# Patient Record
Sex: Female | Born: 1950 | Race: White | Hispanic: No | Smoking: Never smoker
Health system: Southern US, Community
[De-identification: ages and names within clinical notes are randomized; demographics above are authoritative.]

## PROBLEM LIST (undated history)

## (undated) DIAGNOSIS — E785 Hyperlipidemia, unspecified: Secondary | ICD-10-CM

## (undated) DIAGNOSIS — Z9221 Personal history of antineoplastic chemotherapy: Secondary | ICD-10-CM

## (undated) DIAGNOSIS — R112 Nausea with vomiting, unspecified: Secondary | ICD-10-CM

## (undated) DIAGNOSIS — M6530 Trigger finger, unspecified finger: Secondary | ICD-10-CM

## (undated) DIAGNOSIS — R519 Headache, unspecified: Secondary | ICD-10-CM

## (undated) DIAGNOSIS — R7303 Prediabetes: Secondary | ICD-10-CM

## (undated) DIAGNOSIS — C50919 Malignant neoplasm of unspecified site of unspecified female breast: Secondary | ICD-10-CM

## (undated) DIAGNOSIS — R42 Dizziness and giddiness: Secondary | ICD-10-CM

## (undated) DIAGNOSIS — T8859XA Other complications of anesthesia, initial encounter: Secondary | ICD-10-CM

## (undated) DIAGNOSIS — T4145XA Adverse effect of unspecified anesthetic, initial encounter: Secondary | ICD-10-CM

## (undated) DIAGNOSIS — C50412 Malignant neoplasm of upper-outer quadrant of left female breast: Secondary | ICD-10-CM

## (undated) DIAGNOSIS — Z923 Personal history of irradiation: Secondary | ICD-10-CM

## (undated) DIAGNOSIS — I10 Essential (primary) hypertension: Secondary | ICD-10-CM

## (undated) HISTORY — DX: Trigger finger, unspecified finger: M65.30

## (undated) HISTORY — PX: DILATION AND CURETTAGE OF UTERUS: SHX78

## (undated) HISTORY — PX: THYROIDECTOMY, PARTIAL: SHX18

## (undated) HISTORY — PX: OTHER SURGICAL HISTORY: SHX169

## (undated) HISTORY — PX: TRIGGER FINGER RELEASE: SHX641

## (undated) HISTORY — PX: OOPHORECTOMY: SHX86

## (undated) HISTORY — PX: BREAST CYST ASPIRATION: SHX578

## (undated) HISTORY — PX: CHOLECYSTECTOMY: SHX55

---
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
## 1985-11-23 DIAGNOSIS — R112 Nausea with vomiting, unspecified: Secondary | ICD-10-CM

## 1985-11-23 DIAGNOSIS — Z9889 Other specified postprocedural states: Secondary | ICD-10-CM

## 1985-11-23 HISTORY — DX: Other specified postprocedural states: R11.2

## 1985-11-23 HISTORY — DX: Other specified postprocedural states: Z98.890

## 2002-11-23 HISTORY — PX: ABDOMINAL HYSTERECTOMY: SHX81

## 2005-04-14 ENCOUNTER — Ambulatory Visit: Payer: Self-pay | Admitting: Gastroenterology

## 2005-09-22 ENCOUNTER — Ambulatory Visit: Payer: Self-pay | Admitting: Unknown Physician Specialty

## 2006-10-01 ENCOUNTER — Ambulatory Visit: Payer: Self-pay | Admitting: Unknown Physician Specialty

## 2007-09-14 ENCOUNTER — Ambulatory Visit: Payer: Self-pay | Admitting: Unknown Physician Specialty

## 2007-10-13 ENCOUNTER — Ambulatory Visit: Payer: Self-pay | Admitting: Unknown Physician Specialty

## 2008-10-16 ENCOUNTER — Ambulatory Visit: Payer: Self-pay | Admitting: Unknown Physician Specialty

## 2009-10-24 ENCOUNTER — Ambulatory Visit: Payer: Self-pay | Admitting: Unknown Physician Specialty

## 2010-08-07 ENCOUNTER — Ambulatory Visit: Payer: Self-pay | Admitting: Gastroenterology

## 2010-08-08 LAB — PATHOLOGY REPORT

## 2010-11-06 ENCOUNTER — Ambulatory Visit: Payer: Self-pay | Admitting: Unknown Physician Specialty

## 2011-11-23 ENCOUNTER — Ambulatory Visit: Payer: Self-pay | Admitting: Unknown Physician Specialty

## 2013-07-12 ENCOUNTER — Ambulatory Visit: Payer: Self-pay | Admitting: Dermatology

## 2013-08-15 DIAGNOSIS — Z860100 Personal history of colon polyps, unspecified: Secondary | ICD-10-CM | POA: Insufficient documentation

## 2014-10-01 DIAGNOSIS — E785 Hyperlipidemia, unspecified: Secondary | ICD-10-CM | POA: Insufficient documentation

## 2014-10-01 DIAGNOSIS — E1169 Type 2 diabetes mellitus with other specified complication: Secondary | ICD-10-CM | POA: Insufficient documentation

## 2014-10-01 DIAGNOSIS — N182 Chronic kidney disease, stage 2 (mild): Secondary | ICD-10-CM | POA: Insufficient documentation

## 2014-10-01 DIAGNOSIS — I152 Hypertension secondary to endocrine disorders: Secondary | ICD-10-CM | POA: Insufficient documentation

## 2014-10-01 DIAGNOSIS — E1122 Type 2 diabetes mellitus with diabetic chronic kidney disease: Secondary | ICD-10-CM | POA: Insufficient documentation

## 2014-10-01 DIAGNOSIS — E1159 Type 2 diabetes mellitus with other circulatory complications: Secondary | ICD-10-CM | POA: Insufficient documentation

## 2015-04-08 DIAGNOSIS — Z79899 Other long term (current) drug therapy: Secondary | ICD-10-CM | POA: Insufficient documentation

## 2015-10-11 DIAGNOSIS — M170 Bilateral primary osteoarthritis of knee: Secondary | ICD-10-CM | POA: Insufficient documentation

## 2015-10-11 HISTORY — DX: Morbid (severe) obesity due to excess calories: E66.01

## 2016-12-22 ENCOUNTER — Other Ambulatory Visit: Payer: Self-pay | Admitting: Obstetrics and Gynecology

## 2016-12-22 DIAGNOSIS — Z1231 Encounter for screening mammogram for malignant neoplasm of breast: Secondary | ICD-10-CM

## 2016-12-31 ENCOUNTER — Encounter: Payer: Self-pay | Admitting: Radiology

## 2016-12-31 ENCOUNTER — Ambulatory Visit
Admission: RE | Admit: 2016-12-31 | Discharge: 2016-12-31 | Disposition: A | Discharge status: Home / Self Care | Payer: Medicare Other | Source: Ambulatory Visit | Attending: Obstetrics and Gynecology | Admitting: Obstetrics and Gynecology

## 2016-12-31 DIAGNOSIS — Z1231 Encounter for screening mammogram for malignant neoplasm of breast: Secondary | ICD-10-CM | POA: Diagnosis not present

## 2017-01-06 ENCOUNTER — Inpatient Hospital Stay
Admission: RE | Admit: 2017-01-06 | Discharge: 2017-01-06 | Disposition: A | Discharge status: Home / Self Care | Payer: Self-pay | Source: Ambulatory Visit | Attending: *Deleted | Admitting: *Deleted

## 2017-01-06 ENCOUNTER — Other Ambulatory Visit: Payer: Self-pay | Admitting: *Deleted

## 2017-01-06 DIAGNOSIS — Z9289 Personal history of other medical treatment: Secondary | ICD-10-CM

## 2017-04-08 DIAGNOSIS — N2889 Other specified disorders of kidney and ureter: Secondary | ICD-10-CM | POA: Insufficient documentation

## 2017-04-08 DIAGNOSIS — N182 Chronic kidney disease, stage 2 (mild): Secondary | ICD-10-CM

## 2017-04-08 HISTORY — DX: Chronic kidney disease, stage 2 (mild): N18.2

## 2017-07-31 ENCOUNTER — Emergency Department: Payer: Medicare Other

## 2017-07-31 ENCOUNTER — Emergency Department
Admission: EM | Admit: 2017-07-31 | Discharge: 2017-07-31 | Disposition: A | Discharge status: Home / Self Care | Payer: Medicare Other | Attending: Emergency Medicine | Admitting: Emergency Medicine

## 2017-07-31 DIAGNOSIS — Y92015 Private garage of single-family (private) house as the place of occurrence of the external cause: Secondary | ICD-10-CM | POA: Diagnosis not present

## 2017-07-31 DIAGNOSIS — S066X0A Traumatic subarachnoid hemorrhage without loss of consciousness, initial encounter: Secondary | ICD-10-CM | POA: Insufficient documentation

## 2017-07-31 DIAGNOSIS — Z7982 Long term (current) use of aspirin: Secondary | ICD-10-CM | POA: Diagnosis not present

## 2017-07-31 DIAGNOSIS — Z79899 Other long term (current) drug therapy: Secondary | ICD-10-CM | POA: Diagnosis not present

## 2017-07-31 DIAGNOSIS — W010XXA Fall on same level from slipping, tripping and stumbling without subsequent striking against object, initial encounter: Secondary | ICD-10-CM | POA: Insufficient documentation

## 2017-07-31 DIAGNOSIS — Y9389 Activity, other specified: Secondary | ICD-10-CM | POA: Diagnosis not present

## 2017-07-31 DIAGNOSIS — I10 Essential (primary) hypertension: Secondary | ICD-10-CM | POA: Diagnosis not present

## 2017-07-31 DIAGNOSIS — S0990XA Unspecified injury of head, initial encounter: Secondary | ICD-10-CM | POA: Diagnosis present

## 2017-07-31 DIAGNOSIS — Y999 Unspecified external cause status: Secondary | ICD-10-CM | POA: Insufficient documentation

## 2017-07-31 DIAGNOSIS — W19XXXA Unspecified fall, initial encounter: Secondary | ICD-10-CM

## 2017-07-31 HISTORY — DX: Essential (primary) hypertension: I10

## 2017-07-31 HISTORY — DX: Hyperlipidemia, unspecified: E78.5

## 2017-07-31 LAB — PROTIME-INR
INR: 0.96
PROTHROMBIN TIME: 12.7 s (ref 11.4–15.2)

## 2017-07-31 LAB — CBC
HCT: 36.1 % (ref 35.0–47.0)
Hemoglobin: 13.1 g/dL (ref 12.0–16.0)
MCH: 30.6 pg (ref 26.0–34.0)
MCHC: 36.2 g/dL — ABNORMAL HIGH (ref 32.0–36.0)
MCV: 84.5 fL (ref 80.0–100.0)
Platelets: 308 10*3/uL (ref 150–440)
RBC: 4.28 MIL/uL (ref 3.80–5.20)
RDW: 13.4 % (ref 11.5–14.5)
WBC: 12.2 10*3/uL — AB (ref 3.6–11.0)

## 2017-07-31 LAB — BASIC METABOLIC PANEL
ANION GAP: 9 (ref 5–15)
BUN: 17 mg/dL (ref 6–20)
CO2: 26 mmol/L (ref 22–32)
Calcium: 9.7 mg/dL (ref 8.9–10.3)
Chloride: 101 mmol/L (ref 101–111)
Creatinine, Ser: 0.96 mg/dL (ref 0.44–1.00)
GFR calc Af Amer: >60 mL/min (ref >60)
GFR, EST NON AFRICAN AMERICAN: 59 mL/min — AB (ref >60)
Glucose, Bld: 138 mg/dL — ABNORMAL HIGH (ref 65–99)
POTASSIUM: 3.7 mmol/L (ref 3.5–5.1)
SODIUM: 136 mmol/L (ref 135–145)

## 2017-07-31 LAB — APTT: APTT: 31 s (ref 24–36)

## 2017-07-31 NOTE — ED Notes (Signed)
Transfer paperwork reviewed and is in order. EMS on scene.

## 2017-07-31 NOTE — ED Notes (Signed)
MD at bedside. 

## 2017-07-31 NOTE — ED Triage Notes (Signed)
Patient had mechanical fall today, hit posterior head. Patient has hematoma and abrasion to posterior head. Pt denies LOC, N/V, dizziness.

## 2017-07-31 NOTE — ED Notes (Signed)
Called [facility] Charge RN Hayes, informed her that patient leaving ED now and on the way to [facility] by [facility]

## 2017-07-31 NOTE — ED Provider Notes (Addendum)
[facility] Emergency Department Provider Note  ____________________________________________  Time seen: Approximately 8:14 PM  I have reviewed the triage vital signs and the nursing notes.   HISTORY  Chief Complaint Fall (hematoma posterior head)   HPI Amanda Wade is a 66 y.o. female with a history of hypertension and hyperlipidemia who presents for evaluation of head trauma. Patient was wearing her crocs getting into the garage from her car, the floor is wet because the straining she slipped and fell backwards hitting her head on the ground. She denies LOC. She does report a brief episode of vertigo right after her fall. She is on an aspirin but no other blood thinners. She is complaining of moderate pain located in the back of her head that has been constant and nonradiating. She denies neck pain, back pain, chest pain, shortness of breath, extremity pain, abdominal pain.  Past Medical History:  Diagnosis Date  . Hyperlipidemia   . Hypertension     There are no active problems to display for this patient.   Past Surgical History:  Procedure Laterality Date  . ABDOMINAL HYSTERECTOMY  2004   complete  . BREAST CYST ASPIRATION Bilateral    neg  . CHOLECYSTECTOMY    . OOPHORECTOMY      Prior to Admission medications   Medication Sig Start Date End Date Taking? Authorizing Provider  Atorvastatin Calcium (LIPITOR PO) Take by mouth.   Yes [provider]  LISINOPRIL PO Take by mouth.   Yes [provider]    Allergies Sulfa antibiotics  Family History  Problem Relation Age of Onset  . Breast cancer Cousin        mat cousin    Social History Social History  Substance Use Topics  . Smoking status: Never Smoker  . Smokeless tobacco: Never Used  . Alcohol use No    Review of Systems Constitutional: Negative for fever. Eyes: Negative for visual changes. ENT: Negative for facial injury or neck  injury Cardiovascular: Negative for chest injury. Respiratory: Negative for shortness of breath. Negative for chest wall injury. Gastrointestinal: Negative for abdominal pain or injury. Genitourinary: Negative for dysuria. Musculoskeletal: Negative for back injury, negative for arm or leg pain. Skin: Negative for laceration/abrasions. Neurological: + head injury.   ____________________________________________   PHYSICAL EXAM:  VITAL SIGNS: ED Triage Vitals  Enc Vitals Group     BP 07/31/17 1927 (!) 152/62     Pulse Rate 07/31/17 1927 83     Resp 07/31/17 1927 17     Temp 07/31/17 1927 99.5 F (37.5 C)     Temp Source 07/31/17 1927 Oral     SpO2 07/31/17 1927 97 %     Weight 07/31/17 1927 179 lb (81.2 kg)     Height 07/31/17 1927 5' 1"  (1.549 m)     Head Circumference --      Peak Flow --      Pain Score 07/31/17 1926 2     Pain Loc --      Pain Edu? --      Excl. in [place]? --    Constitutional: Alert and oriented. No acute distress. Does not appear intoxicated. HEENT Head: Normocephalic and Left parietal hematoma Face: No facial bony tenderness. Stable midface Ears: No hemotympanum bilaterally. No Battle sign Eyes: No eye injury. PERRL. No raccoon eyes Nose: Nontender. No epistaxis. No rhinorrhea Mouth/Throat: Mucous membranes are moist. No oropharyngeal blood. No dental injury. Airway patent without stridor. Normal voice. Neck:  no C-collar in place. No midline c-spine tenderness.  Cardiovascular: Normal rate, regular rhythm. Normal and symmetric distal pulses are present in all extremities. Pulmonary/Chest: Chest wall is stable and nontender to palpation/compression. Normal respiratory effort. Breath sounds are normal. No crepitus.  Abdominal: Soft, nontender, non distended. Musculoskeletal: Nontender with normal full range of motion in all extremities. No deformities. No thoracic or lumbar midline spinal tenderness. Pelvis is stable. Skin: Skin is warm, dry and intact.  No abrasions or contutions. Psychiatric: Speech and behavior are appropriate. Neurological: Normal speech and language. Moves all extremities to command. No gross focal neurologic deficits are appreciated.  Glascow Coma Score: 4 - Opens eyes on own 6 - Follows simple motor commands 5 - Alert and oriented GCS: 15   ____________________________________________   LABS (all labs ordered are listed, but only abnormal results are displayed)  Labs Reviewed  CBC  BASIC METABOLIC PANEL  PROTIME-INR  APTT   ____________________________________________  EKG  ED ECG REPORT I, Rudene Re, the attending physician, personally viewed and interpreted this ECG.  Normal sinus rhythm, rate of 76, normal intervals, left axis deviation, no ST elevations or depressions.  ____________________________________________  RADIOLOGY  CT head and cspine:  1. Moderate left posterior parietal scalp hematoma. No underlying skull fracture, however there is a small amount of subarachnoid hemorrhage within the left parietal lobe near the vertex. 2. Mild atrophy 3. Straightening of the cervical spine with degenerative changes. No acute fracture ____________________________________________   PROCEDURES  Procedure(s) performed: None Procedures Critical Care performed: yes  CRITICAL CARE Performed by: Rudene Re  ?  Total critical care time: 40 min  Critical care time was exclusive of separately billable procedures and treating other patients.  Critical care was necessary to treat or prevent imminent or life-threatening deterioration.  Critical care was time spent personally by me on the following activities: development of treatment plan with patient and/or surrogate as well as nursing, discussions with consultants, evaluation of patient's response to treatment, examination of patient, obtaining history from patient or surrogate, ordering and performing treatments and interventions,  ordering and review of laboratory studies, ordering and review of radiographic studies, pulse oximetry and re-evaluation of patient's condition.  ____________________________________________   INITIAL IMPRESSION / ASSESSMENT AND PLAN / ED COURSE   66 y.o. female with a history of hypertension and hyperlipidemia who presents for evaluation of head trauma after slipping on wet floor and hitting her head in the back. GCS 15, large parietal hematoma with overlying abrasion but no laceration. No midline cspine ttp. CT head concerning for small amount of SAH. Will call [facility] for transfer.    _________________________ 8:32 PM on 07/31/2017 -----------------------------------------  Patient accepted at [facility] by Dr. Marcos Eke, neurosurgeon on call. Patient remains with a GCS of 15, blood pressure of 123/70 not requiring any IV medication at this time. Head of the bed elevated. Patient will be monitored carefully until and EMS is here for transportation.  Pertinent labs & imaging results that were available during my care of the patient were reviewed by me and considered in my medical decision making (see chart for details).    ____________________________________________   FINAL CLINICAL IMPRESSION(S) / ED DIAGNOSES  Final diagnoses:  Fall, initial encounter  Subarachnoid hemorrhage following injury, no loss of consciousness, initial encounter ([place])      NEW MEDICATIONS STARTED DURING THIS VISIT:  New Prescriptions   No medications on file     Note:  This document was prepared using Dragon voice recognition software and  may include unintentional dictation errors.    Rudene Re, MD 07/31/17 2033    Rudene Re, MD 07/31/17 [phone]

## 2017-11-09 DIAGNOSIS — M19071 Primary osteoarthritis, right ankle and foot: Secondary | ICD-10-CM | POA: Insufficient documentation

## 2018-01-05 ENCOUNTER — Other Ambulatory Visit: Payer: Self-pay | Admitting: Obstetrics & Gynecology

## 2018-01-05 DIAGNOSIS — Z1239 Encounter for other screening for malignant neoplasm of breast: Secondary | ICD-10-CM

## 2018-01-10 ENCOUNTER — Ambulatory Visit
Admission: RE | Admit: 2018-01-10 | Discharge: 2018-01-10 | Disposition: A | Discharge status: Home / Self Care | Payer: Medicare Other | Source: Ambulatory Visit | Attending: Obstetrics & Gynecology | Admitting: Obstetrics & Gynecology

## 2018-01-10 DIAGNOSIS — Z1239 Encounter for other screening for malignant neoplasm of breast: Secondary | ICD-10-CM

## 2018-01-10 DIAGNOSIS — Z1231 Encounter for screening mammogram for malignant neoplasm of breast: Secondary | ICD-10-CM | POA: Insufficient documentation

## 2018-08-01 ENCOUNTER — Encounter: Payer: Self-pay | Admitting: *Deleted

## 2018-08-02 ENCOUNTER — Ambulatory Visit: Payer: Medicare Other | Admitting: Anesthesiology

## 2018-08-02 ENCOUNTER — Encounter
Admission: RE | Disposition: A | Discharge status: Home / Self Care | Payer: Self-pay | Source: Ambulatory Visit | Attending: Gastroenterology

## 2018-08-02 ENCOUNTER — Encounter: Payer: Self-pay | Admitting: Anesthesiology

## 2018-08-02 ENCOUNTER — Ambulatory Visit
Admission: RE | Admit: 2018-08-02 | Discharge: 2018-08-02 | Disposition: A | Discharge status: Home / Self Care | Payer: Medicare Other | Source: Ambulatory Visit | Attending: Gastroenterology | Admitting: Gastroenterology

## 2018-08-02 DIAGNOSIS — Z882 Allergy status to sulfonamides status: Secondary | ICD-10-CM | POA: Insufficient documentation

## 2018-08-02 DIAGNOSIS — Z86010 Personal history of colonic polyps: Secondary | ICD-10-CM | POA: Insufficient documentation

## 2018-08-02 DIAGNOSIS — I10 Essential (primary) hypertension: Secondary | ICD-10-CM | POA: Insufficient documentation

## 2018-08-02 DIAGNOSIS — Z800 Family history of malignant neoplasm of digestive organs: Secondary | ICD-10-CM | POA: Diagnosis not present

## 2018-08-02 DIAGNOSIS — Z79899 Other long term (current) drug therapy: Secondary | ICD-10-CM | POA: Diagnosis not present

## 2018-08-02 DIAGNOSIS — E785 Hyperlipidemia, unspecified: Secondary | ICD-10-CM | POA: Diagnosis not present

## 2018-08-02 DIAGNOSIS — K5730 Diverticulosis of large intestine without perforation or abscess without bleeding: Secondary | ICD-10-CM | POA: Insufficient documentation

## 2018-08-02 DIAGNOSIS — D123 Benign neoplasm of transverse colon: Secondary | ICD-10-CM | POA: Diagnosis not present

## 2018-08-02 DIAGNOSIS — Z7982 Long term (current) use of aspirin: Secondary | ICD-10-CM | POA: Insufficient documentation

## 2018-08-02 HISTORY — PX: COLONOSCOPY WITH PROPOFOL: SHX5780

## 2018-08-02 SURGERY — COLONOSCOPY WITH PROPOFOL
Anesthesia: General

## 2018-08-02 MED ORDER — SODIUM CHLORIDE 0.9 % IV SOLN
INTRAVENOUS | Status: DC
  Administered 2018-08-02: 1000 mL via INTRAVENOUS

## 2018-08-02 MED ORDER — LIDOCAINE HCL (CARDIAC) PF 100 MG/5ML IV SOSY
PREFILLED_SYRINGE | INTRAVENOUS | Status: DC | PRN
  Administered 2018-08-02: 100 mg via INTRAVENOUS

## 2018-08-02 MED ORDER — PROPOFOL 500 MG/50ML IV EMUL
INTRAVENOUS | Status: DC | PRN
  Administered 2018-08-02: 150 ug/kg/min via INTRAVENOUS

## 2018-08-02 MED ORDER — LIDOCAINE HCL (PF) 1 % IJ SOLN
2.0000 mL | Freq: Once | INTRAMUSCULAR | Status: AC
  Administered 2018-08-02: 0.3 mL via INTRADERMAL

## 2018-08-02 MED ORDER — LIDOCAINE HCL (PF) 2 % IJ SOLN
INTRAMUSCULAR | Status: AC
  Filled 2018-08-02: qty 10

## 2018-08-02 MED ORDER — LIDOCAINE HCL (PF) 1 % IJ SOLN
INTRAMUSCULAR | Status: AC
  Administered 2018-08-02: 0.3 mL via INTRADERMAL
  Filled 2018-08-02: qty 2

## 2018-08-02 MED ORDER — PROPOFOL 10 MG/ML IV BOLUS
INTRAVENOUS | Status: DC | PRN
  Administered 2018-08-02: 90 mg via INTRAVENOUS

## 2018-08-02 MED ORDER — PROPOFOL 500 MG/50ML IV EMUL
INTRAVENOUS | Status: AC
  Filled 2018-08-02: qty 50

## 2018-08-02 NOTE — Anesthesia Post-op Follow-up Note (Signed)
Anesthesia QCDR form completed.        

## 2018-08-02 NOTE — Op Note (Signed)
[facility] Patient Name: Amanda Wade Procedure Date: 08/02/2018 1:22 PM MRN: [id number] Account #: [id number] Date of Birth: [birth year] Admit Type: Outpatient Age: 67 Room: [facility] ENDO ROOM 3 Gender: Female Note Status: Finalized Procedure:            Colonoscopy Indications:          Personal history of colonic polyps Providers:            [name], MD Referring MD:         Christena Flake. Raechel Ache, MD (Referring MD) Medicines:            Monitored Anesthesia Care Complications:        No immediate complications. Procedure:            Pre-Anesthesia Assessment:                       - ASA Grade Assessment: III - A patient with severe                        systemic disease.                       After obtaining informed consent, the colonoscope was                        passed under direct vision. Throughout the procedure,                        the patient's blood pressure, pulse, and oxygen                        saturations were monitored continuously. The                        Colonoscope was introduced through the anus and                        advanced to the the cecum, identified by appendiceal                        orifice and ileocecal valve. The colonoscopy was                        performed without difficulty. The patient tolerated the                        procedure well. The quality of the bowel preparation                        was fair. Findings:      Multiple medium-mouthed diverticula were found in the sigmoid colon,       descending colon, transverse colon, distal transverse colon and       ascending colon.      A 3 mm polyp was found in the transverse colon. The polyp was sessile.       The polyp was removed with a cold biopsy forceps. Resection and       retrieval were complete.      Two sessile polyps were found in the hepatic flexure. The polyps were 3       mm in size. These polyps  were removed with a cold  biopsy forceps.       Resection and retrieval were complete.      The retroflexed view of the distal rectum and anal verge was normal and       showed no anal or rectal abnormalities.      The digital rectal exam was normal. Impression:           - Preparation of the colon was fair.                       - Diverticulosis in the sigmoid colon, in the                        descending colon, in the transverse colon, in the                        distal transverse colon and in the ascending colon.                       - One 3 mm polyp in the transverse colon, removed with                        a cold biopsy forceps. Resected and retrieved.                       - Two 3 mm polyps at the hepatic flexure, removed with                        a cold biopsy forceps. Resected and retrieved.                       - The distal rectum and anal verge are normal on                        retroflexion view. Recommendation:       - Discharge patient to home.                       - Telephone GI clinic for pathology results in 1 week. Procedure Code(s):    --- Professional ---                       [phone], Colonoscopy, flexible; with biopsy, single or                        multiple Diagnosis Code(s):    --- Professional ---                       D12.3, Benign neoplasm of transverse colon (hepatic                        flexure or splenic flexure)                       Z86.010, Personal history of colonic polyps                       K57.30, Diverticulosis of large intestine without  perforation or abscess without bleeding CPT copyright 2017 American Medical Association. All rights reserved. The codes documented in this report are preliminary and upon coder review may  be revised to meet current compliance requirements. [name], MD 08/02/2018 1:55:15 PM This report has been signed electronically. Number of Addenda: 0 Note Initiated On: 08/02/2018 1:22 PM Scope  Withdrawal Time: 0 hours 12 minutes 20 seconds  Total Procedure Duration: 0 hours 23 minutes 45 seconds       [facility]

## 2018-08-02 NOTE — Transfer of Care (Signed)
Immediate Anesthesia Transfer of Care Note  Patient: Amanda Wade  Procedure(s) Performed: COLONOSCOPY WITH PROPOFOL (N/A )  Patient Location: Endoscopy Unit  Anesthesia Type:General  Level of Consciousness: awake and alert   Airway & Oxygen Therapy: Patient Spontanous Breathing and Patient connected to nasal cannula oxygen  Post-op Assessment: Report given to RN and Post -op Vital signs reviewed and stable  Post vital signs: Reviewed and stable  Last Vitals:  Vitals Value Taken Time  BP 91/43 08/02/2018  1:57 PM  Temp 35.9 C 08/02/2018  1:55 PM  Pulse 73 08/02/2018  1:57 PM  Resp 14 08/02/2018  1:57 PM  SpO2 98 % 08/02/2018  1:57 PM  Vitals shown include unvalidated device data.  Last Pain:  Vitals:   08/02/18 1355  TempSrc: Tympanic  PainSc: 0-No pain         Complications: No apparent anesthesia complications

## 2018-08-02 NOTE — H&P (Signed)
Outpatient short stay form Pre-procedure 08/02/2018 1:05 PM [name] MD  Primary Physician: Amanda Churn,  MD  Reason for visit: Colonoscopy  History of present illness: Patient is a 67 year old female presenting today for colonoscopy in regards her personal history of adenomatous colon polyps.  She also has a history of colon cancer in her father.  His last colonoscopy was about 5 years ago.  Takes a 81 mg aspirin that was held she takes no other aspirin products or blood thinning agent.    Current Facility-Administered Medications:  .  0.9 %  sodium chloride infusion, , Intravenous, Continuous, [name], MD, Last Rate: 20 mL/hr at 08/02/18 1247, 1,000 mL at 08/02/18 1247  Medications Prior to Admission  Medication Sig Dispense Refill Last Dose  . aspirin EC 81 MG tablet Take 81 mg by mouth daily.   07/30/2018  . CALCIUM CARBONATE-VITAMIN D PO Take 1,500 mg by mouth daily.     Marland Kitchen lisinopril-hydrochlorothiazide (PRINZIDE,ZESTORETIC) 20-12.5 MG tablet Take 1 tablet by mouth daily.   08/02/2018 at 0500  . Atorvastatin Calcium (LIPITOR PO) Take by mouth.   08/02/2018 at 0500     Allergies  Allergen Reactions  . Sulfa Antibiotics Rash     Past Medical History:  Diagnosis Date  . Hyperlipidemia   . Hypertension     Review of systems:      Physical Exam    Heart and lungs: Regular rate and rhythm without rub or gallop, lungs are bilaterally clear.    HEENT: Normocephalic atraumatic eyes are anicteric    Other:    Pertinant exam for procedure: Soft nontender nondistended bowel sounds positive normoactive.    Planned proceedures: Colonoscopy and indicated procedures. I have discussed the risks benefits and complications of procedures to include not limited to bleeding, infection, perforation and the risk of sedation and the patient wishes to proceed.    [name], MD Gastroenterology 08/02/2018  1:05 PM

## 2018-08-02 NOTE — Anesthesia Preprocedure Evaluation (Signed)
Anesthesia Evaluation  Patient identified by MRN, date of birth, ID band Patient awake    Reviewed: Allergy & Precautions, H&P , NPO status , Patient's Chart, lab work & pertinent test results, reviewed documented beta blocker date and time   History of Anesthesia Complications Negative for: history of anesthetic complications  Airway Mallampati: IV  TM Distance: >3 FB Neck ROM: full    Dental  (+) Caps, Dental Advidsory Given, Teeth Intact   Pulmonary neg pulmonary ROS,           Cardiovascular Exercise Tolerance: Good hypertension, (-) angina(-) CAD, (-) Past MI, (-) Cardiac Stents and (-) CABG (-) dysrhythmias (-) Valvular Problems/Murmurs     Neuro/Psych negative neurological ROS  negative psych ROS   GI/Hepatic negative GI ROS, Neg liver ROS,   Endo/Other  negative endocrine ROS  Renal/GU negative Renal ROS  negative genitourinary   Musculoskeletal   Abdominal   Peds  Hematology negative hematology ROS (+)   Anesthesia Other Findings Past Medical History: No date: Hyperlipidemia No date: Hypertension   Reproductive/Obstetrics negative OB ROS                             Anesthesia Physical Anesthesia Plan  ASA: III  Anesthesia Plan: General   Post-op Pain Management:    Induction: Intravenous  PONV Risk Score and Plan: 3 and Propofol infusion and TIVA  Airway Management Planned: Natural Airway and Nasal Cannula  Additional Equipment:   Intra-op Plan:   Post-operative Plan:   Informed Consent: I have reviewed the patients History and Physical, chart, labs and discussed the procedure including the risks, benefits and alternatives for the proposed anesthesia with the patient or authorized representative who has indicated his/her understanding and acceptance.   Dental Advisory Given  Plan Discussed with: Anesthesiologist, CRNA and Surgeon  Anesthesia Plan Comments:          Anesthesia Quick Evaluation

## 2018-08-03 ENCOUNTER — Encounter: Payer: Self-pay | Admitting: Gastroenterology

## 2018-08-03 NOTE — Anesthesia Postprocedure Evaluation (Signed)
Anesthesia Post Note  Patient: Amanda Wade  Procedure(s) Performed: COLONOSCOPY WITH PROPOFOL (N/A )  Patient location during evaluation: Endoscopy Anesthesia Type: General Level of consciousness: awake and alert Pain management: pain level controlled Vital Signs Assessment: post-procedure vital signs reviewed and stable Respiratory status: spontaneous breathing, nonlabored ventilation, respiratory function stable and patient connected to nasal cannula oxygen Cardiovascular status: blood pressure returned to baseline and stable Postop Assessment: no apparent nausea or vomiting Anesthetic complications: no     Last Vitals:  Vitals:   08/02/18 1415 08/02/18 1425  BP: (!) 122/49 (!) 125/53  Pulse:    Resp:    Temp:    SpO2:      Last Pain:  Vitals:   08/03/18 0745  TempSrc:   PainSc: 0-No pain                 Martha Clan

## 2018-08-04 LAB — SURGICAL PATHOLOGY

## 2019-04-11 ENCOUNTER — Other Ambulatory Visit: Payer: Self-pay | Admitting: Obstetrics & Gynecology

## 2019-04-11 DIAGNOSIS — Z1231 Encounter for screening mammogram for malignant neoplasm of breast: Secondary | ICD-10-CM

## 2019-04-28 ENCOUNTER — Ambulatory Visit
Admission: RE | Admit: 2019-04-28 | Discharge: 2019-04-28 | Disposition: A | Discharge status: Home / Self Care | Payer: Medicare Other | Source: Ambulatory Visit | Attending: Obstetrics & Gynecology | Admitting: Obstetrics & Gynecology

## 2019-04-28 ENCOUNTER — Other Ambulatory Visit: Payer: Self-pay

## 2019-04-28 DIAGNOSIS — Z1231 Encounter for screening mammogram for malignant neoplasm of breast: Secondary | ICD-10-CM | POA: Insufficient documentation

## 2019-05-02 ENCOUNTER — Other Ambulatory Visit: Payer: Self-pay | Admitting: Obstetrics & Gynecology

## 2019-05-02 ENCOUNTER — Ambulatory Visit
Admission: RE | Admit: 2019-05-02 | Discharge: 2019-05-02 | Disposition: A | Discharge status: Home / Self Care | Payer: Medicare Other | Source: Ambulatory Visit | Attending: Obstetrics & Gynecology | Admitting: Obstetrics & Gynecology

## 2019-05-02 ENCOUNTER — Other Ambulatory Visit: Payer: Self-pay

## 2019-05-02 DIAGNOSIS — N6320 Unspecified lump in the left breast, unspecified quadrant: Secondary | ICD-10-CM | POA: Insufficient documentation

## 2019-05-02 DIAGNOSIS — R928 Other abnormal and inconclusive findings on diagnostic imaging of breast: Secondary | ICD-10-CM | POA: Diagnosis not present

## 2019-05-04 ENCOUNTER — Other Ambulatory Visit: Payer: Self-pay | Admitting: Obstetrics & Gynecology

## 2019-05-04 DIAGNOSIS — N6320 Unspecified lump in the left breast, unspecified quadrant: Secondary | ICD-10-CM

## 2019-05-04 DIAGNOSIS — R928 Other abnormal and inconclusive findings on diagnostic imaging of breast: Secondary | ICD-10-CM

## 2019-05-05 ENCOUNTER — Ambulatory Visit
Admission: RE | Admit: 2019-05-05 | Discharge: 2019-05-05 | Disposition: A | Discharge status: Home / Self Care | Payer: Medicare Other | Source: Ambulatory Visit | Attending: Obstetrics & Gynecology | Admitting: Obstetrics & Gynecology

## 2019-05-05 ENCOUNTER — Other Ambulatory Visit: Payer: Self-pay

## 2019-05-05 DIAGNOSIS — N6320 Unspecified lump in the left breast, unspecified quadrant: Secondary | ICD-10-CM

## 2019-05-05 DIAGNOSIS — R928 Other abnormal and inconclusive findings on diagnostic imaging of breast: Secondary | ICD-10-CM | POA: Diagnosis not present

## 2019-05-05 HISTORY — PX: BREAST BIOPSY: SHX20

## 2019-05-08 ENCOUNTER — Ambulatory Visit: Payer: Medicare Other

## 2019-05-08 ENCOUNTER — Other Ambulatory Visit: Payer: Self-pay | Admitting: Anatomic Pathology & Clinical Pathology

## 2019-05-09 ENCOUNTER — Encounter: Payer: Self-pay | Admitting: *Deleted

## 2019-05-09 ENCOUNTER — Other Ambulatory Visit: Payer: Self-pay | Admitting: Obstetrics & Gynecology

## 2019-05-09 DIAGNOSIS — N6320 Unspecified lump in the left breast, unspecified quadrant: Secondary | ICD-10-CM

## 2019-05-09 DIAGNOSIS — R928 Other abnormal and inconclusive findings on diagnostic imaging of breast: Secondary | ICD-10-CM

## 2019-05-09 DIAGNOSIS — C50912 Malignant neoplasm of unspecified site of left female breast: Secondary | ICD-10-CM

## 2019-05-09 NOTE — Progress Notes (Signed)
Called patient to establish navigation services.  She is newly diagnosed with invasive left breast cancer.  Patient had been informed of her diagnosis by the radiology and informed one of the navigators would call to coordinate her care.  Patient is waiting to get scheduled for a second breast biopsy, and on a phone call from Dr. Leonides Schanz.  I have scheduled her see Dr. Bary Castilla per her request, next week on 05/18/19 @ 10:00 and Dr. Janese Banks at 1:30.  Hopefully her second biopsy will be completed by that time.  Will [employer]  patient breast cancer educational literature, "My Breast Cancer Treatment Handbook" by Josephine Igo, RN.  Patient is to call with any questions or needs.

## 2019-05-11 LAB — SURGICAL PATHOLOGY

## 2019-05-16 ENCOUNTER — Other Ambulatory Visit: Payer: Self-pay | Admitting: Obstetrics & Gynecology

## 2019-05-16 ENCOUNTER — Ambulatory Visit
Admission: RE | Admit: 2019-05-16 | Discharge: 2019-05-16 | Disposition: A | Discharge status: Home / Self Care | Payer: Medicare Other | Source: Ambulatory Visit | Attending: Obstetrics & Gynecology | Admitting: Obstetrics & Gynecology

## 2019-05-16 ENCOUNTER — Other Ambulatory Visit: Payer: Self-pay

## 2019-05-16 DIAGNOSIS — N6320 Unspecified lump in the left breast, unspecified quadrant: Secondary | ICD-10-CM | POA: Diagnosis present

## 2019-05-16 DIAGNOSIS — R928 Other abnormal and inconclusive findings on diagnostic imaging of breast: Secondary | ICD-10-CM

## 2019-05-16 HISTORY — PX: BREAST BIOPSY: SHX20

## 2019-05-17 ENCOUNTER — Other Ambulatory Visit: Payer: Self-pay

## 2019-05-17 ENCOUNTER — Telehealth: Payer: Self-pay | Admitting: Oncology

## 2019-05-17 NOTE — Telephone Encounter (Signed)
Spoke with pt to confirm appt date/time, do pre-appt screen which was completed, and adv of Covid-19 guidelines for appt regarding screening questions, temperature check, face mask required, and no visitors allowed

## 2019-05-18 ENCOUNTER — Encounter: Payer: Self-pay | Admitting: *Deleted

## 2019-05-18 ENCOUNTER — Encounter: Payer: Self-pay | Admitting: General Surgery

## 2019-05-18 ENCOUNTER — Ambulatory Visit (INDEPENDENT_AMBULATORY_CARE_PROVIDER_SITE_OTHER): Payer: Medicare Other | Admitting: General Surgery

## 2019-05-18 ENCOUNTER — Encounter: Payer: Self-pay | Admitting: Oncology

## 2019-05-18 ENCOUNTER — Other Ambulatory Visit: Payer: Self-pay

## 2019-05-18 ENCOUNTER — Inpatient Hospital Stay: Payer: Medicare Other | Attending: Oncology | Admitting: Oncology

## 2019-05-18 VITALS — BP 123/88 | HR 87 | Temp 98.8°F | Resp 20 | Ht 62.0 in | Wt 209.0 lb

## 2019-05-18 VITALS — BP 157/81 | HR 83 | Temp 98.1°F | Resp 16 | Ht 62.0 in | Wt 209.6 lb

## 2019-05-18 DIAGNOSIS — Z7189 Other specified counseling: Secondary | ICD-10-CM

## 2019-05-18 DIAGNOSIS — E785 Hyperlipidemia, unspecified: Secondary | ICD-10-CM | POA: Insufficient documentation

## 2019-05-18 DIAGNOSIS — C50412 Malignant neoplasm of upper-outer quadrant of left female breast: Secondary | ICD-10-CM

## 2019-05-18 DIAGNOSIS — Z79899 Other long term (current) drug therapy: Secondary | ICD-10-CM | POA: Diagnosis not present

## 2019-05-18 DIAGNOSIS — Z170 Estrogen receptor positive status [ER+]: Secondary | ICD-10-CM | POA: Diagnosis not present

## 2019-05-18 DIAGNOSIS — Z803 Family history of malignant neoplasm of breast: Secondary | ICD-10-CM | POA: Diagnosis not present

## 2019-05-18 DIAGNOSIS — I10 Essential (primary) hypertension: Secondary | ICD-10-CM | POA: Diagnosis not present

## 2019-05-18 DIAGNOSIS — Z7982 Long term (current) use of aspirin: Secondary | ICD-10-CM | POA: Diagnosis not present

## 2019-05-18 LAB — SURGICAL PATHOLOGY

## 2019-05-18 NOTE — Progress Notes (Signed)
Met patient today during her medical oncology consult with Dr. Janese Banks.  She saw Dr. Bary Castilla earlier today and is deciding to go with a lumpectomy as a surgical option.  Patient is going out of town to [place] and will be back in a couple of weeks.  Dr. Janese Banks will follow up with her post surgery.  She is to call with any questions or needs.

## 2019-05-18 NOTE — Patient Instructions (Signed)
Breast Cancer, Female  Breast cancer is a malignant growth of tissue (tumor) in the breast. Unlike noncancerous (benign) tumors, malignant tumors are cancerous and can spread to other parts of the body. The two most common types of breast cancer start in the milk ducts (ductal carcinoma) or in the lobules where milk is made in the breast (lobular carcinoma). Breast cancer is one of the most common types of cancer in women. What are the causes? The exact cause of female breast cancer is unknown. What increases the risk? The following factors may make you more likely to develop this condition:  Being older than 70 years of age.  Race and ethnicity. Caucasian women generally have an increased risk, but African-American women are more likely to develop the disease before age 40.  Having a family history of breast cancer.  Having had breast cancer in the past.  Having certain noncancerous conditions of the breast, such as dense breast tissue.  Having the BRCA1 and BRCA2 genes.  Having a history of radiation exposure.  Obesity.  Starting menopause after age 65.  Starting your menstrual periods before age 37.  Having never been pregnant or having your first child after age 22.  Having never breastfed.  Using hormone therapy after menopause.  Using birth control pills.  Drinking more than one alcoholic drink a day.  Exposure to the drug DES, which was given to pregnant women from the 1940s to the 1970s. What are the signs or symptoms? Symptoms of this condition include:  A painless lump or thickening in your breast.  Changes in the size or shape of your breast.  Breast skin changes, such as puckering or dimpling.  Nipple abnormalities, such as scaling, crustiness, redness, or pulling in (retraction).  Nipple discharge that is bloody or clear. How is this diagnosed? This condition may be diagnosed by:  Taking your medical history and doing a physical exam. During the  exam, your health care provider will feel the tissue around your breast and under your arms.  Taking a sample of nipple discharge. The sample will be examined under a microscope.  Performing imaging tests, such as breast X-rays (mammogram), breast ultrasound exams, or an MRI.  Taking a tissue sample (biopsy) from the breast. The sample will be examined under a microscope to look for cancer cells.  Taking a sample from the lymph nodes near the affected breast (sentinel node biopsy). Your cancer will be staged to determine its severity and extent. Staging is a careful attempt to find out the size of the tumor, whether the cancer has spread, and if so, to what parts of the body. Staging also includes testing your tumor for certain receptors, such as estrogen, progesterone, and human epidermal growth factor receptor 2 (HER2). This will help your cancer care team decide on a treatment that will work best for you. You may need to have more tests to determine the stage of your cancer. Stages include the following:  Stage 0-The tumor has not spread to other breast tissue.  Stage I-The cancer is only found in the breast or may be in the lymph nodes. The tumor may be up to  in (2 cm) wide.  Stage II-The cancer has spread to nearby lymph nodes. The tumor may be up to 2 in (5 cm) wide.  Stage III-The cancer has spread to more distant lymph nodes. The tumor may be larger than 2 in (5 cm) wide.  Stage IV-The cancer has spread to other parts of  the body, such as the bones, brain, liver, or lungs. How is this treated? Treatment for this condition depends on the type and stage of the breast cancer. It may be treated with:  Surgery. This may involve breast-conserving surgery (lumpectomy or partial mastectomy) in which only the part of the breast containing the cancer is removed. Some normal tissue surrounding this area may also be removed. In some cases, surgery may be done to remove the entire breast  (mastectomy) and nipple. Lymph nodes may also be removed.  Radiation therapy, which uses high-energy rays to kill cancer cells.  Chemotherapy, which is the use of drugs to kill cancer cells.  Hormone therapy, which involves taking medicine to adjust the hormone levels in your body. You may take medicine to decrease your estrogen levels. This can help stop cancer cells from growing.  Targeted therapy, in which drugs are used to block the growth and spread of cancer cells. These drugs target a specific part of the cancer cell and usually cause fewer side effects than chemotherapy. Targeted therapy may be used alone or in combination with chemotherapy.  A combination of surgery, radiation, chemotherapy, or hormone therapy may be needed to treat breast cancer. Follow these instructions at home:  Take over-the-counter and prescription medicines only as told by your health care provider.  Eat a healthy diet. A healthy diet includes lots of fruits and vegetables, low-fat dairy products, lean meats, and fiber. ? Make sure half your plate is filled with fruits or vegetables. ? Choose high-fiber foods such as whole-grain breads and cereals.  Consider joining a support group. This may help you learn to cope with the stress of having breast cancer.  Talk to your health care team about exercise and physical activity. The right exercise program can: ? Help prevent or reduce symptoms such as fatigue or depression. ? Improve overall health and survival rates.  Keep all follow-up visits as told by your health care provider. This is important. Where to find more information  American Cancer Society: www.cancer.[place]: www.cancer.gov Contact a health care provider if:  You have a sudden increase in pain.  You have any symptoms or changes that concern you.  You lose weight without trying.  You notice a new lump in either breast or under your arm.  You develop swelling in  either arm or hand.  You have a fever.  You notice new fatigue or weakness. Get help right away if:  You have chest pain or trouble breathing.  You faint. Summary  Breast cancer is a malignant growth of tissue (tumor) in the breast.  Your cancer will be staged to determine its severity and extent.  Treatment for this condition depends on the type and stage of the breast cancer. This information is not intended to replace advice given to you by your health care provider. Make sure you discuss any questions you have with your health care provider. Document Released: 02/17/2006 Document Revised: 07/05/2017 Document Reviewed: 07/05/2017 Elsevier Interactive Patient Education  [employer].

## 2019-05-18 NOTE — Progress Notes (Signed)
Patient ID: Amanda Wade, female   DOB: Dec 16, 1950, 68 y.o.   MRN: [id number]  Chief Complaint  Patient presents with  . New Patient (Initial Visit)    HPI Amanda Wade is a 68 y.o. female who presents for a breast evaluation. The most recent screening mammogram was done on 04/28/19. She had additional views of the left breast on 05/02/19.  She had a left breast biopsy done on 05/05/19. She had another three biopsies of the left on 05/16/19.   Prior to this she had not noticed any prior problems with her breasts. Patient does perform regular self breast checks and gets regular mammograms done.    She is here today with her friend, Amanda Wade.   HPI  Past Medical History:  Diagnosis Date  . Hyperlipidemia   . Hypertension   . Malignant neoplasm of upper-outer quadrant of left breast in female, estrogen receptor positive ([place]) 05/19/2019    Past Surgical History:  Procedure Laterality Date  . ABDOMINAL HYSTERECTOMY  2004   complete  . BREAST BIOPSY Left 05/05/2019   [place] bx path pending ribbon clip  . BREAST BIOPSY Left 05/16/2019   [place] path pending   . BREAST BIOPSY Left 05/16/2019   Affirm Biopsy- X clip  . BREAST CYST ASPIRATION Bilateral age 85   neg  . CHOLECYSTECTOMY    . COLONOSCOPY WITH PROPOFOL N/A 08/02/2018   Procedure: COLONOSCOPY WITH PROPOFOL;  Surgeon: [name], MD;  Location: [facility] ENDOSCOPY;  Service: Endoscopy;  Laterality: N/A;  . DILATION AND CURETTAGE OF UTERUS    . goiter surgery    . OOPHORECTOMY    . THYROIDECTOMY, PARTIAL      Family History  Problem Relation Age of Onset  . Breast cancer Cousin        mat cousin  . Hypertension Mother   . Heart attack Father   . Prostate cancer Father   . Diabetes Father   . Colon polyps Father     Social History Social History   Tobacco Use  . Smoking status: Never Smoker  . Smokeless tobacco: Never Used  Substance Use Topics  . Alcohol use: No  . Drug use: No    Allergies  Allergen  Reactions  . Shellfish Allergy Rash  . Sulfa Antibiotics Rash    Current Outpatient Medications  Medication Sig Dispense Refill  . aspirin EC 81 MG tablet Take 81 mg by mouth daily.    . Atorvastatin Calcium (LIPITOR PO) Take 10 mg by mouth daily.     Marland Kitchen CALCIUM CARBONATE-VITAMIN D PO Take 1,500 mg by mouth daily.    Marland Kitchen lisinopril-hydrochlorothiazide (PRINZIDE,ZESTORETIC) 20-12.5 MG tablet Take 1 tablet by mouth daily.    . Multiple Vitamins-Minerals (CENTRUM SILVER PO) Take by mouth.     No current facility-administered medications for this visit.     Review of Systems Review of Systems  Constitutional: Negative.   Respiratory: Negative.   Cardiovascular: Negative.     Blood pressure (!) 157/81, pulse 83, temperature 98.1 F (36.7 C), resp. rate 16, height 5' 2"  (1.575 m), weight 209 lb 9.6 oz (95.1 kg), SpO2 96 %.  Physical Exam Physical Exam Exam conducted with a chaperone present.  Constitutional:      Appearance: She is well-developed.  Eyes:     General: No scleral icterus.    Conjunctiva/sclera: Conjunctivae normal.  Neck:     Musculoskeletal: Neck supple.  Cardiovascular:     Rate and Rhythm: Normal rate and  regular rhythm.     Heart sounds: Normal heart sounds.  Pulmonary:     Effort: Pulmonary effort is normal.     Breath sounds: Normal breath sounds.  Chest:     Breasts:        Right: Normal. No inverted nipple, mass, nipple discharge, skin change or tenderness.        Left: Mass present. No inverted nipple, nipple discharge, skin change or tenderness.    Lymphadenopathy:     Cervical: No cervical adenopathy.     Upper Body:     Right upper body: No supraclavicular or axillary adenopathy.     Left upper body: No supraclavicular or axillary adenopathy.  Skin:    General: Skin is warm and dry.  Neurological:     Mental Status: She is alert and oriented to person, place, and time.  Psychiatric:        Mood and Affect: Mood normal.     Data  Reviewed Mammograms of April 28, 2019 as well as those on June 9, June 12 and May 16, 2019 were independently reviewed.  BI-RADS 4.  May 05, 2019:  BREAST, LEFT, 3 O'CLOCK 3 CM FROM NIPPLE; ULTRASOUND-GUIDED CORE  BIOPSY: RIBBON - INVASIVE MAMMARY CARCINOMA, NO SPECIAL TYPE.   Size of invasive carcinoma: 6 mm in this sample  Histologic grade of invasive carcinoma: Grade 1            Glandular/tubular differentiation score: 2            Nuclear pleomorphism score: 2            Mitotic rate score: 1            Total score: 5  Ductal carcinoma in situ: Present, intermediate nuclear grade with focal  necrosis and focal calcification  Lymphovascular invasion: Not identified  BREAST BIOMARKER TESTS  Estrogen Receptor (ER) Status: POSITIVE            Percentage of cells with nuclear positivity: >90%            Average intensity of staining: Strong   Progesterone Receptor (PgR) Status: POSITIVE            Percentage of cells with nuclear positivity: >90%            Average intensity of staining: Strong   HER2 (by immunohistochemistry): NEGATIVE (score 0)   May 16, 2019 . BREAST, LEFT AT 2:00, 4 CM FROM THE NIPPLE; ULTRASOUND-GUIDED CORE  BIOPSY: COIL CLIP - INTERMEDIATE-GRADE DUCTAL CARCINOMA IN SITU, SOLID AND CRIBRIFORM,  WITH PAPILLARY FEATURES.  - NEGATIVE FOR INVASIVE CARCINOMA.   B. BREAST, LEFT AT 2:30, 6 CM FROM THE NIPPLE; ULTRASOUND-GUIDED CORE  BIOPSY: HEART SHAPE CLIP- FRAGMENTED SPECIMEN WITH DETACHED EPITHELIUM, CONSISTENT WITH  INTERMEDIATE-GRADE DUCTAL CARCINOMA IN SITU.  NEGATIVE FOR INVASIVE CARCINOMA.   June 23,2020 BREAST, LEFT UPPER OUTER QUADRANT; STEREOTACTIC BIOPSY:  - INVASIVE MAMMARY CARCINOMA, NO SPECIAL TYPE.  "X" CLIP Size of invasive carcinoma: 5 mm in this sample  Histologic grade of invasive carcinoma: Grade 1  Estrogen Receptor (ER) Status: POSITIVE             Percentage of cells with nuclear positivity: >90%            Average intensity of staining: Strong   Progesterone Receptor (PgR) Status: POSITIVE            Percentage of cells with nuclear positivity: >90%  Average intensity of staining: Strong   HER2 (by immunohistochemistry): NEGATIVE (score 0)  Assessment Multifocal areas of invasive and noninvasive cancer involving a single quadrant of the left breast.  Plan Greater than 50% of the visit was spent reviewing the options for breast cancer treatment. Breast conservation with lumpectomy and radiation therapy  was presented as equivalent to mastectomy for long-term control. The pros and cons of each treatment regimen were reviewed. The indications for additional therapy such as chemotherapy were touched on briefly, realizing that the majority of information required to determine if chemotherapy would be of benefit is not available at this time. The availability of consultation services for second opinion were offered.  Suitable website address is provided.  In spite of the multifocal nature all the disease appears confined to a single quadrant.  While there is been a radiology recommendation for MRI, in light of the 4 biopsies previously completed this would necessitate a several month period of observation to allow inflammatory changes to resolve, and more likely would push the patient to mastectomy, which at this time is not her first choice.  The area involved is approximately 5 cm in greatest dimension, but her"D" cup breast size would allow for this volume of tissue to be removed without significant deformity if she does choose breast conservation.  The role of post surgical radiation therapy was discussed.  The patient will meet with medical oncology this afternoon.  The patient was planning to go down to [place], [place] this weekend to be with her grandchildren for a week.  This  was strongly encouraged.  She will notify the office of how we can be of assistance.  She can be scheduled for surgery at any time or assist with arrangements for a second surgical opinion if needed.  HPI, Physical Exam, Assessment and Plan have been scribed under the direction and in the presence of Robert Bellow, MD  Concepcion Living, LPN  I have completed the exam and reviewed the above documentation for accuracy and completeness.  I agree with the above.  Haematologist has been used and any errors in dictation or transcription are unintentional.  Hervey Ard, M.D., F.A.C.S.  [facility] Gleason Garland Smouse 05/19/2019, 7:09 PM

## 2019-05-19 ENCOUNTER — Telehealth: Payer: Self-pay | Admitting: *Deleted

## 2019-05-19 ENCOUNTER — Encounter: Payer: Self-pay | Admitting: General Surgery

## 2019-05-19 DIAGNOSIS — C50412 Malignant neoplasm of upper-outer quadrant of left female breast: Secondary | ICD-10-CM

## 2019-05-19 DIAGNOSIS — Z170 Estrogen receptor positive status [ER+]: Secondary | ICD-10-CM

## 2019-05-19 HISTORY — DX: Malignant neoplasm of upper-outer quadrant of left female breast: Z17.0

## 2019-05-19 HISTORY — DX: Malignant neoplasm of upper-outer quadrant of left female breast: C50.412

## 2019-05-19 NOTE — Telephone Encounter (Signed)
Patient called and is ready to schedule surgery

## 2019-05-21 ENCOUNTER — Encounter: Payer: Self-pay | Admitting: Oncology

## 2019-05-21 DIAGNOSIS — Z7189 Other specified counseling: Secondary | ICD-10-CM | POA: Insufficient documentation

## 2019-05-21 NOTE — Progress Notes (Signed)
Hematology/Oncology Consult note [facility] Telephone:([phone] [phone]  Patient Care Team: Ezequiel Kayser, MD as PCP - General (Internal Medicine)   Name of the patient: Amanda Wade  [id number]  1951-08-01    Reason for referral-new diagnosis of breast cancer   Referring physician-Dr. Vikki Ports Ward  Date of visit: 05/21/19   History of presenting illness- Patient is a 68 year old postmenopausal female with a past medical history significant for hypertension hyperlipidemia among other medical problems.  She recently underwent a screening mammogram on 04/28/2019 which showed possible mass in the left breast.  This was followed by diagnostic mammogram and ultrasound which showed bilobar hypo-Echoic mass at the 2:30 position of the left breast measuring 0.5 x 0.6 x 1.1 cm.  Hypoechoic mass at the 2 o'clock position as well.  And another bilobed irregular hypoechoic mass at the 3 o'clock position measuring 4 x 5 x 6 mm.  Patient had biopsy of all these 3 masses.  The 3 o'clock position breast mass came back as invasive mammary carcinoma 6 mm, grade 1 strongly ER PR positive greater than 90% and HER-2/neu negative.  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  230 breast mass also came back as intermediate grade DCIS.  There was also another mass noted in the left breast which was biopsied and was 5 mm grade 1 ER PR positive and HER-2/neu negative.  Overall patient is feeling well.  Her appetite is good and she denies unintentional weight loss.  Denies any aches or pains anywhere.  Family history significant for breast cancer in her maternal cousin.  Her father had prostate cancer and possibly colon polyps/colon cancer.  ECOG PS- 1  Pain scale- 0   Review of systems- Review of Systems  Constitutional: Negative for chills, fever, malaise/fatigue and weight loss.  HENT: Negative for congestion, ear discharge and nosebleeds.   Eyes: Negative for  blurred vision.  Respiratory: Negative for cough, hemoptysis, sputum production, shortness of breath and wheezing.   Cardiovascular: Negative for chest pain, palpitations, orthopnea and claudication.  Gastrointestinal: Negative for abdominal pain, blood in stool, constipation, diarrhea, heartburn, melena, nausea and vomiting.  Genitourinary: Negative for dysuria, flank pain, frequency, hematuria and urgency.  Musculoskeletal: Negative for back pain, joint pain and myalgias.  Skin: Negative for rash.  Neurological: Negative for dizziness, tingling, focal weakness, seizures, weakness and headaches.  Endo/Heme/Allergies: Does not bruise/bleed easily.  Psychiatric/Behavioral: Negative for depression and suicidal ideas. The patient does not have insomnia.     Allergies  Allergen Reactions   Shellfish Allergy Rash   Sulfa Antibiotics Rash    Patient Active Problem List   Diagnosis Date Noted   Malignant neoplasm of upper-outer quadrant of left breast in female, estrogen receptor positive ([place]) 05/19/2019     Past Medical History:  Diagnosis Date   Hyperlipidemia    Hypertension    Malignant neoplasm of upper-outer quadrant of left breast in female, estrogen receptor positive ([place]) 05/19/2019     Past Surgical History:  Procedure Laterality Date   ABDOMINAL HYSTERECTOMY  2004   complete   BREAST BIOPSY Left 05/05/2019   [place] bx path pending ribbon clip   BREAST BIOPSY Left 05/16/2019   [place] path pending    BREAST BIOPSY Left 05/16/2019   Affirm Biopsy- X clip   BREAST CYST ASPIRATION Bilateral age 18   neg   CHOLECYSTECTOMY     COLONOSCOPY WITH PROPOFOL N/A 08/02/2018   Procedure: COLONOSCOPY WITH PROPOFOL;  Surgeon:  [name], MD;  Location: [facility] ENDOSCOPY;  Service: Endoscopy;  Laterality: N/A;   DILATION AND CURETTAGE OF UTERUS     goiter surgery     OOPHORECTOMY     THYROIDECTOMY, PARTIAL      Social History   Socioeconomic History   Marital  status: Divorced    Spouse name: Not on file   Number of children: Not on file   Years of education: Not on file   Highest education level: Not on file  Occupational History   Not on file  Social Needs   Financial resource strain: Not on file   Food insecurity    Worry: Not on file    Inability: Not on file   Transportation needs    Medical: Not on file    Non-medical: Not on file  Tobacco Use   Smoking status: Never Smoker   Smokeless tobacco: Never Used  Substance and Sexual Activity   Alcohol use: No   Drug use: No   Sexual activity: Not on file  Lifestyle   Physical activity    Days per week: Not on file    Minutes per session: Not on file   Stress: Not on file  Relationships   Social connections    Talks on phone: Not on file    Gets together: Not on file    Attends religious service: Not on file    Active member of club or organization: Not on file    Attends meetings of clubs or organizations: Not on file    Relationship status: Not on file   Intimate partner violence    Fear of current or ex partner: Not on file    Emotionally abused: Not on file    Physically abused: Not on file    Forced sexual activity: Not on file  Other Topics Concern   Not on file  Social History Narrative   Not on file     Family History  Problem Relation Age of Onset   Breast cancer Cousin        mat cousin   Hypertension Mother    Heart attack Father    Prostate cancer Father    Diabetes Father    Colon polyps Father      Current Outpatient Medications:    aspirin EC 81 MG tablet, Take 81 mg by mouth daily., Disp: , Rfl:    Atorvastatin Calcium (LIPITOR PO), Take 10 mg by mouth daily. , Disp: , Rfl:    CALCIUM CARBONATE-VITAMIN D PO, Take 1,500 mg by mouth daily., Disp: , Rfl:    lisinopril-hydrochlorothiazide (PRINZIDE,ZESTORETIC) 20-12.5 MG tablet, Take 1 tablet by mouth daily., Disp: , Rfl:    Multiple Vitamins-Minerals (CENTRUM SILVER  PO), Take by mouth., Disp: , Rfl:    Physical exam:  Vitals:   05/18/19 1316  BP: 123/88  Pulse: 87  Resp: 20  Temp: 98.8 F (37.1 C)  TempSrc: Tympanic  Weight: 209 lb (94.8 kg)  Height: 5' 2" (1.575 m)   Physical Exam Constitutional:      General: She is not in acute distress. HENT:     Head: Normocephalic and atraumatic.  Eyes:     Pupils: Pupils are equal, round, and reactive to light.  Neck:     Musculoskeletal: Normal range of motion.  Cardiovascular:     Rate and Rhythm: Normal rate and regular rhythm.     Heart sounds: Normal heart sounds.  Pulmonary:     Effort: Pulmonary  effort is normal.     Breath sounds: Normal breath sounds.  Abdominal:     General: Bowel sounds are normal.     Palpations: Abdomen is soft.  Skin:    General: Skin is warm and dry.  Neurological:     Mental Status: She is alert and oriented to person, place, and time.   Breast exam performed in sitting and laying down position.  No palpable masses in the right breast.  No palpable bilateral axillary adenopathy.  There is induration noted at the site of breast biopsy. CMP Latest Ref Rng & Units 07/31/2017  Glucose 65 - 99 mg/dL 138(H)  BUN 6 - 20 mg/dL 17  Creatinine 0.44 - 1.00 mg/dL 0.96  Sodium 135 - 145 mmol/L 136  Potassium 3.5 - 5.1 mmol/L 3.7  Chloride 101 - 111 mmol/L 101  CO2 22 - 32 mmol/L 26  Calcium 8.9 - 10.3 mg/dL 9.7   CBC Latest Ref Rng & Units 07/31/2017  WBC 3.6 - 11.0 K/uL 12.2(H)  Hemoglobin 12.0 - 16.0 g/dL 13.1  Hematocrit 35.0 - 47.0 % 36.1  Platelets 150 - 440 K/uL 308    No images are attached to the encounter.  US Breast Ltd Uni Left Inc Axilla  Result Date: 05/02/2019 CLINICAL DATA:  Recall from screening to evaluate 2 possible masses. EXAM: DIGITAL DIAGNOSTIC left MAMMOGRAM WITH TOMO ULTRASOUND left BREAST COMPARISON:  Previous exam(s). ACR Breast Density Category b: There are scattered areas of fibroglandular density. FINDINGS: Additional spot compression  images were obtained. There are several small subcentimeter round/oval nodular densities waxing and waning over the outer mid to upper left breast compared to prior exams. Today's images demonstrate an oval partially circumscribed but partially irregular 9 mm nodule over the outer midportion of the left breast in the middle third. There is also a 4 mm oval partially circumscribed mass over the upper outer left breast in the middle third. Remainder the exam is unchanged. Targeted ultrasound is performed, showing a bilobed hypoechoic mass with minimal through transmission at the 2:30 position of the left breast 6 cm from the nipple measuring 0.5 x 0.6 x 1.1 cm which may correspond to the larger mammographic finding described. There is an oval circumscribed hypoechoic mass with mild through transmission at the 2 o'clock position 4 cm from the nipple which mammary not correspond to the second mammographic abnormality. There is also a somewhat bilobed slightly irregular hypoechoic mass with shadowing at the 3 o'clock position of the left breast 3 cm from the nipple measuring 4 x 5 x 6 mm. This may or may not correspond to 1 of the mammographic findings. Ultrasound of the left axilla is normal. IMPRESSION: Two probable benign masses at the 2 o'clock and 2:30 position of the left breast as described. More indeterminate appearing 6 mm mass at the 3 o'clock position of the left breast 3 cm from the nipple. RECOMMENDATION: Recommend ultrasound-guided core needle biopsy of the indeterminate mass at the 3 o'clock position. If benign, would recommend six-month follow-up diagnostic left breast mammogram and ultrasound of the other 2 probable benign masses. If malignant, patient may require biopsies of the other masses. I have discussed the findings and recommendations with the patient. Results were also provided in writing at the conclusion of the visit. If applicable, a reminder letter will be sent to the patient regarding the  next appointment. BI-RADS CATEGORY  4: Suspicious. Biopsy will be scheduled prior to patient's departure from the [facility]. Electronically Signed  By: Marin Olp M.D.   On: 05/02/2019 14:58   Mm Diag Breast Tomo Uni Left  Result Date: 05/02/2019 CLINICAL DATA:  Recall from screening to evaluate 2 possible masses. EXAM: DIGITAL DIAGNOSTIC left MAMMOGRAM WITH TOMO ULTRASOUND left BREAST COMPARISON:  Previous exam(s). ACR Breast Density Category b: There are scattered areas of fibroglandular density. FINDINGS: Additional spot compression images were obtained. There are several small subcentimeter round/oval nodular densities waxing and waning over the outer mid to upper left breast compared to prior exams. Today's images demonstrate an oval partially circumscribed but partially irregular 9 mm nodule over the outer midportion of the left breast in the middle third. There is also a 4 mm oval partially circumscribed mass over the upper outer left breast in the middle third. Remainder the exam is unchanged. Targeted ultrasound is performed, showing a bilobed hypoechoic mass with minimal through transmission at the 2:30 position of the left breast 6 cm from the nipple measuring 0.5 x 0.6 x 1.1 cm which may correspond to the larger mammographic finding described. There is an oval circumscribed hypoechoic mass with mild through transmission at the 2 o'clock position 4 cm from the nipple which mammary not correspond to the second mammographic abnormality. There is also a somewhat bilobed slightly irregular hypoechoic mass with shadowing at the 3 o'clock position of the left breast 3 cm from the nipple measuring 4 x 5 x 6 mm. This may or may not correspond to 1 of the mammographic findings. Ultrasound of the left axilla is normal. IMPRESSION: Two probable benign masses at the 2 o'clock and 2:30 position of the left breast as described. More indeterminate appearing 6 mm mass at the 3 o'clock position of the  left breast 3 cm from the nipple. RECOMMENDATION: Recommend ultrasound-guided core needle biopsy of the indeterminate mass at the 3 o'clock position. If benign, would recommend six-month follow-up diagnostic left breast mammogram and ultrasound of the other 2 probable benign masses. If malignant, patient may require biopsies of the other masses. I have discussed the findings and recommendations with the patient. Results were also provided in writing at the conclusion of the visit. If applicable, a reminder letter will be sent to the patient regarding the next appointment. BI-RADS CATEGORY  4: Suspicious. Biopsy will be scheduled prior to patient's departure from the [facility]. Electronically Signed   By: Marin Olp M.D.   On: 05/02/2019 14:58   Mm 3d Screen Breast Bilateral  Result Date: 04/28/2019 CLINICAL DATA:  Screening. EXAM: DIGITAL SCREENING BILATERAL MAMMOGRAM WITH TOMO AND CAD COMPARISON:  Previous exam(s). ACR Breast Density Category b: There are scattered areas of fibroglandular density. FINDINGS: In the left breast, possible masses warrant further evaluation. In the right breast, no findings suspicious for malignancy. Images were processed with CAD. IMPRESSION: Further evaluation is suggested for possible masses in the left breast. RECOMMENDATION: Diagnostic mammogram and possibly ultrasound of the left breast. (Code:[id number]-L-67M) The patient will be contacted regarding the findings, and additional imaging will be scheduled. BI-RADS CATEGORY  0: Incomplete. Need additional imaging evaluation and/or prior mammograms for comparison. Electronically Signed   By: Lovey Newcomer M.D.   On: 04/28/2019 09:14   Mm Clip Placement Left  Result Date: 05/16/2019 CLINICAL DATA:  Post stereotactic guided biopsy of the small mass in the upper outer left breast best seen on mammography. EXAM: DIAGNOSTIC LEFT MAMMOGRAM POST STEREOTACTIC BIOPSY COMPARISON:  Previous exam(s). FINDINGS: Mammographic images  were obtained following stereotactic guided biopsy of a mass in  the upper-outer left breast. An X shaped biopsy marking clip is present at the site of the biopsied mass in the upper-outer left breast. A small hematoma is present obscuring visualization of the mass. IMPRESSION: X shaped biopsy marking clip at site of biopsied mass in the upper-outer left breast, although small hematoma obscures visualization of the mass. Final Assessment: Post Procedure Mammograms for Marker Placement Electronically Signed   By: Everlean Alstrom M.D.   On: 05/16/2019 15:18   Mm Clip Placement Left  Result Date: 05/16/2019 CLINICAL DATA:  Post ultrasound-guided biopsy of a mass in the left breast at the 2 o'clock position 6 cm from nipple and ultrasound-guided biopsy of a mass in the left breast at the 2:30 position 4 cm from nipple. EXAM: DIAGNOSTIC LEFT MAMMOGRAM POST ULTRASOUND BIOPSY COMPARISON:  Previous exam(s). FINDINGS: Mammographic images were obtained following ultrasound-guided biopsy of a mass in the left breast at the 2 o'clock position 6 cm from nipple and ultrasound-guided biopsy of a mass in the left breast at the 2:30 position 4 cm from nipple. A coil shaped biopsy marking clip is present at the biopsied mass in the left breast at the 2 o'clock position. A heart shaped biopsy marking clip is present at the site of the biopsied mass in the left breast at the 2:30 position. The post biopsy images demonstrate a small irregular mass with possible slightly spiculated margins in the upper-outer left breast located posterior to the previously biopsied areas. This is located approximately 2.5 cm lateral and slightly posterior to the biopsy proven malignancy. IMPRESSION: 1. Coil shaped biopsy marking clip at site of biopsied mass in the left breast at the 2 o'clock position. 2. Heart shaped biopsy marking clip at site of biopsied mass in the left breast at 2:30 position. 3. Additional suspicious irregular/possible  spiculated mass in the upper-outer left breast for which stereotactic biopsy is recommended. This will be subsequently performed and dictated separately. Final Assessment: Post Procedure Mammograms for Marker Placement Electronically Signed   By: Everlean Alstrom M.D.   On: 05/16/2019 15:11   Mm Clip Placement Left  Result Date: 05/05/2019 CLINICAL DATA:  Status post ultrasound-guided biopsy left breast mass 3 o'clock position. EXAM: DIAGNOSTIC LEFT MAMMOGRAM POST ULTRASOUND BIOPSY COMPARISON:  Previous exam(s). FINDINGS: Mammographic images were obtained following ultrasound guided biopsy of left breast mass 3 o'clock position. Ribbon shaped marking clip in appropriate position. IMPRESSION: Appropriate position ribbon shaped marking clip status post ultrasound-guided biopsy left breast mass 3 o'clock position. Final Assessment: Post Procedure Mammograms for Marker Placement Electronically Signed   By: Lovey Newcomer M.D.   On: 05/05/2019 09:31   Mm Lt Breast Bx W Loc Dev 1st Lesion Image Bx Spec Stereo Guide  Result Date: 05/16/2019 CLINICAL DATA:  68 year old female with a suspicious mass best seen on mammography in the upper outer slightly posterior left breast. EXAM: LEFT BREAST STEREOTACTIC CORE NEEDLE BIOPSY COMPARISON:  Previous exams. FINDINGS: The patient and I discussed the procedure of stereotactic-guided biopsy including benefits and alternatives. We discussed the high likelihood of a successful procedure. We discussed the risks of the procedure including infection, bleeding, tissue injury, clip migration, and inadequate sampling. Informed written consent was given. The usual time out protocol was performed immediately prior to the procedure. Using sterile technique and 1% Lidocaine as local anesthetic, under stereotactic guidance, a 9 gauge vacuum assisted device was used to perform core needle biopsy of the small irregular mass in the upper-outer left breast using a superior to  inferior approach.  Lesion quadrant: Upper-outer At the conclusion of the procedure, an X shaped tissue marker clip was deployed into the biopsy cavity. Follow-up 2-view mammogram was performed and dictated separately. IMPRESSION: Stereotactic-guided biopsy of the small mass in the upper-outer left breast, at site of X shaped biopsy marking clip. No apparent complications. Electronically Signed   By: Everlean Alstrom M.D.   On: 05/16/2019 15:16   [place] Lt Breast Bx W Loc Dev 1st Lesion Img Bx Spec US Guide  Result Date: 05/16/2019 CLINICAL DATA:  68 year old female with recently diagnosed left breast invasive mammary carcinoma post ultrasound-guided biopsy of a small mass at the 3 o'clock position. The patient also had 2 additional probably benign masses in the upper-outer left breast for which biopsy is recommended. EXAM: ULTRASOUND GUIDED LEFT BREAST CORE NEEDLE BIOPSY COMPARISON:  Previous exam(s). FINDINGS: I met with the patient and we discussed the procedure of ultrasound-guided biopsy, including benefits and alternatives. We discussed the high likelihood of a successful procedure. We discussed the risks of the procedure, including infection, bleeding, tissue injury, clip migration, and inadequate sampling. Informed written consent was given. The usual time-out protocol was performed immediately prior to the procedure. SITE 1: LEFT BREAST 2 O'CLOCK 4 CM FROM NIPPLE: MASS: Lesion quadrant: Upper-outer Using sterile technique and 1% Lidocaine as local anesthetic, under direct ultrasound visualization, a 14 gauge spring-loaded device was used to perform biopsy of the small mass in the left breast at the 2 o'clock position using a lateral to medial approach. At the conclusion of the procedure a coil shaped tissue marker clip was deployed into the biopsy cavity. Follow up 2 view mammogram was performed and dictated separately. SITE 2: LEFT BREAST 2:30 POSITION 6 CM FROM NIPPLE: MASS: Lesion quadrant: Upper-outer Using sterile  technique and 1% Lidocaine as local anesthetic, under direct ultrasound visualization, a 14 gauge spring-loaded device was used to perform biopsy of the mass in the left breast at the 2:30 position using a lateral to medial approach. At the conclusion of the procedure a heart shaped tissue marker clip was deployed into the biopsy cavity. Follow up 2 view mammogram was performed and dictated separately. IMPRESSION: 1. Ultrasound-guided biopsy of the mass in the left breast at the 2 o'clock position, at site of coil shaped biopsy marking clip. 2. Ultrasound-guided biopsy of the mass in the left breast at the 2:30 position, at site of heart shaped biopsy marking clip. Electronically Signed   By: Everlean Alstrom M.D.   On: 05/16/2019 14:05   [place] Lt Breast Bx W Loc Dev 1st Lesion Img Bx Spec US Guide  Addendum Date: 05/08/2019   ADDENDUM REPORT: 05/08/2019 14:13 ADDENDUM: PATHOLOGY: Of the LEFT breast biopsy revealed BREAST, LEFT, 3 O'CLOCK 3 CM FROM NIPPLE; ULTRASOUND-GUIDED CORE BIOPSY: INVASIVE MAMMARY CARCINOMA, NO SPECIAL TYPE. Size of invasive carcinoma: 6 mm in this sample. Histologic grade of invasive carcinoma: Grade 1. Ductal carcinoma in situ: Present, intermediate nuclear grade with focal necrosis and focal calcification. Lymphovascular invasion: Not identified. CONCORDANT: This was found to be concordant by Dr. Lovey Newcomer. I telephoned the patient on 60/15/2020 at 1:45 PM and discussed these results and the recommendations stated below. All questions were answered. The patient denies significant pain or bleeding from the biopsy site. Biopsy site care instructions were reviewed and the patient was asked to call the [facility] with any questions or issues related to the biopsy. RECOMMENDATION: Biopsy of 2 additional masses in the left breast prior to referrals.  Surgical and oncology referrals following additional biopsies. Addendum by Jetta Lout, RRA on 05/08/2019 Electronically Signed   By:  Lovey Newcomer M.D.   On: 05/08/2019 14:13   Result Date: 05/08/2019 CLINICAL DATA:  Patient with indeterminate left breast mass. EXAM: ULTRASOUND GUIDED LEFT BREAST CORE NEEDLE BIOPSY COMPARISON:  Previous exam(s). FINDINGS: I met with the patient and we discussed the procedure of ultrasound-guided biopsy, including benefits and alternatives. We discussed the high likelihood of a successful procedure. We discussed the risks of the procedure, including infection, bleeding, tissue injury, clip migration, and inadequate sampling. Informed written consent was given. The usual time-out protocol was performed immediately prior to the procedure. Lesion quadrant: Lower outer quadrant Using sterile technique and 1% Lidocaine as local anesthetic, under direct ultrasound visualization, a 14 gauge spring-loaded device was used to perform biopsy of left breast mass 3 o'clock position using a lateral approach. At the conclusion of the procedure a ribbon shaped tissue marker clip was deployed into the biopsy cavity. Follow up 2 view mammogram was performed and dictated separately. IMPRESSION: Ultrasound guided biopsy of left breast mass 3 o'clock position. No apparent complications. Electronically Signed: By: Lovey Newcomer M.D. On: 05/05/2019 09:30   [place] Lt Breast Bx W Loc Dev Ea Add Lesion Img Bx Spec US Guide  Result Date: 05/16/2019 CLINICAL DATA:  68 year old female with recently diagnosed left breast invasive mammary carcinoma post ultrasound-guided biopsy of a small mass at the 3 o'clock position. The patient also had 2 additional probably benign masses in the upper-outer left breast for which biopsy is recommended. EXAM: ULTRASOUND GUIDED LEFT BREAST CORE NEEDLE BIOPSY COMPARISON:  Previous exam(s). FINDINGS: I met with the patient and we discussed the procedure of ultrasound-guided biopsy, including benefits and alternatives. We discussed the high likelihood of a successful procedure. We discussed the risks of the  procedure, including infection, bleeding, tissue injury, clip migration, and inadequate sampling. Informed written consent was given. The usual time-out protocol was performed immediately prior to the procedure. SITE 1: LEFT BREAST 2 O'CLOCK 4 CM FROM NIPPLE: MASS: Lesion quadrant: Upper-outer Using sterile technique and 1% Lidocaine as local anesthetic, under direct ultrasound visualization, a 14 gauge spring-loaded device was used to perform biopsy of the small mass in the left breast at the 2 o'clock position using a lateral to medial approach. At the conclusion of the procedure a coil shaped tissue marker clip was deployed into the biopsy cavity. Follow up 2 view mammogram was performed and dictated separately. SITE 2: LEFT BREAST 2:30 POSITION 6 CM FROM NIPPLE: MASS: Lesion quadrant: Upper-outer Using sterile technique and 1% Lidocaine as local anesthetic, under direct ultrasound visualization, a 14 gauge spring-loaded device was used to perform biopsy of the mass in the left breast at the 2:30 position using a lateral to medial approach. At the conclusion of the procedure a heart shaped tissue marker clip was deployed into the biopsy cavity. Follow up 2 view mammogram was performed and dictated separately. IMPRESSION: 1. Ultrasound-guided biopsy of the mass in the left breast at the 2 o'clock position, at site of coil shaped biopsy marking clip. 2. Ultrasound-guided biopsy of the mass in the left breast at the 2:30 position, at site of heart shaped biopsy marking clip. Electronically Signed   By: Everlean Alstrom M.D.   On: 05/16/2019 14:05    Assessment and plan- Patient is a 68 y.o. 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  Patient found to have 4 distinct breast masses all within the same quadrant of the left breast 2 of which  were invasive mammary carcinoma grade 1 ER PR positive and HER-2/neu negative.  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  Mammogram and ultrasound did not show any evidence of pathologic left axillary adenopathy.  We discussed patient's case at the tumor board.  Since all the 4 breast masses are within the same quadrant plan is to proceed with upfront lumpectomy and I will wait for the results of final pathology to decide on which specimen to send for Oncotype testing.  We will hold off on getting a breast MRI since there is a considerable delay in getting this done.  Discussed what Oncotype testing is and how the results are interpreted.  If patient has a score of 25 or less she will fall in the lower intermediate risk group and would not benefit from adjuvant chemotherapy.  Adjuvant chemotherapy would be indicated if her score is 26 or higher.  Given that her tumor is ER PR positive there would also be a role for hormone therapy for 5 years which I will discuss in greater detail after surgery.  I will also refer the patient to radiation oncology when I see the patient back after surgery to discuss the results of her Oncotype testing.  Treatment will be given with a curative intent   Total face to face encounter time for this patient visit was  min. >50% of the time was  spent in counseling and coordination of care.    Cancer Staging Malignant neoplasm of upper-outer quadrant of left breast in female, estrogen receptor positive ([place]) Staging form: Breast, AJCC 8th Edition - Clinical stage from 05/18/2019: Stage IA (cT1c, cN0, cM0, G1, ER+, PR+, HER2-) - Signed by Sindy Guadeloupe, MD on 05/21/2019     Thank you for this kind referral and the opportunity to participate in the care of this patient   Visit Diagnosis 1. Malignant neoplasm of upper-outer quadrant of left breast in female, estrogen receptor positive ([place])   2. Goals of care, counseling/discussion     Dr. Randa Evens, MD, MPH [facility] at  [facility] [phone] 05/21/2019 11:31 AM

## 2019-05-22 NOTE — Telephone Encounter (Signed)
Patient has decided to have a Lumpectomy done. She would like to have surgery on 06/05/19. I will let her know if Dr Bary Castilla needs to see her in office prior and will call with surgery instructions and pre admit date and time.

## 2019-05-23 ENCOUNTER — Other Ambulatory Visit: Payer: Self-pay

## 2019-05-23 DIAGNOSIS — C50412 Malignant neoplasm of upper-outer quadrant of left female breast: Secondary | ICD-10-CM

## 2019-05-23 MED ORDER — LIDOCAINE-PRILOCAINE 2.5-2.5 % EX CREA: TOPICAL_CREAM | CUTANEOUS | 0 refills | Status: DC

## 2019-05-24 HISTORY — PX: BREAST LUMPECTOMY: SHX2

## 2019-05-25 ENCOUNTER — Other Ambulatory Visit: Payer: Self-pay | Admitting: General Surgery

## 2019-05-25 ENCOUNTER — Telehealth: Payer: Self-pay

## 2019-05-25 ENCOUNTER — Telehealth: Payer: Self-pay | Admitting: General Surgery

## 2019-05-25 DIAGNOSIS — Z170 Estrogen receptor positive status [ER+]: Secondary | ICD-10-CM

## 2019-05-25 DIAGNOSIS — C50412 Malignant neoplasm of upper-outer quadrant of left female breast: Secondary | ICD-10-CM

## 2019-05-25 NOTE — Telephone Encounter (Signed)
Patient had questions about advisability of MRI. Discussed inflammatory changes would need to resolve before the study could give [place] the best information, otherwise,high likelyhood of overestimating disease. Could safely delay 3 months on anti estrogen therapy, then obtain MRI.  All areas of concern confined to a small area of a large breast. Possibility of positive margin with need for re-excision discussed.   Still leaning toward breast conservation which is fine.    She has a preop appointment scheduled, and we will review options at that time.

## 2019-05-25 NOTE — Telephone Encounter (Signed)
Patient has called and would like Dr Terri Piedra to call to discuss some questions in reference to her procedure (Lumpectomy). She is out of town at this time and can be reached on her cell phone at [phone].   Patient has not been called with surgery information at this time, however in the previous note, patient called and would like to have the lumpectomy done on 7/13.

## 2019-05-25 NOTE — Telephone Encounter (Signed)
The patient is scheduled for surgery at [facility] with Dr Bary Castilla on 06/05/19.

## 2019-05-25 NOTE — Telephone Encounter (Signed)
Spoke with patient, she will pre admit at [facility] on 06/01/19 at 10:00 am. She is aware to enter through the [place] entrance. She will also have Covid-19 testing done that day.  She will arrive the morning of her surgery at 7:45 am and enter in through the [place] entrance.   She will be seen her in office by Dr Bary Castilla for a pre op appointment on 05/30/19 at 9:00 am.

## 2019-05-30 ENCOUNTER — Encounter: Payer: Self-pay | Admitting: General Surgery

## 2019-05-30 ENCOUNTER — Ambulatory Visit (INDEPENDENT_AMBULATORY_CARE_PROVIDER_SITE_OTHER): Payer: Medicare Other | Admitting: General Surgery

## 2019-05-30 ENCOUNTER — Other Ambulatory Visit: Payer: Self-pay

## 2019-05-30 VITALS — BP 132/64 | HR 71 | Temp 97.9°F | Ht 61.0 in | Wt 209.0 lb

## 2019-05-30 DIAGNOSIS — Z170 Estrogen receptor positive status [ER+]: Secondary | ICD-10-CM

## 2019-05-30 DIAGNOSIS — C50412 Malignant neoplasm of upper-outer quadrant of left female breast: Secondary | ICD-10-CM

## 2019-05-30 NOTE — Progress Notes (Addendum)
Patient ID: Amanda Wade, female   DOB: [birth year], 68 y.o.   MRN: [id number]  Chief Complaint  Patient presents with  . Pre-op Exam    pre op for scheduled surgery on 06/05/19    HPI Amanda Wade is a 68 y.o. female.  Here for preop left breast wide excision scheduled for 06-05-19. The patient enjoyed her visit to [place] where she spent time with her 26-monthold grandson, Amanda Wade  We had spoken by phone while she was on vacation to review the results of her last biopsy, another foci of DCIS and review options for management. HPI  Past Medical History:  Diagnosis Date  . Hyperlipidemia   . Hypertension   . Malignant neoplasm of upper-outer quadrant of left breast in [place], [place]    Past Surgical History:  Procedure Laterality Date  . ABDOMINAL HYSTERECTOMY  2004   complete  . BREAST BIOPSY Left 05/05/2019   u[place]abx path pending ribbon clip  . BREAST BIOPSY Left 05/16/2019   u[place]apath pending   . BREAST BIOPSY Left 05/16/2019   Affirm Biopsy- X clip  . BREAST CYST ASPIRATION Bilateral age 68  neg  . CHOLECYSTECTOMY    . COLONOSCOPY WITH PROPOFOL N/A 08/02/2018   Procedure: COLONOSCOPY WITH PROPOFOL;  Surgeon: SLollie Sails MD;  [facility];  Service: Endoscopy;  Laterality: N/A;  . DILATION AND CURETTAGE OF UTERUS    . goiter surgery    . OOPHORECTOMY    . THYROIDECTOMY, PARTIAL      Family History  Problem Relation Age of Onset  . Breast cancer Cousin        mat cousin  . Hypertension Mother   . Heart attack Father   . Prostate cancer Father   . Diabetes Father   . Colon polyps Father     Social History Social History   Tobacco Use  . Smoking status: Never Smoker  . Smokeless tobacco: Never Used  Substance Use Topics  . Alcohol use: No  . Drug use: No    Allergies  Allergen Reactions  . Shellfish Allergy Rash  . Sulfa Antibiotics Rash    Current Outpatient Medications  Medication Sig  Dispense Refill  . aspirin EC 81 MG tablet Take 81 mg by mouth daily.    .Marland Kitchenatorvastatin (LIPITOR) 10 MG tablet Take 10 mg by mouth daily.     . Calcium Carbonate-Vitamin D 600-400 MG-UNIT tablet Take 1 tablet by mouth daily.     .Marland Kitchenlidocaine-prilocaine (EMLA) cream Apply to the areola and cover with plastic wrap one hour prior to leaving for surgery. 5 g 0  . lisinopril-hydrochlorothiazide (PRINZIDE,ZESTORETIC) 20-12.5 MG tablet Take 1 tablet by mouth daily.    . Multiple Vitamins-Minerals (CENTRUM SILVER PO) Take 1 tablet by mouth daily.      No current facility-administered medications for this visit.     Review of Systems Review of Systems  Constitutional: Negative.   Respiratory: Negative.   Cardiovascular: Negative.     Blood pressure 132/64, pulse 71, temperature 97.9 F (36.6 C), temperature source Temporal, height _0  (1.549 m), weight 209 lb (94.8 kg), SpO2 96 %.  Physical Exam Physical Exam Constitutional:      Appearance: She is well-developed.  Eyes:     General: No scleral icterus.    Conjunctiva/sclera: Conjunctivae normal.  Neck:     Musculoskeletal: Neck supple.  Cardiovascular:     Rate and Rhythm: Normal rate and regular  rhythm.     Heart sounds: Normal heart sounds.  Pulmonary:     Effort: Pulmonary effort is normal.     Breath sounds: Normal breath sounds.  Chest:     Breasts:        Right: Normal.     Lymphadenopathy:     Cervical: No cervical adenopathy.     Upper Body:     Right upper body: No axillary adenopathy.     Left upper body: No axillary adenopathy.  Skin:    General: Skin is warm and dry.  Neurological:     Mental Status: She is alert and oriented to person, place, and time.     Data Reviewed Pathology and all previous biopsies reviewed.  Invasive mammary carcinoma, 5 mm, ER/PR positive.  HER-2 negative.("X" clip)The patient left the office before the visit was finished. Upper outer quadrant. Biopsies of the 2 and 230 o'clock  position showed intermediate grade DCIS." Coil and Heart clips" Biopsies the 3 o'clock position: Invasive mammary carcinoma, 6 mm, ER/PR positive, HER-2/neu not overexpressed.  ("ribbon" clip.) Post biopsy imaging reviewed.  Assessment 2 foci of invasive mammary carcinoma within the same quadrant of the breast as well as areas of atypical ductal hyperplasia.  Plan Options for management were reviewed in detail: 1) plan for bracketing wires and excision of all 4 areas recently biopsied with the goal of achieving negative margins to be followed by postoperative radiation therapy with potential for positive margins requiring reexcision versus 2) proceeding directly to mastectomy.  The radiology department had suggested consideration of MRI to better establish of additional foci of cancer were present in the left breast.  After undergoing 4 biopsies this would necessitate at minimum a 59-monthwait for inflammatory changes to resolve, otherwise the natural tendency of MRI to ovaries estimate extent of disease might be magnified and precluded consideration of breast conservation.  This option could be undertaken with the patient on antiestrogen therapy to minimize progression of disease but was not felt to be an acceptable option on review with the patient.  At this time, the patient is comfortable with proceeding with attempt at breast conservation. Indications for sentinel node biopsy reviewed.  She will make use of EMLA cream the morning of the procedure prior to technetium injection.  Patient is scheduled for left breast wide excision on 06/05/2019  HPI, Physical Exam, Assessment and Plan have been scribed under the direction and in the presence of JHervey Ard MD.  JGaspar Cola CMA   HPI, assessment, plan and physical exam has been scribed under the direction and in the presence of JRobert Bellow MD. MKarie Fetch RN  I have completed the exam and reviewed the above documentation  for accuracy and completeness.  I agree with the above.  DHaematologisthas been used and any errors in dictation or transcription are unintentional.  JHervey Ard M.D., F.A.C.S.  JForest GleasonByrnett 05/30/2019, 8:58 PM

## 2019-05-30 NOTE — Patient Instructions (Signed)
Patient is scheduled for left breast wide excision on 06/05/2019

## 2019-06-01 ENCOUNTER — Other Ambulatory Visit: Payer: Self-pay

## 2019-06-01 ENCOUNTER — Encounter
Admission: RE | Admit: 2019-06-01 | Discharge: 2019-06-01 | Disposition: A | Discharge status: Home / Self Care | Payer: Medicare Other | Source: Ambulatory Visit | Attending: General Surgery | Admitting: General Surgery

## 2019-06-01 DIAGNOSIS — R9431 Abnormal electrocardiogram [ECG] [EKG]: Secondary | ICD-10-CM | POA: Diagnosis not present

## 2019-06-01 DIAGNOSIS — Z01818 Encounter for other preprocedural examination: Secondary | ICD-10-CM | POA: Insufficient documentation

## 2019-06-01 DIAGNOSIS — C50912 Malignant neoplasm of unspecified site of left female breast: Secondary | ICD-10-CM | POA: Diagnosis not present

## 2019-06-01 DIAGNOSIS — I10 Essential (primary) hypertension: Secondary | ICD-10-CM | POA: Diagnosis not present

## 2019-06-01 DIAGNOSIS — Z1159 Encounter for screening for other viral diseases: Secondary | ICD-10-CM | POA: Diagnosis not present

## 2019-06-01 HISTORY — DX: Other complications of anesthesia, initial encounter: T88.59XA

## 2019-06-01 HISTORY — DX: Dizziness and giddiness: R42

## 2019-06-01 LAB — CBC
HCT: 37.7 % (ref 36.0–46.0)
Hemoglobin: 12.9 g/dL (ref 12.0–15.0)
MCH: 29.8 pg (ref 26.0–34.0)
MCHC: 34.2 g/dL (ref 30.0–36.0)
MCV: 87.1 fL (ref 80.0–100.0)
Platelets: 321 10*3/uL (ref 150–400)
RBC: 4.33 MIL/uL (ref 3.87–5.11)
RDW: 13 % (ref 11.5–15.5)
WBC: 8 10*3/uL (ref 4.0–10.5)
nRBC: 0 % (ref 0.0–0.2)

## 2019-06-01 LAB — POTASSIUM: Potassium: 3.8 mmol/L (ref 3.5–5.1)

## 2019-06-01 NOTE — Patient Instructions (Signed)
Your procedure is scheduled on:06/05/19 0745 AM [place]  .  Remember: Instructions that are not followed completely may result in serious medical risk,  up to and including death, or upon the discretion of your surgeon and anesthesiologist your  surgery may need to be rescheduled.     _X__ 1. Do not eat food after midnight the night before your procedure.                 No gum chewing or hard candies. You may drink clear liquids up to 2 hours                 before you are scheduled to arrive for your surgery- DO not drink clear                 liquids within 2 hours of the start of your surgery.                 Clear Liquids include:  water, apple juice without pulp, clear carbohydrate                 drink such as Clearfast of Gatorade, Black Coffee or Tea (Do not add                 anything to coffee or tea).  __X__2.  On the morning of surgery brush your teeth with toothpaste and water, you                may rinse your mouth with mouthwash if you wish.  Do not swallow any toothpaste of mouthwash.     _X__ 3.  No Alcohol for 24 hours before or after surgery.   _X__ 4.  Do Not Smoke or use e-cigarettes For 24 Hours Prior to Your Surgery.                 Do not use any chewable tobacco products for at least 6 hours prior to                 surgery.  ____  5.  Bring all medications with you on the day of surgery if instructed.   _X__  6.  Notify your doctor if there is any change in your medical condition      (cold, fever, infections).     Do not wear jewelry, make-up, hairpins, clips or nail polish. Do not wear lotions, powders, or perfumes. You may wear deodorant. Do not shave 48 hours prior to surgery. Men may shave face and neck. Do not bring valuables to the hospital.    [facility] is not responsible for any belongings or valuables.  Contacts, dentures or bridgework may not be worn into surgery. Leave your suitcase in the car. After surgery  it may be brought to your room. For patients admitted to the hospital, discharge time is determined by your treatment team.   Patients discharged the day of surgery will not be allowed to drive home.   Please read over the following fact sheets that you were given:   Surgical Site Infection Prevention          ____ Take these medicines the morning of surgery with A SIP OF WATER:    1. ATORVASTATIN  2.   3.   4.  5.  6.  ____ Fleet Enema (as directed)   _X___ Use CHG Soap as directed  ____ Use inhalers on the day of surgery  ____ Stop metformin  2 days prior to surgery    ____ Take 1/2 of usual insulin dose the night before surgery. No insulin the morning          of surgery.   ____ Stop Coumadin/Plavix/aspirin on   ____ Stop Anti-inflammatories on   ____ Stop supplements until after surgery.    ____ Bring C-Pap to the hospital.   MOM CAN CALL [phone] FOR UPDATES WHILE HERE FOR SURGERY

## 2019-06-01 NOTE — Pre-Procedure Instructions (Signed)
EKG OK BY DR P CARROLL

## 2019-06-02 LAB — SARS CORONAVIRUS 2 (TAT 6-24 HRS): SARS Coronavirus 2: NEGATIVE

## 2019-06-04 MED ORDER — CEFAZOLIN SODIUM-DEXTROSE 2-4 GM/100ML-% IV SOLN
2.0000 g | INTRAVENOUS | Status: AC
  Administered 2019-06-05: 2 g via INTRAVENOUS

## 2019-06-05 ENCOUNTER — Ambulatory Visit: Payer: Medicare Other

## 2019-06-05 ENCOUNTER — Ambulatory Visit: Payer: Medicare Other | Admitting: Anesthesiology

## 2019-06-05 ENCOUNTER — Ambulatory Visit
Admission: RE | Admit: 2019-06-05 | Discharge: 2019-06-05 | Disposition: A | Discharge status: Home / Self Care | Payer: Medicare Other | Source: Ambulatory Visit | Attending: General Surgery | Admitting: General Surgery

## 2019-06-05 ENCOUNTER — Ambulatory Visit
Admission: RE | Admit: 2019-06-05 | Discharge: 2019-06-05 | Disposition: A | Discharge status: Home / Self Care | Payer: Medicare Other | Attending: General Surgery | Admitting: General Surgery

## 2019-06-05 ENCOUNTER — Ambulatory Visit (HOSPITAL_BASED_OUTPATIENT_CLINIC_OR_DEPARTMENT_OTHER)
Admission: RE | Admit: 2019-06-05 | Discharge: 2019-06-05 | Disposition: A | Discharge status: Home / Self Care | Payer: Medicare Other | Source: Ambulatory Visit | Attending: General Surgery | Admitting: General Surgery

## 2019-06-05 ENCOUNTER — Other Ambulatory Visit: Payer: Self-pay

## 2019-06-05 ENCOUNTER — Encounter
Admission: RE | Disposition: A | Discharge status: Home / Self Care | Payer: Self-pay | Source: Home / Self Care | Attending: General Surgery

## 2019-06-05 DIAGNOSIS — C773 Secondary and unspecified malignant neoplasm of axilla and upper limb lymph nodes: Secondary | ICD-10-CM | POA: Insufficient documentation

## 2019-06-05 DIAGNOSIS — D0512 Intraductal carcinoma in situ of left breast: Secondary | ICD-10-CM | POA: Diagnosis not present

## 2019-06-05 DIAGNOSIS — Z79899 Other long term (current) drug therapy: Secondary | ICD-10-CM | POA: Diagnosis not present

## 2019-06-05 DIAGNOSIS — Z803 Family history of malignant neoplasm of breast: Secondary | ICD-10-CM | POA: Diagnosis not present

## 2019-06-05 DIAGNOSIS — C50412 Malignant neoplasm of upper-outer quadrant of left female breast: Secondary | ICD-10-CM

## 2019-06-05 DIAGNOSIS — Z170 Estrogen receptor positive status [ER+]: Secondary | ICD-10-CM

## 2019-06-05 DIAGNOSIS — Z91013 Allergy to seafood: Secondary | ICD-10-CM | POA: Insufficient documentation

## 2019-06-05 DIAGNOSIS — E785 Hyperlipidemia, unspecified: Secondary | ICD-10-CM | POA: Insufficient documentation

## 2019-06-05 DIAGNOSIS — Z882 Allergy status to sulfonamides status: Secondary | ICD-10-CM | POA: Insufficient documentation

## 2019-06-05 DIAGNOSIS — Z8371 Family history of colonic polyps: Secondary | ICD-10-CM | POA: Insufficient documentation

## 2019-06-05 DIAGNOSIS — Z8042 Family history of malignant neoplasm of prostate: Secondary | ICD-10-CM | POA: Diagnosis not present

## 2019-06-05 DIAGNOSIS — Z833 Family history of diabetes mellitus: Secondary | ICD-10-CM | POA: Diagnosis not present

## 2019-06-05 DIAGNOSIS — E890 Postprocedural hypothyroidism: Secondary | ICD-10-CM | POA: Diagnosis not present

## 2019-06-05 DIAGNOSIS — Z7982 Long term (current) use of aspirin: Secondary | ICD-10-CM | POA: Insufficient documentation

## 2019-06-05 DIAGNOSIS — Z8249 Family history of ischemic heart disease and other diseases of the circulatory system: Secondary | ICD-10-CM | POA: Insufficient documentation

## 2019-06-05 DIAGNOSIS — Z90710 Acquired absence of both cervix and uterus: Secondary | ICD-10-CM | POA: Diagnosis not present

## 2019-06-05 DIAGNOSIS — C50912 Malignant neoplasm of unspecified site of left female breast: Secondary | ICD-10-CM | POA: Diagnosis present

## 2019-06-05 DIAGNOSIS — I10 Essential (primary) hypertension: Secondary | ICD-10-CM | POA: Insufficient documentation

## 2019-06-05 DIAGNOSIS — Z9049 Acquired absence of other specified parts of digestive tract: Secondary | ICD-10-CM | POA: Insufficient documentation

## 2019-06-05 HISTORY — PX: BREAST BIOPSY WITH SENTINEL LYMPH NODE BIOPSY AND NEEDLE LOCALIZATION: SHX6326

## 2019-06-05 SURGERY — BREAST BIOPSY WITH SENTINEL LYMPH NODE BIOPSY AND NEEDLE LOCALIZATION
Anesthesia: General | Laterality: Left

## 2019-06-05 MED ORDER — KETOROLAC TROMETHAMINE 30 MG/ML IJ SOLN
INTRAMUSCULAR | Status: AC
  Filled 2019-06-05: qty 1

## 2019-06-05 MED ORDER — PROPOFOL 10 MG/ML IV BOLUS
INTRAVENOUS | Status: AC
  Filled 2019-06-05: qty 20

## 2019-06-05 MED ORDER — PROPOFOL 10 MG/ML IV BOLUS
INTRAVENOUS | Status: DC | PRN
  Administered 2019-06-05: 150 mg via INTRAVENOUS

## 2019-06-05 MED ORDER — TECHNETIUM TC 99M SULFUR COLLOID FILTERED
1.0000 | Freq: Once | INTRAVENOUS | Status: AC | PRN
  Administered 2019-06-05: 0.879 via INTRADERMAL

## 2019-06-05 MED ORDER — BUPIVACAINE-EPINEPHRINE (PF) 0.5% -1:200000 IJ SOLN
INTRAMUSCULAR | Status: DC | PRN
  Administered 2019-06-05: 30 mL

## 2019-06-05 MED ORDER — ONDANSETRON HCL 4 MG/2ML IJ SOLN
INTRAMUSCULAR | Status: DC | PRN
  Administered 2019-06-05: 4 mg via INTRAVENOUS

## 2019-06-05 MED ORDER — MIDAZOLAM HCL 2 MG/2ML IJ SOLN
INTRAMUSCULAR | Status: AC
  Filled 2019-06-05: qty 2

## 2019-06-05 MED ORDER — EPHEDRINE SULFATE 50 MG/ML IJ SOLN
INTRAMUSCULAR | Status: AC
  Filled 2019-06-05: qty 1

## 2019-06-05 MED ORDER — ACETAMINOPHEN 10 MG/ML IV SOLN
INTRAVENOUS | Status: DC | PRN
  Administered 2019-06-05: 1000 mg via INTRAVENOUS

## 2019-06-05 MED ORDER — LIDOCAINE HCL (CARDIAC) PF 100 MG/5ML IV SOSY
PREFILLED_SYRINGE | INTRAVENOUS | Status: DC | PRN
  Administered 2019-06-05: 100 mg via INTRAVENOUS

## 2019-06-05 MED ORDER — METHYLENE BLUE 0.5 % INJ SOLN
INTRAVENOUS | Status: AC
  Filled 2019-06-05: qty 10

## 2019-06-05 MED ORDER — BUPIVACAINE-EPINEPHRINE (PF) 0.5% -1:200000 IJ SOLN
INTRAMUSCULAR | Status: AC
  Filled 2019-06-05: qty 30

## 2019-06-05 MED ORDER — FENTANYL CITRATE (PF) 100 MCG/2ML IJ SOLN
INTRAMUSCULAR | Status: DC | PRN
  Administered 2019-06-05 (×10): 25 ug via INTRAVENOUS

## 2019-06-05 MED ORDER — ONDANSETRON HCL 4 MG/2ML IJ SOLN: 4.0000 mg | Freq: Once | INTRAMUSCULAR | Status: DC | PRN

## 2019-06-05 MED ORDER — EPHEDRINE SULFATE 50 MG/ML IJ SOLN
INTRAMUSCULAR | Status: DC | PRN
  Administered 2019-06-05: 15 mg via INTRAVENOUS
  Administered 2019-06-05: 10 mg via INTRAVENOUS

## 2019-06-05 MED ORDER — PHENYLEPHRINE HCL (PRESSORS) 10 MG/ML IV SOLN
INTRAVENOUS | Status: DC | PRN
  Administered 2019-06-05: 150 ug via INTRAVENOUS
  Administered 2019-06-05 (×2): 100 ug via INTRAVENOUS

## 2019-06-05 MED ORDER — ALBUTEROL SULFATE HFA 108 (90 BASE) MCG/ACT IN AERS
INHALATION_SPRAY | RESPIRATORY_TRACT | Status: AC
  Filled 2019-06-05: qty 6.7

## 2019-06-05 MED ORDER — FENTANYL CITRATE (PF) 250 MCG/5ML IJ SOLN
INTRAMUSCULAR | Status: AC
  Filled 2019-06-05: qty 5

## 2019-06-05 MED ORDER — GABAPENTIN 300 MG PO CAPS
300.0000 mg | ORAL_CAPSULE | ORAL | Status: AC
  Administered 2019-06-05: 09:00:00 300 mg via ORAL

## 2019-06-05 MED ORDER — HYDROCODONE-ACETAMINOPHEN 5-325 MG PO TABS: 1.0000 | ORAL_TABLET | ORAL | 0 refills | Status: DC | PRN

## 2019-06-05 MED ORDER — ALBUTEROL SULFATE HFA 108 (90 BASE) MCG/ACT IN AERS
INHALATION_SPRAY | RESPIRATORY_TRACT | Status: DC | PRN
  Administered 2019-06-05: 4 via RESPIRATORY_TRACT

## 2019-06-05 MED ORDER — PHENYLEPHRINE HCL (PRESSORS) 10 MG/ML IV SOLN
INTRAVENOUS | Status: AC
  Filled 2019-06-05: qty 1

## 2019-06-05 MED ORDER — KETOROLAC TROMETHAMINE 30 MG/ML IJ SOLN
INTRAMUSCULAR | Status: DC | PRN
  Administered 2019-06-05: 15 mg via INTRAVENOUS

## 2019-06-05 MED ORDER — ACETAMINOPHEN 10 MG/ML IV SOLN
INTRAVENOUS | Status: AC
  Filled 2019-06-05: qty 100

## 2019-06-05 MED ORDER — LACTATED RINGERS IV SOLN
INTRAVENOUS | Status: DC
  Administered 2019-06-05: 10:00:00 via INTRAVENOUS

## 2019-06-05 MED ORDER — FENTANYL CITRATE (PF) 100 MCG/2ML IJ SOLN: 25.0000 ug | INTRAMUSCULAR | Status: DC | PRN

## 2019-06-05 MED ORDER — GABAPENTIN 300 MG PO CAPS
ORAL_CAPSULE | ORAL | Status: AC
  Administered 2019-06-05: 300 mg via ORAL
  Filled 2019-06-05: qty 1

## 2019-06-05 MED ORDER — DEXAMETHASONE SODIUM PHOSPHATE 10 MG/ML IJ SOLN
INTRAMUSCULAR | Status: DC | PRN
  Administered 2019-06-05: 10 mg via INTRAVENOUS

## 2019-06-05 MED ORDER — CEFAZOLIN SODIUM-DEXTROSE 2-4 GM/100ML-% IV SOLN
INTRAVENOUS | Status: AC
  Filled 2019-06-05: qty 100

## 2019-06-05 MED ORDER — DEXMEDETOMIDINE HCL 200 MCG/2ML IV SOLN
INTRAVENOUS | Status: DC | PRN
  Administered 2019-06-05: 8 ug via INTRAVENOUS
  Administered 2019-06-05: 4 ug via INTRAVENOUS
  Administered 2019-06-05: 8 ug via INTRAVENOUS

## 2019-06-05 MED ORDER — METHYLENE BLUE 0.5 % INJ SOLN
INTRAVENOUS | Status: DC | PRN
  Administered 2019-06-05: 5 mL

## 2019-06-05 MED ORDER — DEXAMETHASONE SODIUM PHOSPHATE 10 MG/ML IJ SOLN
INTRAMUSCULAR | Status: AC
  Filled 2019-06-05: qty 1

## 2019-06-05 MED ORDER — FAMOTIDINE 20 MG PO TABS
ORAL_TABLET | ORAL | Status: AC
  Administered 2019-06-05: 20 mg via ORAL
  Filled 2019-06-05: qty 1

## 2019-06-05 MED ORDER — FAMOTIDINE 20 MG PO TABS
20.0000 mg | ORAL_TABLET | Freq: Once | ORAL | Status: AC
  Administered 2019-06-05: 09:00:00 20 mg via ORAL

## 2019-06-05 MED ORDER — ONDANSETRON HCL 4 MG/2ML IJ SOLN
INTRAMUSCULAR | Status: AC
  Filled 2019-06-05: qty 2

## 2019-06-05 MED ORDER — MIDAZOLAM HCL 2 MG/2ML IJ SOLN
INTRAMUSCULAR | Status: DC | PRN
  Administered 2019-06-05: 2 mg via INTRAVENOUS

## 2019-06-05 MED ORDER — LIDOCAINE HCL (PF) 2 % IJ SOLN
INTRAMUSCULAR | Status: AC
  Filled 2019-06-05: qty 10

## 2019-06-05 SURGICAL SUPPLY — 55 items
BINDER BREAST LRG (GAUZE/BANDAGES/DRESSINGS) IMPLANT
BINDER BREAST MEDIUM (GAUZE/BANDAGES/DRESSINGS) IMPLANT
BINDER BREAST XLRG (GAUZE/BANDAGES/DRESSINGS) IMPLANT
BINDER BREAST XXLRG (GAUZE/BANDAGES/DRESSINGS) IMPLANT
BLADE SURG 15 STRL SS SAFETY (BLADE) ×6 IMPLANT
BULB RESERV EVAC DRAIN JP 100C (MISCELLANEOUS) IMPLANT
CANISTER SUCT 1200ML W/VALVE (MISCELLANEOUS) ×3 IMPLANT
CHLORAPREP W/TINT 26 (MISCELLANEOUS) ×3 IMPLANT
CLOSURE WOUND 1/2 X4 (GAUZE/BANDAGES/DRESSINGS) ×1
CNTNR SPEC 2.5X3XGRAD LEK (MISCELLANEOUS)
CONT SPEC 4OZ STER OR WHT (MISCELLANEOUS)
CONTAINER SPEC 2.5X3XGRAD LEK (MISCELLANEOUS) IMPLANT
COVER PROBE FLX POLY STRL (MISCELLANEOUS) ×3 IMPLANT
COVER WAND RF STERILE (DRAPES) ×3 IMPLANT
DEVICE DUBIN SPECIMEN MAMMOGRA (MISCELLANEOUS) ×3 IMPLANT
DRAIN CHANNEL JP 15F RND 16 (MISCELLANEOUS) IMPLANT
DRAPE LAPAROTOMY TRNSV 106X77 (MISCELLANEOUS) ×3 IMPLANT
DRSG GAUZE FLUFF 36X18 (GAUZE/BANDAGES/DRESSINGS) ×6 IMPLANT
DRSG TELFA 3X8 NADH (GAUZE/BANDAGES/DRESSINGS) ×3 IMPLANT
ELECT CAUTERY BLADE TIP 2.5 (TIP) ×6
ELECT REM PT RETURN 9FT ADLT (ELECTROSURGICAL) ×3
ELECTRODE CAUTERY BLDE TIP 2.5 (TIP) ×2 IMPLANT
ELECTRODE REM PT RTRN 9FT ADLT (ELECTROSURGICAL) ×1 IMPLANT
GAUZE SPONGE 4X4 12PLY STRL (GAUZE/BANDAGES/DRESSINGS) ×3 IMPLANT
GLOVE BIO SURGEON STRL SZ7.5 (GLOVE) ×12 IMPLANT
GLOVE INDICATOR 8.0 STRL GRN (GLOVE) ×15 IMPLANT
GOWN STRL REUS W/ TWL LRG LVL3 (GOWN DISPOSABLE) ×3 IMPLANT
GOWN STRL REUS W/TWL LRG LVL3 (GOWN DISPOSABLE) ×6
KIT TURNOVER KIT A (KITS) ×3 IMPLANT
LABEL OR SOLS (LABEL) ×3 IMPLANT
MARGIN MAP 10MM (MISCELLANEOUS) ×3 IMPLANT
NEEDLE HYPO 22GX1.5 SAFETY (NEEDLE) ×3 IMPLANT
NEEDLE HYPO 25X1 1.5 SAFETY (NEEDLE) ×6 IMPLANT
PACK BASIN MINOR ARMC (MISCELLANEOUS) ×3 IMPLANT
RETRACTOR RING XSMALL (MISCELLANEOUS) ×1 IMPLANT
RTRCTR WOUND ALEXIS 13CM XS SH (MISCELLANEOUS) ×3
SHEARS FOC LG CVD HARMONIC 17C (MISCELLANEOUS) IMPLANT
SHEARS HARMONIC 9CM CVD (BLADE) IMPLANT
SLEVE PROBE SENORX GAMMA FIND (MISCELLANEOUS) ×3 IMPLANT
STRIP CLOSURE SKIN 1/2X4 (GAUZE/BANDAGES/DRESSINGS) ×2 IMPLANT
SUT ETHILON 3-0 FS-10 30 BLK (SUTURE) ×3
SUT SILK 2 0 (SUTURE) ×2
SUT SILK 2-0 18XBRD TIE 12 (SUTURE) ×1 IMPLANT
SUT VIC AB 2-0 CT1 27 (SUTURE) ×6
SUT VIC AB 2-0 CT1 TAPERPNT 27 (SUTURE) ×3 IMPLANT
SUT VIC AB 3-0 SH 27 (SUTURE) ×4
SUT VIC AB 3-0 SH 27X BRD (SUTURE) ×2 IMPLANT
SUT VIC AB 4-0 FS2 27 (SUTURE) ×6 IMPLANT
SUT VICRYL+ 3-0 144IN (SUTURE) ×3 IMPLANT
SUTURE EHLN 3-0 FS-10 30 BLK (SUTURE) ×1 IMPLANT
SWABSTK COMLB BENZOIN TINCTURE (MISCELLANEOUS) ×3 IMPLANT
SYR 10ML LL (SYRINGE) ×3 IMPLANT
SYR BULB IRRIG 60ML STRL (SYRINGE) ×3 IMPLANT
TAPE TRANSPORE STRL 2 31045 (GAUZE/BANDAGES/DRESSINGS) ×3 IMPLANT
WATER STERILE IRR 1000ML POUR (IV SOLUTION) ×3 IMPLANT

## 2019-06-05 NOTE — Anesthesia Procedure Notes (Signed)
Procedure Name: LMA Insertion Date/Time: 06/05/2019 11:59 AM Performed by: Eben Burow, CRNA Pre-anesthesia Checklist: Patient identified, Emergency Drugs available, Suction available and Patient being monitored Patient Re-evaluated:Patient Re-evaluated prior to induction Oxygen Delivery Method: Circle system utilized Preoxygenation: Pre-oxygenation with 100% oxygen Induction Type: IV induction Ventilation: Mask ventilation without difficulty LMA: LMA inserted LMA Size: 4.0 Number of attempts: 1 Placement Confirmation: positive ETCO2 and breath sounds checked- equal and bilateral Tube secured with: Tape Dental Injury: Teeth and Oropharynx as per pre-operative assessment

## 2019-06-05 NOTE — Op Note (Signed)
Preoperative diagnosis: Multifocal left breast cancer.  Postoperative diagnosis: Same.  Operative procedure: Wire/ultrasound localized wide excision of the left breast with tissue transfer and sentinel node biopsy.  Operating Surgeon: Hervey Ard, MD.  Anesthesia: General by LMA, Marcaine 0.5% with 1-200,000's of epinephrine, 30 cc.  Estimated blood loss: 20 cc.  Clinical note: This 68 year old woman recently had mammograms completed that showed multiple areas of concern.  She subsequently had undergone 4 biopsies to showing invasive mammary carcinoma and to showing evidence of carcinoma in situ.  She elected to proceed to breast conservation.  She underwent localizing wires this morning to bracket the anterior and posterior aspects of the lesions that were all in a fairly narrow area of the 2 to 4 o'clock position.  She tolerated this well.  She also underwent injection with technetium sulfur colloid the morning of the procedure.  This was well-tolerated having used EMLA cream prior to the injection.  The patient received cephalosporins IV prior to the procedure.  SCD stockings for DVT prevention.  Operative note: With the patient under adequate general anesthesia the breast was cleansed with alcohol and a total of 5 cc of 0.5% methylene blue was injected in subareolar plexus.  The breast chest and axilla was then cleansed with ChloraPrep and draped.  Ultrasound was used to identify the course of the localizing wires which in the position with the arm extended in the operating room was about the 3 o'clock position.  The inferior and superior aspects were marked and a radial incision at the 3 o'clock position was made after injection of local anesthesia.  The skin was incised sharply and the adipose tissue elevated off the breast parenchyma.  A extra small Alexis wound protector was placed and a 5 x 5 x 5 cm block of tissue was excised and sent for specimen radiograph.  This showed 3-4 clips and  2 intact wire tips.  While the breast specimen was being processed attention was turned to the axilla.  The node seeker device was used to identify the area of modest uptake in the inferior aspect of the axilla.  Local anesthesia was infiltrated and a transverse incision made.  The skin was incised sharply and the remaining dissection completed with electrocautery.  Again the Alexis wound protector was placed in the axillary envelope opened.  A blue lymphatic was led to a single hot blue node with counts of 700.  No additional nodes were identified after extensive dissection.  The wound was closed in layers with 2-0 Vicryl figure-of-eight sutures deep and a running 2-0 Vicryl suture to the adipose layer.  The skin was closed with a running 4-0 Vicryl subcuticular suture.  With discussion with the radiologist as to the missing heart-shaped clip a new piece of tissue measuring 1 cm in thickness and about 3 x 5 cm in size including the superior and lateral border of the wide excision site was resected.  And the clip was visualized within this on visual inspection.  This was confirmed on specimen radiograph.  There was a fair soft tissue defect present and the inferior breast tissue was elevated off the underlying pectoralis fascia.  This was then brought up into the 3 o'clock position with interrupted 2-0 Vicryl figure-of-eight sutures.  The superior aspect of the wound was elevated there is a 6 mm thick flap and the deep tissue approximated with a second layer of 2-0 Vicryl figure-of-eight sutures.  The skin was closed with a running 4-0 Vicryl subcuticular suture.  Benzoin Steri-Strips were applied followed by fluff gauze and a compressive wrap.  The patient tolerated the procedure well and was taken recovery room in stable condition.

## 2019-06-05 NOTE — H&P (Signed)
No change in clinical history or exam.

## 2019-06-05 NOTE — Anesthesia Post-op Follow-up Note (Signed)
Anesthesia QCDR form completed.        

## 2019-06-05 NOTE — Anesthesia Preprocedure Evaluation (Signed)
Anesthesia Evaluation  Patient identified by MRN, date of birth, ID band Patient awake    Reviewed: Allergy & Precautions, H&P , NPO status , Patient's Chart, lab work & pertinent test results, reviewed documented beta blocker date and time   History of Anesthesia Complications (+) PONV and history of anesthetic complications  Airway Mallampati: II  TM Distance: >3 FB Neck ROM: full    Dental  (+) Teeth Intact   Pulmonary neg pulmonary ROS,    Pulmonary exam normal        Cardiovascular Exercise Tolerance: Good hypertension, On Medications negative cardio ROS Normal cardiovascular exam Rate:Normal     Neuro/Psych negative neurological ROS  negative psych ROS   GI/Hepatic negative GI ROS, Neg liver ROS,   Endo/Other  negative endocrine ROS  Renal/GU negative Renal ROS  negative genitourinary   Musculoskeletal   Abdominal   Peds  Hematology negative hematology ROS (+)   Anesthesia Other Findings   Reproductive/Obstetrics negative OB ROS                             Anesthesia Physical Anesthesia Plan  ASA: II  Anesthesia Plan: General LMA   Post-op Pain Management:    Induction:   PONV Risk Score and Plan:   Airway Management Planned:   Additional Equipment:   Intra-op Plan:   Post-operative Plan:   Informed Consent: I have reviewed the patients History and Physical, chart, labs and discussed the procedure including the risks, benefits and alternatives for the proposed anesthesia with the patient or authorized representative who has indicated his/her understanding and acceptance.       Plan Discussed with: CRNA  Anesthesia Plan Comments:         Anesthesia Quick Evaluation

## 2019-06-05 NOTE — Transfer of Care (Signed)
Immediate Anesthesia Transfer of Care Note  Patient: Amanda Wade  Procedure(s) Performed: BREAST BIOPSY WITH SENTINEL LYMPH NODE BIOPSY AND NEEDLE LOCALIZATION, LEFT, BRACKETING WIRE (Left )  Patient Location: PACU  Anesthesia Type:General  Level of Consciousness: awake, alert , oriented and patient cooperative  Airway & Oxygen Therapy: Patient Spontanous Breathing and Patient connected to face mask oxygen  Post-op Assessment: Report given to RN and Post -op Vital signs reviewed and stable  Post vital signs: Reviewed and stable  Last Vitals:  Vitals Value Taken Time  BP 112/59 06/05/19 1422  Temp 36.4 C 06/05/19 1421  Pulse 74 06/05/19 1427  Resp 13 06/05/19 1427  SpO2 98 % 06/05/19 1427  Vitals shown include unvalidated device data.  Last Pain:  Vitals:   06/05/19 1421  TempSrc:   PainSc: Asleep         Complications: No apparent anesthesia complications

## 2019-06-05 NOTE — Discharge Instructions (Signed)
Breast Biopsy, Care After These instructions give you information about caring for yourself after your procedure. Your doctor may also give you more specific instructions. Call your doctor if you have any problems or questions after your procedure. What can I expect after the procedure? After your procedure, it is common to have:  Bruising on your breast.  Numbness, tingling, or pain near your biopsy site. Follow these instructions at home: Medicines  Take over-the-counter and prescription medicines only as told by your doctor.  Do not drive for 24 hours if you were given a medicine to help you relax (sedative) during your procedure.  Do not drink alcohol while taking pain medicine.  Do not drive or use heavy machinery while taking prescription pain medicine. Biopsy site care      Follow instructions from your doctor about how to take care of your cut from surgery (incision) or your puncture area. Make sure you: ? Wash your hands with soap and water before you change your bandage (dressing). If you cannot use soap and water, use hand sanitizer. ? Change your bandage as told by your doctor. ? Leave stitches (sutures), skin glue, or skin tape (adhesive strips) in place. They may need to stay in place for 2 weeks or longer. If tape strips get loose and curl up, you may trim the loose edges. Do not remove tape strips completely unless your doctor says it is okay.  If you have stitches, keep them dry when you take a bath or a shower.  Check your cut or puncture area every day for signs of infection. Check for: ? Redness, swelling, or pain. ? Fluid or blood. ? Warmth. ? Pus or a bad smell.  Protect the biopsy area. Do not let the area get bumped. Activity  If you had a cut during your procedure, avoid activities that could pull your cut open. These include: ? Stretching. ? Reaching over your head. ? Exercise. ? Sports. ? Lifting anything that weighs more than 3 lb (1.4  kg).  Return to your normal activities as told by your doctor. Ask your doctor what activities are safe for you. Managing pain, stiffness, and swelling If told, put ice on the biopsy site to relieve swelling:  Put ice in a plastic bag.  Place a towel between your skin and the bag.  Leave the ice on for 20 minutes, 2-3 times a day. General instructions  Continue your normal diet.  Wear a good support bra for as long as told by your doctor.  Get checked for extra fluid around your lymph nodes (lymphedema) as often as told by your doctor.  Keep all follow-up visits as told by your doctor. This is important. Contact a doctor if:  You notice any of the following at the biopsy site: ? More redness, swelling, or pain. ? More fluid or blood coming from the site. ? The site feels warm to the touch. ? Pus or a bad smell coming from the site. ? The site breaks open after the stitches or skin tape strips have been removed.  You have a rash.  You have a fever. Get help right away if:  You have more bleeding from the biopsy site. Get help right away if bleeding is more than a small spot.  You have trouble breathing.  You have red streaks around the biopsy site. Summary  After your procedure, it is common to have bruising, numbness, tingling, or pain near the biopsy site.  Do not drive  or use heavy machinery while taking prescription pain medicine.  Wear a good support bra for as long as told by your doctor.  If you had a cut during your procedure, avoid activities that may pull the cut open. Ask your doctor what activities are safe for you. This information is not intended to replace advice given to you by your health care provider. Make sure you discuss any questions you have with your health care provider. Document Released: 09/05/2009 Document Revised: 04/28/2018 Document Reviewed: 04/28/2018 Elsevier Patient Education  2020 [employer]   1) The drugs that you were given will stay in your system until tomorrow so for the next 24 hours you should not:  A) Drive an automobile B) Make any legal decisions C) Drink any alcoholic beverage   2) You may resume regular meals tomorrow.  Today it is better to start with liquids and gradually work up to solid foods.  You may eat anything you prefer, but it is better to start with liquids, then soup and crackers, and gradually work up to solid foods.   3) Please notify your doctor immediately if you have any unusual bleeding, trouble breathing, redness and pain at the surgery site, drainage, fever, or pain not relieved by medication.    4) Additional Instructions:        Please contact your physician with any problems or Same Day Surgery at [phone], Monday through Friday 6 am to 4 pm, or [place] at [facility] number at [phone].

## 2019-06-05 NOTE — OR Nursing (Signed)
Son called and given update with times.

## 2019-06-06 ENCOUNTER — Encounter: Payer: Self-pay | Admitting: General Surgery

## 2019-06-08 ENCOUNTER — Ambulatory Visit: Payer: Medicare Other | Admitting: General Surgery

## 2019-06-08 ENCOUNTER — Encounter: Payer: Self-pay | Admitting: General Surgery

## 2019-06-08 ENCOUNTER — Other Ambulatory Visit: Payer: Self-pay

## 2019-06-08 NOTE — Progress Notes (Unsigned)
Patient ID: Amanda Wade, female   DOB: [birth year], 68 y.o.   MRN: [id number]  Chief Complaint  Patient presents with  . Routine Post Op    HPI Amanda Wade is a 68 y.o. female here today for her post op left breast biopsy done on 06/05/2019.  HPI  Past Medical History:  Diagnosis Date  . Complication of anesthesia   . Hyperlipidemia   . Hypertension   . Malignant neoplasm of upper-outer quadrant of left breast in female, estrogen receptor positive ([place]) 05/19/2019  . PONV (postoperative nausea and vomiting)   . Vertigo    intermittent especially lying on left side    Past Surgical History:  Procedure Laterality Date  . ABDOMINAL HYSTERECTOMY  2004   complete  . BREAST BIOPSY Left 05/05/2019   [facility]- ribbon clip  . BREAST BIOPSY Left 05/16/2019   [facility]-DCIS  . BREAST BIOPSY Left 05/16/2019   [facility]- X clip  . BREAST BIOPSY WITH SENTINEL LYMPH NODE BIOPSY AND NEEDLE LOCALIZATION Left 06/05/2019   Procedure: BREAST BIOPSY WITH SENTINEL LYMPH NODE BIOPSY AND NEEDLE LOCALIZATION, LEFT, BRACKETING WIRE;  Surgeon: Robert Bellow, MD;  Location: [facility] ORS;  Service: General;  Laterality: Left;  . BREAST CYST ASPIRATION Bilateral age 60   neg  . CHOLECYSTECTOMY    . COLONOSCOPY WITH PROPOFOL N/A 08/02/2018   Procedure: COLONOSCOPY WITH PROPOFOL;  Surgeon: [name], MD;  Location: [facility] ENDOSCOPY;  Service: Endoscopy;  Laterality: N/A;  . DILATION AND CURETTAGE OF UTERUS    . goiter surgery    . OOPHORECTOMY    . THYROIDECTOMY, PARTIAL      Family History  Problem Relation Age of Onset  . Breast cancer Cousin        mat cousin  . Hypertension Mother   . Heart attack Father   . Prostate cancer Father   . Diabetes Father   . Colon polyps Father     Social History Social History   Tobacco Use  . Smoking status: Never Smoker  . Smokeless tobacco: Never Used  Substance Use Topics  . Alcohol use: No  . Drug use: No    Allergies  Allergen Reactions  .  Shellfish Allergy Rash  . Sulfa Antibiotics Rash    Current Outpatient Medications  Medication Sig Dispense Refill  . aspirin EC 81 MG tablet Take 81 mg by mouth daily.    Marland Kitchen atorvastatin (LIPITOR) 10 MG tablet Take 10 mg by mouth daily.     . Calcium Carbonate-Vitamin D 600-400 MG-UNIT tablet Take 1 tablet by mouth daily.     Marland Kitchen HYDROcodone-acetaminophen (NORCO/VICODIN) 5-325 MG tablet Take 1 tablet by mouth every 4 (four) hours as needed for moderate pain. 15 tablet 0  . lisinopril-hydrochlorothiazide (PRINZIDE,ZESTORETIC) 20-12.5 MG tablet Take 1 tablet by mouth daily.    . Multiple Vitamins-Minerals (CENTRUM SILVER PO) Take 1 tablet by mouth daily.      No current facility-administered medications for this visit.     Review of Systems Review of Systems  Constitutional: Negative.   Respiratory: Negative.   Cardiovascular: Negative.     Blood pressure (!) 170/88, pulse 82, temperature (!) 97.3 F (36.3 C), temperature source Skin, height 5' (1.524 m), weight 209 lb (94.8 kg), SpO2 97 %.  Physical Exam Physical Exam Constitutional:      Appearance: Normal appearance.  Skin:    General: Skin is warm and dry.  Neurological:     General: No focal deficit present.  Mental Status: She is alert and oriented to person, place, and time.     Data Reviewed ***  Assessment ***  Plan  Return in two weeks. The patient is aware to call back for any questions or concerns.   HPI, Physical Exam, Assessment and Plan have been scribed under the direction and in the presence of Hervey Ard, MD.  Gaspar Cola, CMA  Gaspar Cola 06/08/2019, 9:14 AM

## 2019-06-08 NOTE — Patient Instructions (Signed)
You may remove your compression dressing to shower. Continue to wear your compression wrap.  Follow up in 2 weeks.

## 2019-06-12 ENCOUNTER — Telehealth: Payer: Self-pay | Admitting: *Deleted

## 2019-06-12 NOTE — Telephone Encounter (Signed)
Patient notified that her results were not back yet. I let her know we would call her once they were received.

## 2019-06-12 NOTE — Telephone Encounter (Signed)
Patient wants to know results for her breast biopsy that was done on 06/05/19 by Dr.Byrnett

## 2019-06-13 ENCOUNTER — Telehealth: Payer: Self-pay | Admitting: *Deleted

## 2019-06-13 NOTE — Telephone Encounter (Signed)
Patient called and wants to know when can she take her corset off, she had surgery on 06/05/19 . Please call and advise

## 2019-06-13 NOTE — Anesthesia Postprocedure Evaluation (Signed)
Anesthesia Post Note  Patient: Amanda Wade  Procedure(s) Performed: BREAST BIOPSY WITH SENTINEL LYMPH NODE BIOPSY AND NEEDLE LOCALIZATION, LEFT, BRACKETING WIRE (Left )  Patient location during evaluation: PACU Anesthesia Type: General Level of consciousness: awake and alert Pain management: pain level controlled Vital Signs Assessment: post-procedure vital signs reviewed and stable Respiratory status: spontaneous breathing, nonlabored ventilation, respiratory function stable and patient connected to nasal cannula oxygen Cardiovascular status: blood pressure returned to baseline and stable Postop Assessment: no apparent nausea or vomiting Anesthetic complications: no     Last Vitals:  Vitals:   06/05/19 1516 06/05/19 1537  BP: (!) 122/54 (!) 92/50  Pulse: 78   Resp: 18 18  Temp: (!) 36.4 C   SpO2: 94% 95%    Last Pain:  Vitals:   06/06/19 0923  TempSrc:   PainSc: 0-No pain                 Molli Barrows

## 2019-06-13 NOTE — Telephone Encounter (Signed)
Patient will wear coreset until her appt.. She will wash off.

## 2019-06-14 ENCOUNTER — Other Ambulatory Visit: Payer: Self-pay | Admitting: Anatomic Pathology & Clinical Pathology

## 2019-06-14 LAB — SURGICAL PATHOLOGY

## 2019-06-15 ENCOUNTER — Other Ambulatory Visit: Payer: Self-pay | Admitting: Pathology

## 2019-06-21 ENCOUNTER — Encounter: Payer: Self-pay | Admitting: Surgery

## 2019-06-21 ENCOUNTER — Other Ambulatory Visit: Payer: Self-pay

## 2019-06-21 ENCOUNTER — Ambulatory Visit (INDEPENDENT_AMBULATORY_CARE_PROVIDER_SITE_OTHER): Payer: Medicare Other | Admitting: Surgery

## 2019-06-21 VITALS — BP 132/74 | HR 84 | Temp 97.9°F | Ht 61.0 in | Wt 207.0 lb

## 2019-06-21 DIAGNOSIS — Z170 Estrogen receptor positive status [ER+]: Secondary | ICD-10-CM

## 2019-06-21 DIAGNOSIS — C50412 Malignant neoplasm of upper-outer quadrant of left female breast: Secondary | ICD-10-CM

## 2019-06-21 NOTE — Patient Instructions (Signed)
Send message for a breast MRI .

## 2019-06-22 ENCOUNTER — Inpatient Hospital Stay: Payer: Medicare Other | Admitting: Oncology

## 2019-06-22 ENCOUNTER — Encounter: Payer: Self-pay | Admitting: Surgery

## 2019-06-22 ENCOUNTER — Encounter: Payer: Medicare Other | Admitting: General Surgery

## 2019-06-22 NOTE — Progress Notes (Signed)
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   She did have reexcision during the same operative setting with clear margins.  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 He has been discussed at tumor conference because of extensive DCIS there is concern that she may be harboring some additional DCIS fossae within the same breast.  MRI therefore is recommended. She is otherwise doing well, no complaints from her surgery.  No fevers no chills  PE: NAD: Wounds healing well without evidence of infection or seroma.  There is no evidence of lymphedema.  A/p 68 year old status post lumpectomy and lymph node biopsy for multifocal breast cancer and extensive DCIS.  Given the extension of DCIS I do agree with performing MRI to assess for potential foci of DCIS that may need to be addressed.  Discussed with the patient in detail and she understands and agrees.  We will see her back after the MRI is done. D/W Dr. Janese Banks in detail.

## 2019-06-23 ENCOUNTER — Encounter: Payer: Self-pay | Admitting: Oncology

## 2019-06-26 ENCOUNTER — Encounter: Payer: Self-pay | Admitting: Oncology

## 2019-06-26 ENCOUNTER — Telehealth: Payer: Self-pay | Admitting: *Deleted

## 2019-06-26 NOTE — Telephone Encounter (Signed)
Spoke to [employer]. She will talk to her

## 2019-06-26 NOTE — Telephone Encounter (Signed)
I called pt and they moved the MRI up to 8/5, I called [facility] and they are faxing me results. So I made an appt for pt to see rao on 8/6 2:30. Pt agreeable and she will wait to get results to make decisions for future treatment

## 2019-06-26 NOTE — Telephone Encounter (Signed)
Patient called asking if she shouldn't go ahead and start taking hormones now since she cannot get her MRI until the 13 th. She is concerned that the cancer is growing and doe snot want to wait too long to start treatment. Please advise.

## 2019-06-28 ENCOUNTER — Ambulatory Visit
Admission: RE | Admit: 2019-06-28 | Discharge: 2019-06-28 | Disposition: A | Discharge status: Home / Self Care | Payer: Medicare Other | Source: Ambulatory Visit | Attending: Surgery | Admitting: Surgery

## 2019-06-28 ENCOUNTER — Other Ambulatory Visit: Payer: Self-pay

## 2019-06-28 DIAGNOSIS — Z170 Estrogen receptor positive status [ER+]: Secondary | ICD-10-CM | POA: Insufficient documentation

## 2019-06-28 DIAGNOSIS — C50412 Malignant neoplasm of upper-outer quadrant of left female breast: Secondary | ICD-10-CM | POA: Insufficient documentation

## 2019-06-28 LAB — POCT I-STAT CREATININE: Creatinine, Ser: 0.8 mg/dL (ref 0.44–1.00)

## 2019-06-28 MED ORDER — GADOBUTROL 1 MMOL/ML IV SOLN
9.0000 mL | Freq: Once | INTRAVENOUS | Status: AC | PRN
  Administered 2019-06-28: 9 mL via INTRAVENOUS

## 2019-06-29 ENCOUNTER — Encounter: Payer: Self-pay | Admitting: Oncology

## 2019-06-29 ENCOUNTER — Inpatient Hospital Stay: Payer: Medicare Other | Attending: Oncology | Admitting: Oncology

## 2019-06-29 ENCOUNTER — Other Ambulatory Visit: Payer: Self-pay

## 2019-06-29 VITALS — BP 136/84 | HR 92 | Temp 97.6°F | Resp 16 | Ht 61.0 in | Wt 207.1 lb

## 2019-06-29 DIAGNOSIS — Z7982 Long term (current) use of aspirin: Secondary | ICD-10-CM | POA: Insufficient documentation

## 2019-06-29 DIAGNOSIS — Z803 Family history of malignant neoplasm of breast: Secondary | ICD-10-CM | POA: Insufficient documentation

## 2019-06-29 DIAGNOSIS — Z90710 Acquired absence of both cervix and uterus: Secondary | ICD-10-CM | POA: Diagnosis not present

## 2019-06-29 DIAGNOSIS — C50412 Malignant neoplasm of upper-outer quadrant of left female breast: Secondary | ICD-10-CM | POA: Insufficient documentation

## 2019-06-29 DIAGNOSIS — Z79899 Other long term (current) drug therapy: Secondary | ICD-10-CM | POA: Diagnosis not present

## 2019-06-29 DIAGNOSIS — Z7189 Other specified counseling: Secondary | ICD-10-CM | POA: Diagnosis not present

## 2019-06-29 DIAGNOSIS — I10 Essential (primary) hypertension: Secondary | ICD-10-CM | POA: Diagnosis not present

## 2019-06-29 DIAGNOSIS — E785 Hyperlipidemia, unspecified: Secondary | ICD-10-CM | POA: Diagnosis not present

## 2019-06-29 DIAGNOSIS — Z170 Estrogen receptor positive status [ER+]: Secondary | ICD-10-CM

## 2019-06-29 NOTE — Progress Notes (Signed)
Here to get results of agendie and scan and figure out plan for pt.

## 2019-06-30 ENCOUNTER — Telehealth: Payer: Self-pay

## 2019-06-30 NOTE — Progress Notes (Signed)
Hematology/Oncology Consult note [facility]  Telephone:([phone] [phone]  Patient Care Team: Ezequiel Kayser, MD as PCP - General (Internal Medicine)   Name of the patient: Amanda Wade  [id number]  [birth year]   Date of visit: 06/30/19  Diagnosis-pathological prognostic stage IA [id number] Invasive mammary carcinoma of the left breast status post lumpectomy and sentinel lymph node biopsy  Chief complaint/ Reason for visit-discuss MRI and MammaPrint results and further management  Heme/Onc history: Patient is a 68 year old postmenopausal female with a past medical history significant for hypertension hyperlipidemia among other medical problems.  She recently underwent a screening mammogram on 04/28/2019 which showed possible mass in the left breast.  This was followed by diagnostic mammogram and ultrasound which showed bilobar hypo-Echoic mass at the 2:30 position of the left breast measuring 0.5 x 0.6 x 1.1 cm.  Hypoechoic mass at the 2 o'clock position as well.  And another bilobed irregular hypoechoic mass at the 3 o'clock position measuring 4 x 5 x 6 mm.  Patient had biopsy of all these 3 masses.  The 3 o'clock position breast mass came back as invasive mammary carcinoma 6 mm, grade 1 strongly ER PR positive greater than 90% and HER-2/neu negative.  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  230 breast mass also came back as intermediate grade DCIS.  There was also another mass noted in the left breast which was biopsied and was 5 mm grade 1 ER PR positive and HER-2/neu negative.  Patient was taken for lumpectomy and sentinel lymph node biopsy by Dr. Bary Castilla on 06/05/2019.Final pathology showed invasive mammary carcinoma grade 1 ER PR positive and HER-2/neu negative at 4 different biopsy sites with extensive DCIS predominantly micropapillary type.  One lymph node was also involved with macro metastatic carcinoma and was negative for  extranodal extension  Patient's case was discussed at tumor board and there was a concern that there may be potentially more areas of abnormality given extensive DCIS and invasive cancer seen in multiple foci in the lumpectomy specimen.  Plan was to proceed with bilateral MRI.Breast MRI showed a large area of clumped discontinued non-mass enhancement in the left upper breast spanning an area of 9.8 cm in AP dimension suspicious for residual DCIS or invasive carcinoma.  Patient also had a MammaPrint testing done on her lumpectomy specimen which came back as high risk group.  29% chance of recurrence without chemotherapy at 5 years.  Greater than 12% chemo benefit and estimated > 93% survival at 5 years with chemotherapy and hormone therapy.   Interval history-patient is nervous to hear about the results of her MRI and MammaPrint testing.  Overall she is doing well since her lumpectomy and denies any specific complaints at this time  ECOG PS- 1 Pain scale- 0   Review of systems- Review of Systems  Constitutional: Negative for chills, fever, malaise/fatigue and weight loss.  HENT: Negative for congestion, ear discharge and nosebleeds.   Eyes: Negative for blurred vision.  Respiratory: Negative for cough, hemoptysis, sputum production, shortness of breath and wheezing.   Cardiovascular: Negative for chest pain, palpitations, orthopnea and claudication.  Gastrointestinal: Negative for abdominal pain, blood in stool, constipation, diarrhea, heartburn, melena, nausea and vomiting.  Genitourinary: Negative for dysuria, flank pain, frequency, hematuria and urgency.  Musculoskeletal: Negative for back pain, joint pain and myalgias.  Skin: Negative for rash.  Neurological: Negative for dizziness, tingling, focal weakness, seizures, weakness and headaches.  Endo/Heme/Allergies: Does not bruise/bleed  easily.  Psychiatric/Behavioral: Negative for depression and suicidal ideas. The patient is  nervous/anxious. The patient does not have insomnia.        Allergies  Allergen Reactions  . Shellfish Allergy Rash  . Sulfa Antibiotics Rash     Past Medical History:  Diagnosis Date  . Complication of anesthesia   . Hyperlipidemia   . Hypertension   . Malignant neoplasm of upper-outer quadrant of left breast in female, estrogen receptor positive ([place]) 05/19/2019  . PONV (postoperative nausea and vomiting)   . Vertigo    intermittent especially lying on left side     Past Surgical History:  Procedure Laterality Date  . ABDOMINAL HYSTERECTOMY  2004   complete  . BREAST BIOPSY Left 05/05/2019   [facility]- ribbon clip  . BREAST BIOPSY Left 05/16/2019   [facility]-DCIS  . BREAST BIOPSY Left 05/16/2019   [facility]- X clip  . BREAST BIOPSY WITH SENTINEL LYMPH NODE BIOPSY AND NEEDLE LOCALIZATION Left 06/05/2019   Procedure: BREAST BIOPSY WITH SENTINEL LYMPH NODE BIOPSY AND NEEDLE LOCALIZATION, LEFT, BRACKETING WIRE;  Surgeon: Robert Bellow, MD;  Location: [facility] ORS;  Service: General;  Laterality: Left;  . BREAST CYST ASPIRATION Bilateral age 76   neg  . CHOLECYSTECTOMY    . COLONOSCOPY WITH PROPOFOL N/A 08/02/2018   Procedure: COLONOSCOPY WITH PROPOFOL;  Surgeon: [name], MD;  Location: [facility]COPY;  Service: Endoscopy;  Laterality: N/A;  . DILATION AND CURETTAGE OF UTERUS    . goiter surgery    . OOPHORECTOMY    . THYROIDECTOMY, PARTIAL      Social History   Socioeconomic History  . Marital status: Divorced    Spouse name: Not on file  . Number of children: Not on file  . Years of education: Not on file  . Highest education level: Not on file  Occupational History  . Not on file  Social Needs  . Financial resource strain: Not on file  . Food insecurity    Worry: Not on file    Inability: Not on file  . Transportation needs    Medical: Not on file    Non-medical: Not on file  Tobacco Use  . Smoking status: Never Smoker  . Smokeless tobacco: Never Used   Substance and Sexual Activity  . Alcohol use: No  . Drug use: No  . Sexual activity: Not Currently  Lifestyle  . Physical activity    Days per week: Not on file    Minutes per session: Not on file  . Stress: Not on file  Relationships  . Social Herbalist on phone: Not on file    Gets together: Not on file    Attends religious service: Not on file    Active member of club or organization: Not on file    Attends meetings of clubs or organizations: Not on file    Relationship status: Not on file  . Intimate partner violence    Fear of current or ex partner: Not on file    Emotionally abused: Not on file    Physically abused: Not on file    Forced sexual activity: Not on file  Other Topics Concern  . Not on file  Social History Narrative  . Not on file    Family History  Problem Relation Age of Onset  . Breast cancer Cousin        mat cousin  . Hypertension Mother   . Heart attack Father   .  Prostate cancer Father   . Diabetes Father   . Colon polyps Father      Current Outpatient Medications:  .  aspirin EC 81 MG tablet, Take 81 mg by mouth daily., Disp: , Rfl:  .  atorvastatin (LIPITOR) 10 MG tablet, Take 10 mg by mouth daily. , Disp: , Rfl:  .  Calcium Carbonate-Vitamin D 600-400 MG-UNIT tablet, Take 1 tablet by mouth daily. , Disp: , Rfl:  .  lisinopril-hydrochlorothiazide (PRINZIDE,ZESTORETIC) 20-12.5 MG tablet, Take 1 tablet by mouth daily., Disp: , Rfl:  .  Multiple Vitamins-Minerals (CENTRUM SILVER PO), Take 1 tablet by mouth daily. , Disp: , Rfl:  .  HYDROcodone-acetaminophen (NORCO/VICODIN) 5-325 MG tablet, Take 1 tablet by mouth every 4 (four) hours as needed for moderate pain. (Patient not taking: Reported on 06/29/2019), Disp: 15 tablet, Rfl: 0  Physical exam: There were no vitals filed for this visit. Physical Exam Constitutional:      General: She is not in acute distress. HENT:     Head: Normocephalic and atraumatic.  Eyes:     Pupils:  Pupils are equal, round, and reactive to light.  Neck:     Musculoskeletal: Normal range of motion.  Cardiovascular:     Rate and Rhythm: Normal rate and regular rhythm.     Heart sounds: Normal heart sounds.  Pulmonary:     Effort: Pulmonary effort is normal.     Breath sounds: Normal breath sounds.  Abdominal:     General: Bowel sounds are normal.     Palpations: Abdomen is soft.  Skin:    General: Skin is warm and dry.  Neurological:     Mental Status: She is alert and oriented to person, place, and time.      CMP Latest Ref Rng & Units 06/28/2019  Glucose 65 - 99 mg/dL -  BUN 6 - 20 mg/dL -  Creatinine 0.44 - 1.00 mg/dL 0.80  Sodium 135 - 145 mmol/L -  Potassium 3.5 - 5.1 mmol/L -  Chloride 101 - 111 mmol/L -  CO2 22 - 32 mmol/L -  Calcium 8.9 - 10.3 mg/dL -   CBC Latest Ref Rng & Units 06/01/2019  WBC 4.0 - 10.5 K/uL 8.0  Hemoglobin 12.0 - 15.0 g/dL 12.9  Hematocrit 36.0 - 46.0 % 37.7  Platelets 150 - 400 K/uL 321    No images are attached to the encounter.  Mr Breast Bilateral W [place] Cad  Result Date: 06/28/2019 CLINICAL DATA:  Recently diagnosed multifocal left breast cancer. Post left breast lumpectomy. LABS:  None. EXAM: BILATERAL BREAST MRI WITH AND WITHOUT CONTRAST TECHNIQUE: Multiplanar, multisequence MR images of both breasts were obtained prior to and following the intravenous administration of 9 ml of Gadavist Three-dimensional MR images were rendered by post-processing of the original MR data on an independent workstation. The three-dimensional MR images were interpreted, and findings are reported in the following complete MRI report for this study. Three dimensional images were evaluated at the independent DynaCad workstation COMPARISON:  Previous exam(s). FINDINGS: Breast composition: b. Scattered fibroglandular tissue. Background parenchymal enhancement: Moderate. Right breast: No mass or abnormal enhancement. Left breast: Post lumpectomy seroma in the  left breast upper outer quadrant with minimal thin peripheral enhancement. There is however clumped discontinuous non mass enhancement in the left breast upper breast, middle and posterior depth, involving the outer and inner quadrants. The abnormality measures approximately 9.8 by 4.2 by 2.1 cm. Lymph nodes: No abnormal appearing lymph nodes. There is 1  prominent in size, measuring 1.5 cm in long-axis, lymph node in the left axilla which however demonstrates benign morphology. Linear and nodular enhancement in the left axilla, surrounding the surgical site from prior relatively recent lymph node dissection. Ancillary findings:  None. IMPRESSION: Large area of clumped discontinues non mass enhancement in the left upper breast, involving the outer and inner quadrants, middle and posterior depth. The abnormality measures up to 9.8 cm in AP dimension and is suspicious for residual DCIS or invasive carcinoma. If breast conservation is considered, MRI guided core needle biopsy of a representative sample of this enhancement, farthest away from the lumpectomy cavity, to determine extent of disease, is recommended. No MRI evidence of right breast malignancy. No evidence of lymphadenopathy. RECOMMENDATION: MRI guided core needle biopsy of the left breast, if left breast conservation is considered. BI-RADS CATEGORY  4: Suspicious. Electronically Signed   By: Fidela Salisbury M.D.   On: 06/28/2019 16:13   Nm Sentinel Node Injection  Result Date: 06/05/2019 CLINICAL DATA:  Left breast cancer. EXAM: NUCLEAR MEDICINE BREAST LYMPHOSCINTIGRAPHY TECHNIQUE: Intradermal injection of radiopharmaceutical was performed at the 12 o'clock, 3 o'clock, 6 o'clock, and 9 o'clock positions around the left nipple. The patient was then sent to the operating room where the sentinel node(s) were identified and removed by the surgeon. RADIOPHARMACEUTICALS:  Total of 1 mCi Millipore-filtered Technetium-56msulfur colloid, injected in four  aliquots of 0.25 mCi each. IMPRESSION: Uncomplicated intradermal injection of a total of 1 mCi Technetium-966mulfur colloid for purposes of sentinel node identification. Electronically Signed   By: HeKathreen Devoid On: 06/05/2019 09:40   Mm Breast Surgical Specimen  Addendum Date: 06/05/2019   ADDENDUM REPORT: 06/05/2019 14:00 ADDENDUM: A second specimen was sent, containing the heart shaped clip. Electronically Signed   By: DaDorise BullionII M.D   On: 06/05/2019 14:00   Result Date: 06/05/2019 CLINICAL DATA:  Evaluate specimen EXAM: SPECIMEN RADIOGRAPH OF THE LEFT BREAST COMPARISON:  Previous exam(s). FINDINGS: Status post excision of the left breast. Both wires are within the specimen. The X shaped, coil shaped, and ribbon shaped clips are within the specimen. The coil shaped clip is on the edge of the specimen. The heart shaped clip is not located within the specimen. IMPRESSION: Specimen radiograph of the left breast. Findings were discussed with the surgeon. Electronically Signed: By: DaDorise BullionII M.D On: 06/05/2019 13:15   Mm Left Plc Breast Loc Dev   1st Lesion  Inc Mammo Guide  Result Date: 06/05/2019 CLINICAL DATA:  Bracketed needle localization EXAM: NEEDLE LOCALIZATION OF THE LEFT BREAST WITH MAMMO GUIDANCE COMPARISON:  Previous exams. FINDINGS: Patient presents for needle localization prior to surgery. I met with the patient and we discussed the procedure of needle localization including benefits and alternatives. We discussed the high likelihood of a successful procedure. We discussed the risks of the procedure, including infection, bleeding, tissue injury, and further surgery. Informed, written consent was given. The usual time-out protocol was performed immediately prior to the procedure. Using mammographic guidance, sterile technique, 1% lidocaine and a 5 cm modified Kopans needle, the anterior coil shaped clip was localized using lateral approach. Using mammographic guidance,  sterile technique, and 1% lidocaine, a 7 cm modified Kopan's needle was used to localize the posterior X shaped clip using a lateral approach. Of note, the heart and ribbon shaped clips are located superior and inferior to the localizing wires respectively. The heart shaped clip is located 1.95 cm superior and 2.6 cm posterior  to the anterior wire. The ribbon shaped clip is located 2.5 cm inferior and 1.3 cm anterior to the posterior wire. The images were marked for the surgeon. IMPRESSION: Needle localization left breast. No apparent complications. Electronically Signed   By: Dorise Bullion III M.D   On: 06/05/2019 08:47   Mm Left Plc Breast Loc Dev   Ea Add Lesion  Inc Mammo Guide  Result Date: 06/05/2019 : Please see accession number [id number] [facility] for the report. Electronically Signed   By: Dorise Bullion III M.D   On: 06/05/2019 09:32     Assessment and plan- Patient is a 68 y.o. female with invasive mammary carcinoma of the left breast pathological prognostic stage I [id number] ER PR positive HER-2/neu negative status post lumpectomy and sentinel lymph node biopsy here to discuss the results of her MRI and MammaPrint testing  We discussed patient's case at tumor board.  At that time there was a concern that there was an extensive invasive mammary carcinoma seen throughout the lumpectomy specimen as well as extensive DCIS associated with that.  There was a concern that there may be more suspicious areas which may not have been picked up by mammogram and MRI was recommended postlumpectomy.  MRI of bilateral breasts shows continued areas of non-mass enhancement in her left breast panning an area of 9.8 cm suspicious for DCIS or invasive mammary carcinoma.  I discussed these findings with her.  I would recommend mastectomy at this time given these abnormal areas versus attempting repeat biopsy of these areas and continued surveillance.  Patient would like to proceed with a mastectomy at this time  and I will get in touch with Dr. Dahlia Byes regarding this.  I also discussed the results of the MammaPrint which came back as high risk and translates to an absolute chemotherapy benefit of greater than 12%.  I would therefore recommended patient should undergo adjuvant chemotherapy soon after her mastectomy.  I would recommend 4 cycles of Taxotere and Cytoxan given every 3 weeks x 4 cycles with growth factor support.  Discussed risks and benefits of chemotherapy including all but not limited to nausea, vomiting, fatigue, low blood counts, risk of infections and hospitalization.  Risk of hair loss and peripheral neuropathy associated with Taxotere.  Treatment will be given with a curative intent.  Patient understands and agrees to proceed as planned.  I will tentatively see the patient in 1 month's time after her mastectomy to start her first cycle of adjuvant chemotherapy.  Patient will need a port placement as well which will be coordinated at the time of mastectomy by Dr. Dahlia Byes.  Patient does not desire reconstruction of her left breast at this time.  Upon completion of adjuvant chemotherapy I will refer her to radiation oncology to see if there would be a role for postmastectomy radiation treatment given that she did have one lymph node that was positive.  She would also be a candidate for hormone therapy down the line upon completion of chemotherapy and radiation treatment which I will discuss in further detail upon completion of chemo.  Patient comprehends my plan well   Total face to face encounter time for this patient visit was 40 min. >50% of the time was  spent in counseling and coordination of care.     Visit Diagnosis 1. Malignant neoplasm of upper-outer quadrant of left breast in female, estrogen receptor positive ([place])   2. Goals of care, counseling/discussion      Dr. Randa Evens, MD, MPH [facility]  at [facility] [phone] 06/30/2019 1:41 PM

## 2019-06-30 NOTE — Telephone Encounter (Signed)
Patient notified 07/05/2019 @ 8:40 appointment.

## 2019-07-05 ENCOUNTER — Telehealth: Payer: Self-pay | Admitting: *Deleted

## 2019-07-05 ENCOUNTER — Ambulatory Visit (INDEPENDENT_AMBULATORY_CARE_PROVIDER_SITE_OTHER): Payer: Medicare Other | Admitting: Surgery

## 2019-07-05 ENCOUNTER — Encounter: Payer: Self-pay | Admitting: Surgery

## 2019-07-05 ENCOUNTER — Other Ambulatory Visit: Payer: Self-pay

## 2019-07-05 ENCOUNTER — Encounter: Payer: Self-pay | Admitting: *Deleted

## 2019-07-05 VITALS — BP 140/87 | HR 93 | Temp 97.5°F | Resp 16 | Ht 61.0 in | Wt 208.8 lb

## 2019-07-05 DIAGNOSIS — C50412 Malignant neoplasm of upper-outer quadrant of left female breast: Secondary | ICD-10-CM

## 2019-07-05 DIAGNOSIS — Z170 Estrogen receptor positive status [ER+]: Secondary | ICD-10-CM

## 2019-07-05 NOTE — Patient Instructions (Signed)
Implanted [facility] Guide An implanted port is a device that is placed under the skin. It is usually placed in the chest. The device can be used to give IV medicine, to take blood, or for dialysis. You may have an implanted port if:  You need IV medicine that would be irritating to the small veins in your hands or arms.  You need IV medicines, such as antibiotics, for a long period of time.  You need IV nutrition for a long period of time.  You need dialysis. Having a port means that your health care provider will not need to use the veins in your arms for these procedures. You may have fewer limitations when using a port than you would if you used other types of long-term IVs, and you will likely be able to return to normal activities after your incision heals. An implanted port has two main parts:  Reservoir. The reservoir is the part where a needle is inserted to give medicines or draw blood. The reservoir is round. After it is placed, it appears as a small, raised area under your skin.  Catheter. The catheter is a thin, flexible tube that connects the reservoir to a vein. Medicine that is inserted into the reservoir goes into the catheter and then into the vein. How is my port accessed? To access your port:  A numbing cream may be placed on the skin over the port site.  Your health care provider will put on a mask and sterile gloves.  The skin over your port will be cleaned carefully with a germ-killing soap and allowed to dry.  Your health care provider will gently pinch the port and insert a needle into it.  Your health care provider will check for a blood return to make sure the port is in the vein and is not clogged.  If your port needs to remain accessed to get medicine continuously (constant infusion), your health care provider will place a clear bandage (dressing) over the needle site. The dressing and needle will need to be changed every week, or as told by your health  care provider. What is flushing? Flushing helps keep the port from getting clogged. Follow instructions from your health care provider about how and when to flush the port. Ports are usually flushed with saline solution or a medicine called heparin. The need for flushing will depend on how the port is used:  If the port is only used from time to time to give medicines or draw blood, the port may need to be flushed: ? Before and after medicines have been given. ? Before and after blood has been drawn. ? As part of routine maintenance. Flushing may be recommended every 4-6 weeks.  If a constant infusion is running, the port may not need to be flushed.  Throw away any syringes in a disposal container that is meant for sharp items (sharps container). You can buy a sharps container from a pharmacy, or you can make one by using an empty hard plastic bottle with a cover. How long will my port stay implanted? The port can stay in for as long as your health care provider thinks it is needed. When it is time for the port to come out, a surgery will be done to remove it. The surgery will be similar to the procedure that was done to put the port in. Follow these instructions at home:   Flush your port as told by your health care  provider.  If you need an infusion over several days, follow instructions from your health care provider about how to take care of your port site. Make sure you: ? Wash your hands with soap and water before you change your dressing. If soap and water are not available, use alcohol-based hand sanitizer. ? Change your dressing as told by your health care provider. ? Place any used dressings or infusion bags into a plastic bag. Throw that bag in the trash. ? Keep the dressing that covers the needle clean and dry. Do not get it wet. ? Do not use scissors or sharp objects near the tube. ? Keep the tube clamped, unless it is being used.  Check your port site every day for signs of  infection. Check for: ? Redness, swelling, or pain. ? Fluid or blood. ? Pus or a bad smell.  Protect the skin around the port site. ? Avoid wearing bra straps that rub or irritate the site. ? Protect the skin around your port from seat belts. Place a soft pad over your chest if needed.  Bathe or shower as told by your health care provider. The site may get wet as long as you are not actively receiving an infusion.  Return to your normal activities as told by your health care provider. Ask your health care provider what activities are safe for you.  Carry a medical alert card or wear a medical alert bracelet at all times. This will let health care providers know that you have an implanted port in case of an emergency. Get help right away if:  You have redness, swelling, or pain at the port site.  You have fluid or blood coming from your port site.  You have pus or a bad smell coming from the port site.  You have a fever. Summary  Implanted ports are usually placed in the chest for long-term IV access.  Follow instructions from your health care provider about flushing the port and changing bandages (dressings).  Take care of the area around your port by avoiding clothing that puts pressure on the area, and by watching for signs of infection.  Protect the skin around your port from seat belts. Place a soft pad over your chest if needed.  Get help right away if you have a fever or you have redness, swelling, pain, drainage, or a bad smell at the port site. This information is not intended to replace advice given to you by your health care provider. Make sure you discuss any questions you have with your health care provider. Document Released: 11/09/2005 Document Revised: 03/03/2019 Document Reviewed: 12/12/2016 Elsevier Patient Education  2020 [employer]. Total or Modified Radical Mastectomy A total mastectomy and a modified radical mastectomy are surgeries that are done as part  of treatment for breast cancer. You will have one of those types of surgery. Both types involve removing a breast.  In a total mastectomy (simple mastectomy), all breast tissue including the nipple will be removed.  In a modified radical mastectomy, lymph nodes under the arm will be removed along with the breast and nipple. Some of the lining over the muscle tissues under the breast may also be removed. These procedures may also be used to help prevent breast cancer. A preventive (prophylactic) mastectomy may be done if you are at an increased risk of breast cancer due to harmful changes (mutations) in certain genes (BRCA genes). In that case, the procedure involves removing both of your breasts. This  can reduce your risk of developing breast cancer in the future. For a transgender person, a total mastectomy may be done as part of a surgical transition from female to female. Let your health care provider know about:  Any allergies you have.  All medicines you are taking, including vitamins, herbs, eye drops, creams, and over-the-counter medicines.  Any problems you or family members have had with anesthetic medicines.  Any blood disorders you have.  Any surgeries you have had.  Any medical conditions you have.  Whether you are pregnant or may be pregnant. What are the risks? Generally, this is a safe procedure. However, problems may occur, including:  Pain.  Infection.  Bleeding.  Allergic reactions to medicines.  Scar tissue.  Chest numbness on the side of the surgery.  Fluid buildup under the skin flaps where your breast was removed (seroma).  Sensation of throbbing or tingling.  Stress or sadness from losing your breast. If you have the lymph nodes under your arm removed, you may have arm swelling, weakness, or numbness on the same side of your body as your surgery. What happens before the procedure? Staying hydrated Follow instructions from your health care provider  about hydration, which may include:  Up to 2 hours before the procedure - you may continue to drink clear liquids, such as water, clear fruit juice, black coffee, and plain tea. Eating and drinking restrictions Follow instructions from your health care provider about eating and drinking, which may include:  8 hours before the procedure - stop eating heavy meals or foods such as meat, fried foods, or fatty foods.  6 hours before the procedure - stop eating light meals or foods, such as toast or cereal.  6 hours before the procedure - stop drinking milk or drinks that contain milk.  2 hours before the procedure - stop drinking clear liquids. Medicines  Ask your health care provider about: ? Changing or stopping your regular medicines. This is especially important if you are taking diabetes medicines or blood thinners. ? Taking medicines such as aspirin and ibuprofen. These medicines can thin your blood. Do not take these medicines unless your health care provider tells you to take them. ? Taking over-the-counter medicines, vitamins, herbs, and supplements.  Your health care team may give you antibiotic medicine to help prevent infection. General instructions  You may be checked for extra fluid around your lymph nodes (lymphedema).  Plan to have someone take you home from the hospital or clinic.  Plan to have a responsible adult care for you for at least 24 hours after you leave the hospital or clinic. This is important.  Ask your health care provider how your surgical site will be marked or identified.  You may be asked to shower with a germ-killing soap. What happens during the procedure?   To lower your risk of infection: ? Your health care team will wash or sanitize their hands. ? Your skin will be washed with soap.  An IV will be inserted into one of your veins.  You will be given a medicine to make you fall asleep (general anesthetic).  A wide incision will be made  around your nipple. The skin and nipple inside the incision will be removed along with all breast tissue.  If you are having a modified radical mastectomy: ? The lining over your chest muscles will be removed. ? The incision may be extended to reach the lymph nodes under your arm, or a second incision may be  made. ? Lymph nodes will be removed.  Breast tissue and lymph nodes that are removed will be sent to the lab for testing.  You may have a drainage tube inserted into your incision to collect fluid that builds up after surgery. This tube will be connected to a suction bulb on the outside of your body to remove the fluid.  Your incision or incisions will be closed with stitches (sutures).  A bandage (dressing) will be placed over your breast area. If lymph nodes were removed, a dressing will also be placed under your arm. The procedure may vary among health care providers and hospitals. What happens after the procedure?  Your blood pressure, heart rate, breathing rate, and blood oxygen level will be monitored until the medicines you were given have worn off.  You will be given pain medicine as needed.  You will be encouraged to get up and walk as soon as you can.  Your IV can be removed when you are able to eat and drink.  You may have a drainage tube in place for 2-3 days to prevent a collection of blood (hematoma) from developing in the breast area. You will be given instructions about caring for the drain before you go home.  A pressure bandage may be applied for 1-2 days to prevent bleeding or swelling. Ask your health care provider how to care for your pressure bandage at home. Summary  In a total mastectomy (simple mastectomy), all breast tissue including the nipple will be removed. In a modified radical mastectomy, the lymph nodes under the arm will be removed along with the breast and nipple.  Before the procedure, follow instructions from your health care provider about  eating and drinking, and ask about changing or stopping your regular medicines.  You will be given a medicine to make you fall asleep (general anesthetic) during the procedure. This information is not intended to replace advice given to you by your health care provider. Make sure you discuss any questions you have with your health care provider. Document Released: 08/04/2001 Document Revised: 01/13/2019 Document Reviewed: 08/13/2017 Elsevier Patient Education  2020 [employer].

## 2019-07-05 NOTE — Progress Notes (Signed)
Outpatient Surgical Follow Up  07/05/2019  Amanda Wade is an 68 y.o. female.   Chief Complaint  Patient presents with  . 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   She did have reexcision during the same operative setting with clear margins.  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 He has been discussed at tumor conference because of extensive DCIS there is concern that she may be harboring some additional DCIS fossae within the same breast. MRI did confirm area suspicious for extensive DCIS within the same breast.  She has also seen Dr. Janese Banks and she is okay with proceeding with mastectomy and port placement.  Past Medical History:  Diagnosis Date  . Complication of anesthesia   . Hyperlipidemia   . Hypertension   . Malignant neoplasm of upper-outer quadrant of left breast in female, estrogen receptor positive ([place]) 05/19/2019  . PONV (postoperative nausea and vomiting)   . Vertigo    intermittent especially lying on left side    Past Surgical History:  Procedure Laterality Date  . ABDOMINAL HYSTERECTOMY  2004   complete  . BREAST BIOPSY Left 05/05/2019   [facility]- ribbon clip  . BREAST BIOPSY Left 05/16/2019   us-DCIS  . BREAST BIOPSY Left 05/16/2019   [facility]- X clip  . BREAST BIOPSY WITH SENTINEL LYMPH NODE BIOPSY AND NEEDLE LOCALIZATION Left 06/05/2019   Procedure: BREAST BIOPSY WITH SENTINEL LYMPH NODE BIOPSY AND NEEDLE LOCALIZATION, LEFT, BRACKETING WIRE;  Surgeon: Robert Bellow, MD;  Location: [facility] ORS;  Service: General;  Laterality: Left;  . BREAST CYST ASPIRATION Bilateral age 56   neg  . CHOLECYSTECTOMY    . COLONOSCOPY WITH PROPOFOL N/A 08/02/2018   Procedure: COLONOSCOPY WITH PROPOFOL;  Surgeon: [name], MD;  Location: [facility]  ENDOSCOPY;  Service: Endoscopy;  Laterality: N/A;  . DILATION AND CURETTAGE OF UTERUS    . goiter surgery    . OOPHORECTOMY    . THYROIDECTOMY, PARTIAL      Family History  Problem Relation Age of Onset  . Breast cancer Cousin        mat cousin  . Hypertension Mother   . Heart attack Father   . Prostate cancer Father   . Diabetes Father   . Colon polyps Father     Social History:  reports that she has never smoked. She has never used smokeless tobacco. She reports that she does not drink alcohol or use drugs.  Allergies:  Allergies  Allergen Reactions  . Shellfish Allergy Rash  . Sulfa Antibiotics Rash    Medications reviewed.    ROS Full ROS performed and is otherwise negative other than what is stated in HPI   BP 140/87   Pulse 93   Temp (!) 97.5 F (36.4 C) (Temporal)   Resp 16   Ht 5' 1"  (1.549 m)   Wt 208 lb 12.8 oz (94.7 kg)   SpO2 94%   BMI 39.45 kg/m   Physical Exam Vitals signs and nursing note reviewed. Exam conducted with a chaperone present.  Constitutional:      Appearance: Normal appearance. She is normal weight.  Eyes:     General:        Right eye: No discharge.  Left eye: No discharge.  Neck:     Musculoskeletal: Normal range of motion and neck supple. No neck rigidity.  Cardiovascular:     Rate and Rhythm: Normal rate and regular rhythm.     Heart sounds: No murmur.  Pulmonary:     Effort: Pulmonary effort is normal. No respiratory distress.     Breath sounds: Normal breath sounds. No stridor.     Comments: Left lumpectomy scar and SLN bx healing well, no infection Abdominal:     General: Abdomen is flat. There is no distension.     Palpations: There is no mass.     Tenderness: There is no abdominal tenderness. There is no guarding.  Skin:    General: Skin is warm and dry.     Capillary Refill: Capillary refill takes less than 2 seconds.  Neurological:     General: No focal deficit present.     Mental Status: She is alert  and oriented to person, place, and time.  Psychiatric:        Mood and Affect: Mood normal.        Behavior: Behavior normal.        Thought Content: Thought content normal.        Judgment: Judgment normal.       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  Given the extension of the DCIS the options were discussed with the patient either MRI guided biopsy versus mastectomy.  She wishes to go ahead and do a mastectomy.  Procedure discussed with the patient in detail.  Risk, benefits and possible complications including but not limited to: Bleeding, infection, seroma, skin flaps necrosis, bleeding and also issues with vascular access to include pneumothorax and vascular injuries.  She understands and wishes to proceed Plan for left simple mastectomy and port palcement Greater than 50% of the 40 minutes  visit was spent in counseling/coordination of care   Caroleen Hamman, MD [place] Surgeon

## 2019-07-05 NOTE — Progress Notes (Addendum)
Patient's surgery to be scheduled for 07-10-19 at [facility] with Dr. Dahlia Byes. Dr. Genevive Bi to assist if available depending on timing (if not, ok per Dr. Dahlia Byes).   The patient is aware to have COVID-19 testing done on 07-06-19 at the [place] building drive thru ([address] [place]). We will try and coordinate with Pre-admission appointment. She is aware to isolate after, have no visitors, wash hands frequently, and avoid touching face.   The patient is aware she will need to Pre-Admit. Patient will check in at the [place] entrance due to COVID-19 restrictions and will then be escorted to the [place] [facility], [address] (first floor). Patient will be contacted once Pre-admission appointment has been arranged with date and time.   Patient aware to be NPO after midnight and have a driver.   She is aware to check in at the [place] entrance where he/she will be screened for the coronavirus and then sent to Same Day Surgery.   Patient aware that she may have one visitor due to COVID-19 restrictions.   The patient verbalizes understanding of the above.   The patient is aware to call the office should she have further questions.   *Home health will be needed for JP care after surgery. Per Corene Cornea with [facility], they can accept the patient. Dr. Dahlia Byes will need to add the consult to CM and add the [facility] orders at discharge and add in the comments to please use [facility]. Note routed to Dr. Dahlia Byes.

## 2019-07-05 NOTE — H&P (View-Only) (Signed)
Outpatient Surgical Follow Up  07/05/2019  Amanda Wade is an 68 y.o. female.   Chief Complaint  Patient presents with  . 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   She did have reexcision during the same operative setting with clear margins.  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 He has been discussed at tumor conference because of extensive DCIS there is concern that she may be harboring some additional DCIS fossae within the same breast. MRI did confirm area suspicious for extensive DCIS within the same breast.  She has also seen Dr. Janese Banks and she is okay with proceeding with mastectomy and port placement.  Past Medical History:  Diagnosis Date  . Complication of anesthesia   . Hyperlipidemia   . Hypertension   . Malignant neoplasm of upper-outer quadrant of left breast in female, estrogen receptor positive ([place]) 05/19/2019  . PONV (postoperative nausea and vomiting)   . Vertigo    intermittent especially lying on left side    Past Surgical History:  Procedure Laterality Date  . ABDOMINAL HYSTERECTOMY  2004   complete  . BREAST BIOPSY Left 05/05/2019   [facility]- ribbon clip  . BREAST BIOPSY Left 05/16/2019   us-DCIS  . BREAST BIOPSY Left 05/16/2019   [facility]- X clip  . BREAST BIOPSY WITH SENTINEL LYMPH NODE BIOPSY AND NEEDLE LOCALIZATION Left 06/05/2019   Procedure: BREAST BIOPSY WITH SENTINEL LYMPH NODE BIOPSY AND NEEDLE LOCALIZATION, LEFT, BRACKETING WIRE;  Surgeon: Robert Bellow, MD;  Location: [facility] ORS;  Service: General;  Laterality: Left;  . BREAST CYST ASPIRATION Bilateral age 65   neg  . CHOLECYSTECTOMY    . COLONOSCOPY WITH PROPOFOL N/A 08/02/2018   Procedure: COLONOSCOPY WITH PROPOFOL;  Surgeon: [name], MD;  Location: [facility]  ENDOSCOPY;  Service: Endoscopy;  Laterality: N/A;  . DILATION AND CURETTAGE OF UTERUS    . goiter surgery    . OOPHORECTOMY    . THYROIDECTOMY, PARTIAL      Family History  Problem Relation Age of Onset  . Breast cancer Cousin        mat cousin  . Hypertension Mother   . Heart attack Father   . Prostate cancer Father   . Diabetes Father   . Colon polyps Father     Social History:  reports that she has never smoked. She has never used smokeless tobacco. She reports that she does not drink alcohol or use drugs.  Allergies:  Allergies  Allergen Reactions  . Shellfish Allergy Rash  . Sulfa Antibiotics Rash    Medications reviewed.    ROS Full ROS performed and is otherwise negative other than what is stated in HPI   BP 140/87   Pulse 93   Temp (!) 97.5 F (36.4 C) (Temporal)   Resp 16   Ht 5' 1"  (1.549 m)   Wt 208 lb 12.8 oz (94.7 kg)   SpO2 94%   BMI 39.45 kg/m   Physical Exam Vitals signs and nursing note reviewed. Exam conducted with a chaperone present.  Constitutional:      Appearance: Normal appearance. She is normal weight.  Eyes:     General:        Right eye: No discharge.  Left eye: No discharge.  Neck:     Musculoskeletal: Normal range of motion and neck supple. No neck rigidity.  Cardiovascular:     Rate and Rhythm: Normal rate and regular rhythm.     Heart sounds: No murmur.  Pulmonary:     Effort: Pulmonary effort is normal. No respiratory distress.     Breath sounds: Normal breath sounds. No stridor.     Comments: Left lumpectomy scar and SLN bx healing well, no infection Abdominal:     General: Abdomen is flat. There is no distension.     Palpations: There is no mass.     Tenderness: There is no abdominal tenderness. There is no guarding.  Skin:    General: Skin is warm and dry.     Capillary Refill: Capillary refill takes less than 2 seconds.  Neurological:     General: No focal deficit present.     Mental Status: She is alert  and oriented to person, place, and time.  Psychiatric:        Mood and Affect: Mood normal.        Behavior: Behavior normal.        Thought Content: Thought content normal.        Judgment: Judgment normal.       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  Given the extension of the DCIS the options were discussed with the patient either MRI guided biopsy versus mastectomy.  She wishes to go ahead and do a mastectomy.  Procedure discussed with the patient in detail.  Risk, benefits and possible complications including but not limited to: Bleeding, infection, seroma, skin flaps necrosis, bleeding and also issues with vascular access to include pneumothorax and vascular injuries.  She understands and wishes to proceed Plan for left simple mastectomy and port palcement Greater than 50% of the 40 minutes  visit was spent in counseling/coordination of care   Caroleen Hamman, MD [place] Surgeon

## 2019-07-05 NOTE — Telephone Encounter (Signed)
Patient contacted this afternoon and notified that she needs to go in for her Pre-admission appointment and COVID testing on 07-06-19 at 1:30 pm. The patient is aware of date, time, and instructions. She verbalizes understanding.

## 2019-07-06 ENCOUNTER — Other Ambulatory Visit: Payer: Self-pay

## 2019-07-06 ENCOUNTER — Ambulatory Visit: Payer: Medicare Other

## 2019-07-06 ENCOUNTER — Encounter
Admission: RE | Admit: 2019-07-06 | Discharge: 2019-07-06 | Disposition: A | Discharge status: Home / Self Care | Payer: Medicare Other | Source: Ambulatory Visit | Attending: Surgery | Admitting: Surgery

## 2019-07-06 DIAGNOSIS — Z20828 Contact with and (suspected) exposure to other viral communicable diseases: Secondary | ICD-10-CM | POA: Diagnosis not present

## 2019-07-06 DIAGNOSIS — Z01812 Encounter for preprocedural laboratory examination: Secondary | ICD-10-CM | POA: Insufficient documentation

## 2019-07-06 LAB — POTASSIUM: Potassium: 3.5 mmol/L (ref 3.5–5.1)

## 2019-07-06 NOTE — Patient Instructions (Signed)
Your procedure is scheduled on:07/10/19 Report to Day Surgery.[facility]OND FLOOR To find out your arrival time please call [phone] between 1PM - 3PM on 07/07/19.  Remember: Instructions that are not followed completely may result in serious medical risk,  up to and including death, or upon the discretion of your surgeon and anesthesiologist your  surgery may need to be rescheduled.     _X__ 1. Do not eat food after midnight the night before your procedure.                 No gum chewing or hard candies. You may drink clear liquids up to 2 hours                 before you are scheduled to arrive for your surgery- DO not drink clear                 liquids within 2 hours of the start of your surgery.                 Clear Liquids include:  water, apple juice without pulp, clear carbohydrate                 drink such as Clearfast of Gatorade, Black Coffee or Tea (Do not add                 anything to coffee or tea).  __X__2.  On the morning of surgery brush your teeth with toothpaste and water, you                may rinse your mouth with mouthwash if you wish.  Do not swallow any toothpaste of mouthwash.     _X__ 3.  No Alcohol for 24 hours before or after surgery.   _X__ 4.  Do Not Smoke or use e-cigarettes For 24 Hours Prior to Your Surgery.                 Do not use any chewable tobacco products for at least 6 hours prior to                 surgery.  ____  5.  Bring all medications with you on the day of surgery if instructed.   _X___  6.  Notify your doctor if there is any change in your medical condition      (cold, fever, infections).     Do not wear jewelry, make-up, hairpins, clips or nail polish. Do not wear lotions, powders, or perfumes. You may wear deodorant. Do not shave 48 hours prior to surgery. Men may shave face and neck. Do not bring valuables to the hospital.    [facility] is not responsible for any belongings or  valuables.  Contacts, dentures or bridgework may not be worn into surgery. Leave your suitcase in the car. After surgery it may be brought to your room. For patients admitted to the hospital, discharge time is determined by your treatment team.   Patients discharged the day of surgery will not be allowed to drive home.   Please read over the following fact sheets that you were given:   Surgical Site Infection Prevention          ____ Take these medicines the morning of surgery with A SIP OF WATER:    1. ATORVASTAIN  2.   3.   4.  5.  6.  ____ Fleet Enema (as directed)   _X___ Use  CHG Soap as directed  ____ Use inhalers on the day of surgery  ____ Stop metformin 2 days prior to surgery    ____ Take 1/2 of usual insulin dose the night before surgery. No insulin the morning          of surgery.   _X___ Stop Coumadin/Plavix/aspirin on  STOP ASPIRIN 07/06/19  ____ Stop Anti-inflammatories on    ____ Stop supplements until after surgery.    ____ Bring C-Pap to the hospital.

## 2019-07-07 LAB — SARS CORONAVIRUS 2 (TAT 6-24 HRS): SARS Coronavirus 2: NEGATIVE

## 2019-07-09 MED ORDER — CEFAZOLIN SODIUM-DEXTROSE 2-4 GM/100ML-% IV SOLN
2.0000 g | INTRAVENOUS | Status: AC
  Administered 2019-07-10: 2 g via INTRAVENOUS

## 2019-07-10 ENCOUNTER — Ambulatory Visit
Admission: RE | Admit: 2019-07-10 | Discharge: 2019-07-10 | Disposition: A | Discharge status: Home / Self Care | Payer: Medicare Other | Source: Home / Self Care | Attending: Surgery | Admitting: Surgery

## 2019-07-10 ENCOUNTER — Encounter: Payer: Self-pay | Admitting: *Deleted

## 2019-07-10 ENCOUNTER — Ambulatory Visit: Payer: Medicare Other | Admitting: Anesthesiology

## 2019-07-10 ENCOUNTER — Other Ambulatory Visit: Payer: Self-pay

## 2019-07-10 ENCOUNTER — Ambulatory Visit: Payer: Medicare Other

## 2019-07-10 ENCOUNTER — Ambulatory Visit
Admission: RE | Admit: 2019-07-10 | Discharge: 2019-07-10 | Disposition: A | Discharge status: Home / Self Care | Payer: Medicare Other | Attending: Surgery | Admitting: Surgery

## 2019-07-10 ENCOUNTER — Encounter
Admission: RE | Disposition: A | Discharge status: Home / Self Care | Payer: Self-pay | Source: Home / Self Care | Attending: Surgery

## 2019-07-10 DIAGNOSIS — Z90710 Acquired absence of both cervix and uterus: Secondary | ICD-10-CM | POA: Insufficient documentation

## 2019-07-10 DIAGNOSIS — Z8042 Family history of malignant neoplasm of prostate: Secondary | ICD-10-CM | POA: Diagnosis not present

## 2019-07-10 DIAGNOSIS — C50412 Malignant neoplasm of upper-outer quadrant of left female breast: Secondary | ICD-10-CM

## 2019-07-10 DIAGNOSIS — Z833 Family history of diabetes mellitus: Secondary | ICD-10-CM | POA: Diagnosis not present

## 2019-07-10 DIAGNOSIS — E785 Hyperlipidemia, unspecified: Secondary | ICD-10-CM | POA: Insufficient documentation

## 2019-07-10 DIAGNOSIS — Z8371 Family history of colonic polyps: Secondary | ICD-10-CM | POA: Diagnosis not present

## 2019-07-10 DIAGNOSIS — Z91013 Allergy to seafood: Secondary | ICD-10-CM | POA: Insufficient documentation

## 2019-07-10 DIAGNOSIS — Z6839 Body mass index (BMI) 39.0-39.9, adult: Secondary | ICD-10-CM | POA: Diagnosis not present

## 2019-07-10 DIAGNOSIS — Z803 Family history of malignant neoplasm of breast: Secondary | ICD-10-CM | POA: Insufficient documentation

## 2019-07-10 DIAGNOSIS — D0512 Intraductal carcinoma in situ of left breast: Secondary | ICD-10-CM | POA: Diagnosis not present

## 2019-07-10 DIAGNOSIS — Z9049 Acquired absence of other specified parts of digestive tract: Secondary | ICD-10-CM | POA: Diagnosis not present

## 2019-07-10 DIAGNOSIS — Z8249 Family history of ischemic heart disease and other diseases of the circulatory system: Secondary | ICD-10-CM | POA: Diagnosis not present

## 2019-07-10 DIAGNOSIS — I10 Essential (primary) hypertension: Secondary | ICD-10-CM | POA: Diagnosis not present

## 2019-07-10 DIAGNOSIS — Z170 Estrogen receptor positive status [ER+]: Secondary | ICD-10-CM | POA: Insufficient documentation

## 2019-07-10 DIAGNOSIS — C50912 Malignant neoplasm of unspecified site of left female breast: Secondary | ICD-10-CM | POA: Diagnosis present

## 2019-07-10 DIAGNOSIS — R0989 Other specified symptoms and signs involving the circulatory and respiratory systems: Secondary | ICD-10-CM | POA: Insufficient documentation

## 2019-07-10 DIAGNOSIS — E890 Postprocedural hypothyroidism: Secondary | ICD-10-CM | POA: Diagnosis not present

## 2019-07-10 DIAGNOSIS — R42 Dizziness and giddiness: Secondary | ICD-10-CM | POA: Diagnosis not present

## 2019-07-10 DIAGNOSIS — Z882 Allergy status to sulfonamides status: Secondary | ICD-10-CM | POA: Diagnosis not present

## 2019-07-10 HISTORY — PX: SIMPLE MASTECTOMY WITH AXILLARY SENTINEL NODE BIOPSY: SHX6098

## 2019-07-10 HISTORY — PX: PORTACATH PLACEMENT: SHX2246

## 2019-07-10 HISTORY — PX: MASTECTOMY: SHX3

## 2019-07-10 SURGERY — SIMPLE MASTECTOMY
Anesthesia: General | Laterality: Left

## 2019-07-10 MED ORDER — LIDOCAINE HCL 1 % IJ SOLN
INTRAMUSCULAR | Status: DC | PRN
  Administered 2019-07-10: 17 mL via SUBCUTANEOUS

## 2019-07-10 MED ORDER — FENTANYL CITRATE (PF) 100 MCG/2ML IJ SOLN
INTRAMUSCULAR | Status: AC
  Filled 2019-07-10: qty 2

## 2019-07-10 MED ORDER — CHLORHEXIDINE GLUCONATE CLOTH 2 % EX PADS: 6.0000 | MEDICATED_PAD | Freq: Once | CUTANEOUS | Status: DC

## 2019-07-10 MED ORDER — LIDOCAINE HCL (PF) 1 % IJ SOLN
INTRAMUSCULAR | Status: AC
  Filled 2019-07-10: qty 30

## 2019-07-10 MED ORDER — CEFAZOLIN SODIUM-DEXTROSE 2-4 GM/100ML-% IV SOLN
INTRAVENOUS | Status: AC
  Filled 2019-07-10: qty 100

## 2019-07-10 MED ORDER — BUPIVACAINE-EPINEPHRINE (PF) 0.25% -1:200000 IJ SOLN
INTRAMUSCULAR | Status: AC
  Filled 2019-07-10: qty 30

## 2019-07-10 MED ORDER — ROCURONIUM BROMIDE 50 MG/5ML IV SOLN
INTRAVENOUS | Status: AC
  Filled 2019-07-10: qty 1

## 2019-07-10 MED ORDER — ACETAMINOPHEN 10 MG/ML IV SOLN
INTRAVENOUS | Status: AC
  Filled 2019-07-10: qty 100

## 2019-07-10 MED ORDER — SODIUM CHLORIDE 0.9 % IV SOLN
INTRAVENOUS | Status: DC | PRN
  Administered 2019-07-10: 11:00:00 1 mL via INTRAMUSCULAR

## 2019-07-10 MED ORDER — PROPOFOL 10 MG/ML IV BOLUS
INTRAVENOUS | Status: AC
  Filled 2019-07-10: qty 20

## 2019-07-10 MED ORDER — FENTANYL CITRATE (PF) 100 MCG/2ML IJ SOLN
INTRAMUSCULAR | Status: AC
  Filled 2019-07-10: qty 4

## 2019-07-10 MED ORDER — ACETAMINOPHEN 10 MG/ML IV SOLN
INTRAVENOUS | Status: DC | PRN
  Administered 2019-07-10: 1000 mg via INTRAVENOUS

## 2019-07-10 MED ORDER — HYDROCODONE-ACETAMINOPHEN 5-325 MG PO TABS: 1.0000 | ORAL_TABLET | ORAL | 0 refills | Status: DC | PRN

## 2019-07-10 MED ORDER — PROPOFOL 10 MG/ML IV BOLUS
INTRAVENOUS | Status: DC | PRN
  Administered 2019-07-10: 140 mg via INTRAVENOUS

## 2019-07-10 MED ORDER — EPHEDRINE SULFATE 50 MG/ML IJ SOLN
INTRAMUSCULAR | Status: DC | PRN
  Administered 2019-07-10: 10 mg via INTRAVENOUS

## 2019-07-10 MED ORDER — FENTANYL CITRATE (PF) 100 MCG/2ML IJ SOLN
INTRAMUSCULAR | Status: DC | PRN
  Administered 2019-07-10 (×4): 50 ug via INTRAVENOUS

## 2019-07-10 MED ORDER — LIDOCAINE HCL (PF) 2 % IJ SOLN
INTRAMUSCULAR | Status: AC
  Filled 2019-07-10: qty 10

## 2019-07-10 MED ORDER — SUGAMMADEX SODIUM 500 MG/5ML IV SOLN
INTRAVENOUS | Status: DC | PRN
  Administered 2019-07-10: 400 mg via INTRAVENOUS

## 2019-07-10 MED ORDER — MIDAZOLAM HCL 2 MG/2ML IJ SOLN
INTRAMUSCULAR | Status: AC
  Filled 2019-07-10: qty 2

## 2019-07-10 MED ORDER — ONDANSETRON HCL 4 MG/2ML IJ SOLN
INTRAMUSCULAR | Status: DC | PRN
  Administered 2019-07-10: 4 mg via INTRAVENOUS

## 2019-07-10 MED ORDER — FAMOTIDINE 20 MG PO TABS
ORAL_TABLET | ORAL | Status: AC
  Filled 2019-07-10: qty 1

## 2019-07-10 MED ORDER — LACTATED RINGERS IV SOLN
INTRAVENOUS | Status: DC
  Administered 2019-07-10 (×2): via INTRAVENOUS

## 2019-07-10 MED ORDER — PHENYLEPHRINE HCL (PRESSORS) 10 MG/ML IV SOLN
INTRAVENOUS | Status: DC | PRN
  Administered 2019-07-10 (×3): 100 ug via INTRAVENOUS

## 2019-07-10 MED ORDER — FENTANYL CITRATE (PF) 100 MCG/2ML IJ SOLN
25.0000 ug | INTRAMUSCULAR | Status: DC | PRN
  Administered 2019-07-10: 25 ug via INTRAVENOUS

## 2019-07-10 MED ORDER — FAMOTIDINE 20 MG PO TABS
20.0000 mg | ORAL_TABLET | Freq: Once | ORAL | Status: AC
  Administered 2019-07-10: 20 mg via ORAL

## 2019-07-10 MED ORDER — LIDOCAINE HCL (CARDIAC) PF 100 MG/5ML IV SOSY
PREFILLED_SYRINGE | INTRAVENOUS | Status: DC | PRN
  Administered 2019-07-10: 80 mg via INTRAVENOUS

## 2019-07-10 MED ORDER — SUCCINYLCHOLINE CHLORIDE 20 MG/ML IJ SOLN
INTRAMUSCULAR | Status: AC
  Filled 2019-07-10: qty 1

## 2019-07-10 MED ORDER — GLYCOPYRROLATE 0.2 MG/ML IJ SOLN
INTRAMUSCULAR | Status: DC | PRN
  Administered 2019-07-10: 0.2 mg via INTRAVENOUS

## 2019-07-10 MED ORDER — HEPARIN SODIUM (PORCINE) 5000 UNIT/ML IJ SOLN
INTRAMUSCULAR | Status: AC
  Filled 2019-07-10: qty 1

## 2019-07-10 MED ORDER — OXYCODONE HCL 5 MG PO TABS: 5.0000 mg | ORAL_TABLET | Freq: Once | ORAL | Status: DC | PRN

## 2019-07-10 MED ORDER — ROCURONIUM BROMIDE 100 MG/10ML IV SOLN
INTRAVENOUS | Status: DC | PRN
  Administered 2019-07-10: 10 mg via INTRAVENOUS
  Administered 2019-07-10: 50 mg via INTRAVENOUS
  Administered 2019-07-10 (×2): 10 mg via INTRAVENOUS

## 2019-07-10 MED ORDER — MIDAZOLAM HCL 2 MG/2ML IJ SOLN
INTRAMUSCULAR | Status: DC | PRN
  Administered 2019-07-10: 2 mg via INTRAVENOUS

## 2019-07-10 MED ORDER — DEXAMETHASONE SODIUM PHOSPHATE 10 MG/ML IJ SOLN
INTRAMUSCULAR | Status: DC | PRN
  Administered 2019-07-10: 10 mg via INTRAVENOUS

## 2019-07-10 MED ORDER — OXYCODONE HCL 5 MG/5ML PO SOLN: 5.0000 mg | Freq: Once | ORAL | Status: DC | PRN

## 2019-07-10 SURGICAL SUPPLY — 68 items
APPLIER CLIP 9.375 SM OPEN (CLIP) ×3
BAG DECANTER FOR FLEXI CONT (MISCELLANEOUS) ×3 IMPLANT
BASIN GRAD PLASTIC 32OZ STRL (MISCELLANEOUS) ×2 IMPLANT
BINDER BREAST XLRG (GAUZE/BANDAGES/DRESSINGS) IMPLANT
BINDER BREAST XXLRG (GAUZE/BANDAGES/DRESSINGS) ×2 IMPLANT
BLADE SURG 15 STRL LF DISP TIS (BLADE) ×1 IMPLANT
BLADE SURG 15 STRL SS (BLADE) ×2
BLADE SURG SZ11 CARB STEEL (BLADE) ×3 IMPLANT
BOOT SUTURE AID YELLOW STND (SUTURE) ×3 IMPLANT
BULB RESERV EVAC DRAIN JP 100C (MISCELLANEOUS) ×5 IMPLANT
CANISTER SUCT 1200ML W/VALVE (MISCELLANEOUS) ×3 IMPLANT
CHLORAPREP W/TINT 26 (MISCELLANEOUS) ×5 IMPLANT
CLIP APPLIE 9.375 SM OPEN (CLIP) ×1 IMPLANT
CNTNR SPEC 2.5X3XGRAD LEK (MISCELLANEOUS) ×1
CONT SPEC 4OZ STER OR WHT (MISCELLANEOUS) ×2
CONTAINER SPEC 2.5X3XGRAD LEK (MISCELLANEOUS) ×1 IMPLANT
COVER LIGHT HANDLE STERIS (MISCELLANEOUS) ×6 IMPLANT
COVER WAND RF STERILE (DRAPES) ×3 IMPLANT
DERMABOND ADVANCED (GAUZE/BANDAGES/DRESSINGS) ×4
DERMABOND ADVANCED .7 DNX12 (GAUZE/BANDAGES/DRESSINGS) ×2 IMPLANT
DEVICE DUBIN SPECIMEN MAMMOGRA (MISCELLANEOUS) ×2 IMPLANT
DRAIN CHANNEL JP 15F RND 16 (MISCELLANEOUS) ×3 IMPLANT
DRAIN CHANNEL JP 19F (MISCELLANEOUS) ×2 IMPLANT
DRAPE 3/4 80X56 (DRAPES) ×3 IMPLANT
DRAPE C-ARM XRAY 36X54 (DRAPES) ×6 IMPLANT
DRAPE INCISE IOBAN 66X45 STRL (DRAPES) ×3 IMPLANT
DRAPE LAPAROTOMY TRNSV 106X77 (MISCELLANEOUS) ×3 IMPLANT
DRSG GAUZE FLUFF 36X18 (GAUZE/BANDAGES/DRESSINGS) ×2 IMPLANT
ELECT CAUTERY BLADE 6.4 (BLADE) ×3 IMPLANT
ELECT REM PT RETURN 9FT ADLT (ELECTROSURGICAL) ×3
ELECTRODE REM PT RTRN 9FT ADLT (ELECTROSURGICAL) ×1 IMPLANT
GEL ULTRASOUND 20GR AQUASONIC (MISCELLANEOUS) ×3 IMPLANT
GLOVE BIO SURGEON STRL SZ7 (GLOVE) ×3 IMPLANT
GOWN STRL REUS W/ TWL LRG LVL3 (GOWN DISPOSABLE) ×2 IMPLANT
GOWN STRL REUS W/TWL LRG LVL3 (GOWN DISPOSABLE) ×4
IV NS 500ML (IV SOLUTION) ×2
IV NS 500ML BAXH (IV SOLUTION) ×1 IMPLANT
KIT PORT POWER 8FR ISP CVUE (Port) ×3 IMPLANT
KIT TURNOVER KIT A (KITS) ×3 IMPLANT
LABEL OR SOLS (LABEL) ×3 IMPLANT
NDL HYPO 27GX1-1/4 (NEEDLE) ×1 IMPLANT
NDL SAFETY ECLIPSE 18X1.5 (NEEDLE) ×1 IMPLANT
NEEDLE HYPO 18GX1.5 SHARP (NEEDLE) ×2
NEEDLE HYPO 22GX1.5 SAFETY (NEEDLE) ×3 IMPLANT
NEEDLE HYPO 27GX1-1/4 (NEEDLE) ×3 IMPLANT
NS IRRIG 1000ML POUR BTL (IV SOLUTION) ×3 IMPLANT
PACK BASIN MINOR ARMC (MISCELLANEOUS) ×3 IMPLANT
PACK PORT-A-CATH (MISCELLANEOUS) ×3 IMPLANT
SLEVE PROBE SENORX GAMMA FIND (MISCELLANEOUS) ×3 IMPLANT
SPONGE LAP 18X18 RF (DISPOSABLE) ×7 IMPLANT
SUT ETH BLK MONO 3 0 FS 1 12/B (SUTURE) ×5 IMPLANT
SUT MNCRL 4-0 (SUTURE) ×6
SUT MNCRL 4-0 27XMFL (SUTURE) ×3
SUT MNCRL AB 4-0 PS2 18 (SUTURE) ×3 IMPLANT
SUT PROLENE 2-0 (SUTURE) ×2
SUT PROLENE 2-0 RB1 36X2 ARM (SUTURE) ×1
SUT VIC AB 2-0 CT1 (SUTURE) ×12 IMPLANT
SUT VIC AB 3-0 SH 27 (SUTURE) ×8
SUT VIC AB 3-0 SH 27X BRD (SUTURE) ×1 IMPLANT
SUTURE MNCRL 4-0 27XMF (SUTURE) ×2 IMPLANT
SUTURE PROLEN 2-0 RB1 36X2 ARM (SUTURE) ×1 IMPLANT
SYR 10ML LL (SYRINGE) ×3 IMPLANT
SYR 20ML LL LF (SYRINGE) ×3 IMPLANT
SYR 3ML LL SCALE MARK (SYRINGE) ×3 IMPLANT
SYR 5ML LL (SYRINGE) ×3 IMPLANT
SYR BULB IRRIG 60ML STRL (SYRINGE) ×2 IMPLANT
TOWEL OR 17X26 4PK STRL BLUE (TOWEL DISPOSABLE) ×3 IMPLANT
WATER STERILE IRR 1000ML POUR (IV SOLUTION) ×1 IMPLANT

## 2019-07-10 NOTE — Interval H&P Note (Signed)
History and Physical Interval Note:  07/10/2019 9:09 AM  Amanda Wade  has presented today for surgery, with the diagnosis of BREAST CANCER.  The various methods of treatment have been discussed with the patient and family. After consideration of risks, benefits and other options for treatment, the patient has consented to  Procedure(s): SIMPLE MASTECTOMY.; LEFT (Left) INSERTION PORT-A-CATH (Left) as a surgical intervention.  The patient's history has been reviewed, patient examined, no change in status, stable for surgery.  I have reviewed the patient's chart and labs.  Questions were answered to the patient's satisfaction.     [place]

## 2019-07-10 NOTE — Discharge Instructions (Signed)
AMBULATORY SURGERY  DISCHARGE INSTRUCTIONS   1) The drugs that you were given will stay in your system until tomorrow so for the next 24 hours you should not:  A) Drive an automobile B) Make any legal decisions C) Drink any alcoholic beverage   2) You may resume regular meals tomorrow.  Today it is better to start with liquids and gradually work up to solid foods.  You may eat anything you prefer, but it is better to start with liquids, then soup and crackers, and gradually work up to solid foods.   3) Please notify your doctor immediately if you have any unusual bleeding, trouble breathing, redness and pain at the surgery site, drainage, fever, or pain not relieved by medication.    4) Additional Instructions:        Please contact your physician with any problems or Same Day Surgery at [phone], Monday through Friday 6 am to 4 pm, or [place] at [facility] number at [phone].       JP Drain [employer] this sheet to all of your post-operative appointments while you have your drains.  Please measure your drains by CC's or ML's.  Make sure you drain and measure your JP Drains 3 times per day. At the end of each day, add up totals for the left side and add up totals for the right side.         ( 9 am )     ( 3 pm )        ( 9 pm )                Date L  R  L  R  L  R  Total L/R

## 2019-07-10 NOTE — Op Note (Signed)
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 Genevive Bi ( required for exposure and complexity of the case)  Anesthesia: IV sedation, marcaine .25% w epi and lidocaine 1%  Procedure: 1. Right IJ  Port placement with fluoroscopy under U/S guidance 2. Left simple mastectomy  Findings: Good position of the tip of the catheter by fluoroscopy  Estimated Blood Loss: 20cc         Drains: 19 Fr chest wall         Specimens: None       Complications: none            Procedure Details  The patient was seen again in the Holding Room. The benefits, complications, treatment options, and expected outcomes were discussed with the patient. The risks of bleeding, infection, recurrence of symptoms, failure to resolve symptoms,  thrombosis nonfunction breakage pneumothorax hemopneumothorax any of which could require chest tube or further surgery were reviewed with the patient.   The patient was taken to Operating Room, identified as Amanda Wade and the procedure verified.  A Time Out was held and the above information confirmed.  Prior to the induction of general anesthesia, antibiotic prophylaxis was administered. VTE prophylaxis was in place. Appropriate anesthesia was then administered and tolerated well. The chest was prepped with Chloraprep and draped in the sterile fashion. The patient was positioned in the supine position. Then the patient was placed in Trendelenburg position.  Patient was prepped and draped in sterile fashion and in a Trendelenburg position local anesthetic was infiltrated into the skin and subcutaneous tissues in the neck and anterior chest wall. The large bore needle was placed into the internal jugular vein under U/S guidance without difficulty and then the Seldinger wire was advanced. Fluoroscopy was utilized to confirm that the Seldinger wire was in the superior  vena cava.  An incision was made and a port pocket developed with blunt and electrocautery dissection. The introducer dilator was placed over the Seldinger wire the wire was removed. The previously flushed catheter was placed into the introducer dilator and the peel-away sheath was removed. The catheter length was confirmed and trimmed utilizing fluoroscopy for proper positioning. The catheter was then attached to the previously flushed port. The port was placed into the pocket. The port was held in with 2-0 Prolenes and flushed for function and heparin locked.  The wound was closed with interrupted 3-0 Vicryl followed by 4-0 subcuticular Monocryl sutures. Dermabond used to coat the skin  Attention then was turned to the left chest where an elliptical incision was created avoiding any  Dog ears.  We also incorporated the previous lumpectomy site..  Inferior flaps were developed in the standard fashion with divided Cooper ligaments and her borders of dissections were sternal border medially the inframammary fold caudally the clavicle cranially and the latissimus dorsi laterally.  Please note that we performed our mastectomy using electrocautery and obtain excellent hemostasis.  We also incorporated both the lumpectomy cavity as well as the axillary sentinel node biopsy cavity as well.  Specimen was oriented on [facility] for permanent pathology.  There was no evidence of any gross metastatic lymphadenopathy. Wound was irrigated and there was excellent hemostasis.  A 19 [place] Blake drain was placed within the chest wall.  He was secured to the skin using a 3-0 nylon. The flaps were approximated using multiple 2-0 Vicryls and the skin was closed with 4-0 Monocryl  in a subcuticular fashion.  Dermabond was placed.  Fluffs and binder was also applied.   Patient was taken to the recovery room in stable condition where a postoperative chest film has been ordered.

## 2019-07-10 NOTE — Anesthesia Preprocedure Evaluation (Addendum)
Anesthesia Evaluation  Patient identified by MRN, date of birth, ID band Patient awake    Reviewed: Allergy & Precautions, H&P , NPO status , Patient's Chart, lab work & pertinent test results  History of Anesthesia Complications (+) PONV and history of anesthetic complications  Airway Mallampati: III  TM Distance: >3 FB Neck ROM: full    Dental  (+) Chipped   Pulmonary neg pulmonary ROS, neg shortness of breath, neg COPD,           Cardiovascular hypertension, (-) angina(-) Past MI and (-) Cardiac Stents (-) dysrhythmias      Neuro/Psych negative neurological ROS  negative psych ROS   GI/Hepatic negative GI ROS, Neg liver ROS,   Endo/Other  Morbid obesity  Renal/GU      Musculoskeletal   Abdominal   Peds  Hematology negative hematology ROS (+)   Anesthesia Other Findings Past Medical History: No date: Complication of anesthesia No date: Hyperlipidemia No date: Hypertension 05/19/2019: Malignant neoplasm of upper-outer quadrant of left breast  in female, estrogen receptor positive ([place]) No date: PONV (postoperative nausea and vomiting) No date: Vertigo     Comment:  intermittent especially lying on left side  Past Surgical History: 2004: ABDOMINAL HYSTERECTOMY     Comment:  complete 05/05/2019: BREAST BIOPSY; Left     Comment:  [facility]- ribbon clip 05/16/2019: BREAST BIOPSY; Left     Comment:  us-DCIS 05/16/2019: BREAST BIOPSY; Left     Comment:  [facility]- X clip 06/05/2019: BREAST BIOPSY WITH SENTINEL LYMPH NODE BIOPSY AND NEEDLE  LOCALIZATION; Left     Comment:  Procedure: BREAST BIOPSY WITH SENTINEL LYMPH NODE BIOPSY              AND NEEDLE LOCALIZATION, LEFT, BRACKETING WIRE;  Surgeon:              Robert Bellow, MD;  Location: [facility] ORS;  Service:               General;  Laterality: Left; age 68: BREAST CYST ASPIRATION; Bilateral     Comment:  neg No date: CHOLECYSTECTOMY 08/02/2018: COLONOSCOPY WITH  PROPOFOL; N/A     Comment:  Procedure: COLONOSCOPY WITH PROPOFOL;  Surgeon:               [name], MD;  Location: [facility] ENDOSCOPY;                Service: Endoscopy;  Laterality: N/A; No date: DILATION AND CURETTAGE OF UTERUS No date: goiter surgery No date: OOPHORECTOMY No date: THYROIDECTOMY, PARTIAL     Reproductive/Obstetrics negative OB ROS                            Anesthesia Physical Anesthesia Plan  ASA: II  Anesthesia Plan: General ETT   Post-op Pain Management:    Induction:   PONV Risk Score and Plan: Ondansetron, Dexamethasone and Treatment may vary due to age or medical condition  Airway Management Planned:   Additional Equipment:   Intra-op Plan:   Post-operative Plan:   Informed Consent: I have reviewed the patients History and Physical, chart, labs and discussed the procedure including the risks, benefits and alternatives for the proposed anesthesia with the patient or authorized representative who has indicated his/her understanding and acceptance.     Dental Advisory Given  Plan Discussed with: Anesthesiologist and CRNA  Anesthesia Plan Comments:         Anesthesia Quick Evaluation

## 2019-07-10 NOTE — Anesthesia Procedure Notes (Signed)
Procedure Name: Intubation Performed by: Kelton Pillar, CRNA Pre-anesthesia Checklist: Patient identified, Emergency Drugs available, Suction available and Patient being monitored Patient Re-evaluated:Patient Re-evaluated prior to induction Oxygen Delivery Method: Circle system utilized Preoxygenation: Pre-oxygenation with 100% oxygen Induction Type: IV induction Ventilation: Mask ventilation without difficulty Laryngoscope Size: Mac and 3 Grade View: Grade I Tube type: Oral Tube size: 7.0 mm Number of attempts: 1 Airway Equipment and Method: Stylet Placement Confirmation: ETT inserted through vocal cords under direct vision,  positive ETCO2,  CO2 detector and breath sounds checked- equal and bilateral Secured at: 22 cm Tube secured with: Tape Dental Injury: Teeth and Oropharynx as per pre-operative assessment

## 2019-07-10 NOTE — Anesthesia Postprocedure Evaluation (Signed)
Anesthesia Post Note  Patient: Amanda Wade  Procedure(s) Performed: SIMPLE MASTECTOMY.; LEFT (Left ) INSERTION PORT-A-CATH (Left )  Patient location during evaluation: PACU Anesthesia Type: General Level of consciousness: awake and alert Pain management: pain level controlled Vital Signs Assessment: post-procedure vital signs reviewed and stable Respiratory status: spontaneous breathing, nonlabored ventilation, respiratory function stable and patient connected to nasal cannula oxygen Cardiovascular status: blood pressure returned to baseline and stable Postop Assessment: no apparent nausea or vomiting Anesthetic complications: no     Last Vitals:  Vitals:   07/10/19 1331 07/10/19 1342  BP: 106/64 (!) 116/51  Pulse: 79 71  Resp: 12 16  Temp: 36.5 C (!) 36.3 C  SpO2: 97% 97%    Last Pain:  Vitals:   07/10/19 1342  TempSrc: Temporal  PainSc: Pinch

## 2019-07-10 NOTE — Anesthesia Post-op Follow-up Note (Signed)
Anesthesia QCDR form completed.        

## 2019-07-10 NOTE — Transfer of Care (Signed)
Immediate Anesthesia Transfer of Care Note  Patient: Amanda Wade  Procedure(s) Performed: SIMPLE MASTECTOMY.; LEFT (Left ) INSERTION PORT-A-CATH (Left )  Patient Location: PACU  Anesthesia Type:General  Level of Consciousness: awake, alert , oriented and patient cooperative  Airway & Oxygen Therapy: Patient Spontanous Breathing and Patient connected to face mask oxygen  Post-op Assessment: Report given to RN and Post -op Vital signs reviewed and stable  Post vital signs: Reviewed and stable  Last Vitals:  Vitals Value Taken Time  BP 141/63 07/10/19 1211  Temp    Pulse 82 07/10/19 1213  Resp 14 07/10/19 1213  SpO2 98 % 07/10/19 1213  Vitals shown include unvalidated device data.  Last Pain:  Vitals:   07/10/19 0833  TempSrc: Tympanic  PainSc: 0-No pain         Complications: No apparent anesthesia complications

## 2019-07-12 ENCOUNTER — Encounter: Payer: Medicare Other | Admitting: Surgery

## 2019-07-13 LAB — SURGICAL PATHOLOGY

## 2019-07-17 ENCOUNTER — Other Ambulatory Visit: Payer: Self-pay

## 2019-07-17 ENCOUNTER — Encounter: Payer: Self-pay | Admitting: Surgery

## 2019-07-17 ENCOUNTER — Ambulatory Visit (INDEPENDENT_AMBULATORY_CARE_PROVIDER_SITE_OTHER): Payer: Medicare Other | Admitting: Surgery

## 2019-07-17 VITALS — BP 164/81 | HR 84 | Temp 97.2°F | Ht 61.0 in | Wt 203.8 lb

## 2019-07-17 DIAGNOSIS — Z09 Encounter for follow-up examination after completed treatment for conditions other than malignant neoplasm: Secondary | ICD-10-CM

## 2019-07-17 NOTE — Patient Instructions (Addendum)
Follow up on Friday with Zach to check drain.  Follow up 2 weeks with Dr Dahlia Byes  Please be sure to strip the drain tube daily to help clear any clots   [facility] Surgical drains are used to remove extra fluid that normally builds up in a surgical wound after surgery. A surgical drain helps to heal a surgical wound. Different kinds of surgical drains include:  Active drains. These drains use suction to pull drainage away from the surgical wound. Drainage flows through a tube to a container outside of the body. With these drains, you need to keep the bulb or the drainage container flat (compressed) at all times, except while you empty it. Flattening the bulb or container creates suction.  Passive drains. These drains allow fluid to drain naturally, by gravity. Drainage flows through a tube to a bandage (dressing) or a container outside of the body. Passive drains do not need to be emptied. A drain is placed during surgery. Right after surgery, drainage is usually bright red and a little thicker than water. The drainage may gradually turn yellow or pink and become thinner. It is likely that your health care provider will remove the drain when the drainage stops or when the amount decreases to 1-2 Tbsp (15-30 mL) during a 24-hour period. Supplies needed:  Tape.  Germ-free cleaning solution (sterile saline).  Cotton swabs.  Split gauze drain sponge: 4 x 4 inches (10 x 10 cm).  Gauze square: 4 x 4 inches (10 x 10 cm). How to care for your surgical drain Care for your drain as told by your health care provider. This is important to help prevent infection. If your drain is placed at your back, or any other hard-to-reach area, ask another person to assist you in performing the following tasks: General care  Keep the skin around the drain dry and covered with a dressing at all times.  Check your drain area every day for signs of infection. Check for: ? Redness, swelling, or pain.  ? Pus or a bad smell. ? Cloudy drainage. ? Tenderness or pressure at the drain exit site. Changing the dressing Follow instructions from your health care provider about how to change your dressing. Change your dressing at least once a day. Change it more often if needed to keep the dressing dry. Make sure you: 1. Gather your supplies. 2. Wash your hands with soap and water before you change your dressing. If soap and water are not available, use hand sanitizer. 3. Remove the old dressing. Avoid using scissors to do that. 4. Wash your hands with soap and water again after removing the old dressing. 5. Use sterile saline to clean your skin around the drain. You may need to use a cotton swab to clean the skin. 6. Place the tube through the slit in a drain sponge. Place the drain sponge so that it covers your wound. 7. Place the gauze square or another drain sponge on top of the drain sponge that is on the wound. Make sure the tube is between those layers. 8. Tape the dressing to your skin. 9. Tape the drainage tube to your skin 1-2 inches (2.5-5 cm) below the place where the tube enters your body. Taping keeps the tube from pulling on any stitches (sutures) that you have. 10. Wash your hands with soap and water. 11. Write down the color of your drainage and how often you change your dressing. How to empty your active drain  1.  Make sure that you have a measuring cup that you can empty your drainage into. 2. Wash your hands with soap and water. If soap and water are not available, use hand sanitizer. 3. Loosen any pins or clips that hold the tube in place. 4. If your health care provider tells you to strip the tube to prevent clots and tube blockages: ? Hold the tube at the skin with one hand. Use your other hand to pinch the tubing with your thumb and first finger, or you may use a smooth pen/pencil.(like curling a ribbon) ? Gently move your fingers down the tube while squeezing very lightly.  This clears any drainage, clots, or tissue from the tube. ? You may need to do this several times each day to keep the tube clear. Do not pull on the tube. 5. Open the bulb cap or the drain plug. Do not touch the inside of the cap or the bottom of the plug. 6. Turn the device upside down and gently squeeze. 7. Empty all of the drainage into the measuring cup. 8. Compress the bulb or the container and replace the cap or the plug. To compress the bulb or the container, squeeze it firmly in the middle while you close the cap or plug the container. 9. Write down the amount of drainage that you have in each 24-hour period. If you have less than 2 Tbsp (30 mL) of drainage during 24 hours, contact your health care provider. 10. Flush the drainage down the toilet. 11. Wash your hands with soap and water. Contact a health care provider if:  You have redness, swelling, or pain around your drain area.  You have pus or a bad smell coming from your drain area.  You have a fever or chills.  The skin around your drain is warm to the touch.  The amount of drainage that you have is increasing instead of decreasing.  You have drainage that is cloudy.  There is a sudden stop or a sudden decrease in the amount of drainage that you have.  Your drain tube falls out.  Your active drain does not stay compressed after you empty it. Summary  Surgical drains are used to remove extra fluid that normally builds up in a surgical wound after surgery.  Different kinds of surgical drains include active drains and passive drains. Active drains use suction to pull drainage away from the surgical wound, and passive drains allow fluid to drain naturally.  It is important to care for your drain to prevent infection. If your drain is placed at your back, or any other hard-to-reach area, ask another person to assist you.  Contact your health care provider if you have redness, swelling, or pain around your drain area.  This information is not intended to replace advice given to you by your health care provider. Make sure you discuss any questions you have with your health care provider. Document Released: 11/06/2000 Document Revised: 12/14/2018 Document Reviewed: 12/14/2018 Elsevier Patient Education  2020 [employer].

## 2019-07-18 ENCOUNTER — Encounter: Payer: Self-pay | Admitting: Surgery

## 2019-07-18 NOTE — Progress Notes (Signed)
s/p Mastectomy and port. Doing well Pth d./w pt in detail Still 45-80cc serous fluid from JP   PE : NAD Flaps healing well, incision c/d/i. JP serous fluid. Some clots aspirated  A/P Doing well Not ready to remove jp F/U 5 days for drain removal

## 2019-07-21 ENCOUNTER — Encounter: Payer: Self-pay | Admitting: Physician Assistant

## 2019-07-21 ENCOUNTER — Other Ambulatory Visit: Payer: Self-pay | Admitting: General Surgery

## 2019-07-21 ENCOUNTER — Other Ambulatory Visit: Payer: Self-pay

## 2019-07-21 ENCOUNTER — Ambulatory Visit (INDEPENDENT_AMBULATORY_CARE_PROVIDER_SITE_OTHER): Payer: Medicare Other | Admitting: Physician Assistant

## 2019-07-21 VITALS — BP 135/85 | HR 78 | Temp 97.9°F | Ht 61.0 in | Wt 205.0 lb

## 2019-07-21 DIAGNOSIS — Z09 Encounter for follow-up examination after completed treatment for conditions other than malignant neoplasm: Secondary | ICD-10-CM

## 2019-07-21 DIAGNOSIS — Z170 Estrogen receptor positive status [ER+]: Secondary | ICD-10-CM

## 2019-07-21 DIAGNOSIS — C50412 Malignant neoplasm of upper-outer quadrant of left female breast: Secondary | ICD-10-CM

## 2019-07-21 NOTE — Progress Notes (Signed)
[facility] POST-OP OFFICE VISIT  07/21/2019  HPI: Amanda Wade is a 68 y.o. female 11 days s/p left simple mastectomy for DCIS with invasive mammary carcinoma. She was seen in follow up on 08/24 with Dr Dahlia Byes and doing well. JP at that time still with 45-80 ccs a day of serous fluid.   Today, she continues to do well. No issues with pain. No fever, chills, or drainage from incision. JP has put out less than 1 cc and hour for the last 3 days. Drainage remains serous. No other issues  Vital signs: BP 135/85   Pulse 78   Temp 97.9 F (36.6 C)   Ht 5' 1"  (1.549 m)   Wt 205 lb (93 kg)   SpO2 98%   BMI 38.73 kg/m    Physical Exam: Constitutional: Well appearing female, NAD Left Breast: Mastectomy incision is CDI with dermabond, there is ecchymosis, no erythema or drainage. JP in lateral left chest wall, minimal serous output, this was removed.   Assessment/Plan: This is a 68 y.o. female 11 days s/p left simple mastectomy.   - JP removed, occlusive dressing placed  - pain control prn  - return precautions given  - follow up as scheduled on 09/09  -- Edison Simon, PA-C [facility] 07/21/2019, 8:59 AM [phone] M-F: 7am - 4pm

## 2019-07-21 NOTE — Patient Instructions (Signed)
Follow up as scheduled.  You may shower. You can remove the dressing in 2 days. You can use a Band-Aid on the are if needed.  You may use the binder as needed.

## 2019-07-25 ENCOUNTER — Other Ambulatory Visit: Payer: Self-pay | Admitting: *Deleted

## 2019-07-25 DIAGNOSIS — C50412 Malignant neoplasm of upper-outer quadrant of left female breast: Secondary | ICD-10-CM

## 2019-07-25 DIAGNOSIS — Z170 Estrogen receptor positive status [ER+]: Secondary | ICD-10-CM

## 2019-07-26 ENCOUNTER — Telehealth: Payer: Self-pay

## 2019-07-26 NOTE — Telephone Encounter (Signed)
Attempted to contact pt prior to appt. on 07/27/2019 with Dr. Janese Banks for a pre-visit assessment. Msg left.

## 2019-07-27 ENCOUNTER — Other Ambulatory Visit: Payer: Self-pay

## 2019-07-27 ENCOUNTER — Encounter: Payer: Self-pay | Admitting: Surgery

## 2019-07-27 ENCOUNTER — Encounter: Payer: Self-pay | Admitting: *Deleted

## 2019-07-27 ENCOUNTER — Inpatient Hospital Stay: Payer: Medicare Other | Attending: Oncology | Admitting: Oncology

## 2019-07-27 ENCOUNTER — Encounter: Payer: Self-pay | Admitting: Oncology

## 2019-07-27 VITALS — BP 107/68 | HR 81 | Temp 97.8°F | Resp 20 | Ht 61.0 in | Wt 204.0 lb

## 2019-07-27 DIAGNOSIS — Z5189 Encounter for other specified aftercare: Secondary | ICD-10-CM | POA: Diagnosis not present

## 2019-07-27 DIAGNOSIS — E785 Hyperlipidemia, unspecified: Secondary | ICD-10-CM | POA: Diagnosis not present

## 2019-07-27 DIAGNOSIS — E876 Hypokalemia: Secondary | ICD-10-CM | POA: Diagnosis not present

## 2019-07-27 DIAGNOSIS — Z9221 Personal history of antineoplastic chemotherapy: Secondary | ICD-10-CM | POA: Insufficient documentation

## 2019-07-27 DIAGNOSIS — Z79899 Other long term (current) drug therapy: Secondary | ICD-10-CM | POA: Diagnosis not present

## 2019-07-27 DIAGNOSIS — Z7982 Long term (current) use of aspirin: Secondary | ICD-10-CM | POA: Diagnosis not present

## 2019-07-27 DIAGNOSIS — I10 Essential (primary) hypertension: Secondary | ICD-10-CM | POA: Diagnosis not present

## 2019-07-27 DIAGNOSIS — Z803 Family history of malignant neoplasm of breast: Secondary | ICD-10-CM | POA: Diagnosis not present

## 2019-07-27 DIAGNOSIS — Z170 Estrogen receptor positive status [ER+]: Secondary | ICD-10-CM | POA: Insufficient documentation

## 2019-07-27 DIAGNOSIS — Z7189 Other specified counseling: Secondary | ICD-10-CM | POA: Diagnosis not present

## 2019-07-27 DIAGNOSIS — Z5111 Encounter for antineoplastic chemotherapy: Secondary | ICD-10-CM | POA: Insufficient documentation

## 2019-07-27 DIAGNOSIS — C50412 Malignant neoplasm of upper-outer quadrant of left female breast: Secondary | ICD-10-CM | POA: Diagnosis present

## 2019-07-27 MED ORDER — PROCHLORPERAZINE MALEATE 10 MG PO TABS: 10.0000 mg | ORAL_TABLET | Freq: Four times a day (QID) | ORAL | 1 refills | Status: DC | PRN

## 2019-07-27 MED ORDER — LIDOCAINE-PRILOCAINE 2.5-2.5 % EX CREA: TOPICAL_CREAM | CUTANEOUS | 3 refills | Status: DC

## 2019-07-27 MED ORDER — DEXAMETHASONE 4 MG PO TABS: 8.0000 mg | ORAL_TABLET | Freq: Two times a day (BID) | ORAL | 1 refills | Status: DC

## 2019-07-27 MED ORDER — ONDANSETRON HCL 8 MG PO TABS: 8.0000 mg | ORAL_TABLET | Freq: Two times a day (BID) | ORAL | 1 refills | Status: DC | PRN

## 2019-07-27 NOTE — Progress Notes (Signed)
START ON PATHWAY REGIMEN - Breast     A cycle is every 21 days:     Docetaxel      Cyclophosphamide   **Always confirm dose/schedule in your pharmacy ordering system**  Patient Characteristics: Postoperative without Neoadjuvant Therapy (Pathologic Staging), Invasive Disease, Adjuvant Therapy, HER2 Negative/Unknown/Equivocal, ER Positive, Node Positive, Node Positive (1-3), MammaPrint(R) Ordered, High Genomic Risk Therapeutic Status: Postoperative without Neoadjuvant Therapy (Pathologic Staging) AJCC Grade: G1 AJCC N Category: pN1a AJCC M Category: cM0 ER Status: Positive (+) AJCC 8 Stage Grouping: IA HER2 Status: Negative (-) Oncotype Dx Recurrence Score: Ordered Other Genomic Test AJCC T Category: pT1b PR Status: Positive (+) Has this patient completed genomic testing<= Yes - MammaPrint(R) MammaPrint(R) Score: High Genomic Risk Intent of Therapy: Curative Intent, Discussed with Patient

## 2019-07-27 NOTE — Progress Notes (Signed)
No complaints today. Has a few questions regarding chemo and son visiting. Has chemo class next week.

## 2019-07-28 ENCOUNTER — Ambulatory Visit: Payer: Medicare Other | Admitting: Oncology

## 2019-07-28 NOTE — Progress Notes (Signed)
Hematology/Oncology Consult note [facility]  [phone] Fax:([phone]  Patient Care Team: Ezequiel Kayser, MD as PCP - General (Internal Medicine)   Name of the patient: Amanda Wade  [id number]  [birth year]   Date of visit: 07/28/19  Diagnosis- pathological prognostic stage IA [id number] Invasive mammary carcinoma of the left breast status post lumpectomy and sentinel lymph node biopsy   Chief complaint/ Reason for visit-discuss postmastectomy pathology results and further management  Heme/Onc history: Patient is a 68 year old postmenopausal female with a past medical history significant for hypertension hyperlipidemia among other medical problems. She recently underwent a screening mammogram on 04/28/2019 which showed possible mass in the left breast. This was followed by diagnostic mammogram and ultrasound which showed bilobar hypo-Echoic mass at the 2:30 position of the left breast measuring 0.5 x 0.6 x 1.1 cm. Hypoechoic mass at the 2 o'clock position as well. And another bilobed irregular hypoechoic mass at the 3 o'clock position measuring 4 x 5 x 6 mm. Patient had biopsy of all these 3 masses. The 3 o'clock position breast mass came back as invasive mammary carcinoma 6 mm, grade 1 strongly ER PR positive greater than 90% and HER-2/neu negative. 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 230 breast mass also came back as intermediate grade DCIS. There was also another mass noted in the left breast which was biopsied and was 5 mm grade 1 ER PR positive and HER-2/neu negative.  Patient was taken for lumpectomy and sentinel lymph node biopsy by Dr. Bary Castilla on 06/05/2019.Final pathology showed invasive mammary carcinoma grade 1 ER PR positive and HER-2/neu negative at 4 different biopsy sites with extensive DCIS predominantly micropapillary type.  One lymph node was also involved with macro metastatic carcinoma and was negative for  extranodal extension  Patient's case was discussed at tumor board and there was a concern that there may be potentially more areas of abnormality given extensive DCIS and invasive cancer seen in multiple foci in the lumpectomy specimen.  Plan was to proceed with bilateral MRI.Breast MRI showed a large area of clumped discontinued non-mass enhancement in the left upper breast spanning an area of 9.8 cm in AP dimension suspicious for residual DCIS or invasive carcinoma.  Patient therefore proceeded with r left mastectomy which showed small area of residual DCIS but no evidence of invasive malignancy.  Patient also had a MammaPrint testing done on her lumpectomy specimen which came back as high risk group.  29% chance of recurrence without chemotherapy at 5 years.  Greater than 12% chemo benefit and estimated > 93% survival at 5 years with chemotherapy and hormone therapy.  Interval history- she is doing well post mastectomy. Denies any specific complaints today  ECOG PS- 1 Pain scale- 0   Review of systems- Review of Systems  Constitutional: Negative for chills, fever, malaise/fatigue and weight loss.  HENT: Negative for congestion, ear discharge and nosebleeds.   Eyes: Negative for blurred vision.  Respiratory: Negative for cough, hemoptysis, sputum production, shortness of breath and wheezing.   Cardiovascular: Negative for chest pain, palpitations, orthopnea and claudication.  Gastrointestinal: Negative for abdominal pain, blood in stool, constipation, diarrhea, heartburn, melena, nausea and vomiting.  Genitourinary: Negative for dysuria, flank pain, frequency, hematuria and urgency.  Musculoskeletal: Negative for back pain, joint pain and myalgias.  Skin: Negative for rash.  Neurological: Negative for dizziness, tingling, focal weakness, seizures, weakness and headaches.  Endo/Heme/Allergies: Does not bruise/bleed easily.  Psychiatric/Behavioral: Negative for depression and  suicidal  ideas. The patient does not have insomnia.       Allergies  Allergen Reactions  . Shellfish Allergy Rash  . Sulfa Antibiotics Rash     Past Medical History:  Diagnosis Date  . Complication of anesthesia   . Hyperlipidemia   . Hypertension   . Malignant neoplasm of upper-outer quadrant of left breast in female, estrogen receptor positive ([place]) 05/19/2019  . PONV (postoperative nausea and vomiting)   . Vertigo    intermittent especially lying on left side     Past Surgical History:  Procedure Laterality Date  . ABDOMINAL HYSTERECTOMY  2004   complete  . BREAST BIOPSY Left 05/05/2019   [facility]- ribbon clip  . BREAST BIOPSY Left 05/16/2019   [facility]-DCIS  . BREAST BIOPSY Left 05/16/2019   [facility]- X clip  . BREAST BIOPSY WITH SENTINEL LYMPH NODE BIOPSY AND NEEDLE LOCALIZATION Left 06/05/2019   Procedure: BREAST BIOPSY WITH SENTINEL LYMPH NODE BIOPSY AND NEEDLE LOCALIZATION, LEFT, BRACKETING WIRE;  Surgeon: Robert Bellow, MD;  Location: [facility] ORS;  Service: General;  Laterality: Left;  . BREAST CYST ASPIRATION Bilateral age 68   neg  . CHOLECYSTECTOMY    . COLONOSCOPY WITH PROPOFOL N/A 08/02/2018   Procedure: COLONOSCOPY WITH PROPOFOL;  Surgeon: [name], MD;  Location: [facility];  Service: Endoscopy;  Laterality: N/A;  . DILATION AND CURETTAGE OF UTERUS    . goiter surgery    . OOPHORECTOMY    . PORTACATH PLACEMENT Left 07/10/2019   Procedure: INSERTION PORT-A-CATH;  Surgeon: Jules Husbands, MD;  Location: [facility] ORS;  Service: General;  Laterality: Left;  . SIMPLE MASTECTOMY WITH AXILLARY SENTINEL NODE BIOPSY Left 07/10/2019   Procedure: SIMPLE MASTECTOMY.; LEFT;  Surgeon: Jules Husbands, MD;  Location: [facility] ORS;  Service: General;  Laterality: Left;  . THYROIDECTOMY, PARTIAL      Social History   Socioeconomic History  . Marital status: Divorced    Spouse name: Not on file  . Number of children: Not on file  . Years of education: Not on file  . Highest education  level: Not on file  Occupational History  . Not on file  Social Needs  . Financial resource strain: Not on file  . Food insecurity    Worry: Not on file    Inability: Not on file  . Transportation needs    Medical: Not on file    Non-medical: Not on file  Tobacco Use  . Smoking status: Never Smoker  . Smokeless tobacco: Never Used  Substance and Sexual Activity  . Alcohol use: No  . Drug use: No  . Sexual activity: Not Currently  Lifestyle  . Physical activity    Days per week: Not on file    Minutes per session: Not on file  . Stress: Not on file  Relationships  . Social Herbalist on phone: Not on file    Gets together: Not on file    Attends religious service: Not on file    Active member of club or organization: Not on file    Attends meetings of clubs or organizations: Not on file    Relationship status: Not on file  . Intimate partner violence    Fear of current or ex partner: Not on file    Emotionally abused: Not on file    Physically abused: Not on file    Forced sexual activity: Not on file  Other Topics Concern  . Not on  file  Social History Narrative  . Not on file    Family History  Problem Relation Age of Onset  . Breast cancer Cousin        mat cousin  . Hypertension Mother   . Heart attack Father   . Prostate cancer Father   . Diabetes Father   . Colon polyps Father      Current Outpatient Medications:  .  acetaminophen (TYLENOL) 500 MG tablet, Take 500 mg by mouth every 6 (six) hours as needed (for pain/headaches.)., Disp: , Rfl:  .  aspirin EC 81 MG tablet, Take 81 mg by mouth daily., Disp: , Rfl:  .  atorvastatin (LIPITOR) 10 MG tablet, Take 10 mg by mouth daily. , Disp: , Rfl:  .  Calcium Carbonate-Vitamin D 600-400 MG-UNIT tablet, Take 1 tablet by mouth daily. , Disp: , Rfl:  .  lisinopril-hydrochlorothiazide (PRINZIDE,ZESTORETIC) 20-12.5 MG tablet, Take 1 tablet by mouth daily., Disp: , Rfl:  .  Multiple Vitamin (MULTIVITAMIN  WITH MINERALS) TABS tablet, Take 1 tablet by mouth daily after breakfast. Centrum Silver, Disp: , Rfl:  .  dexamethasone (DECADRON) 4 MG tablet, Take 2 tablets (8 mg total) by mouth 2 (two) times daily. Start the day before Taxotere. Then again the day after chemo for 3 days., Disp: 30 tablet, Rfl: 1 .  lidocaine-prilocaine (EMLA) cream, Apply to affected area once, Disp: 30 g, Rfl: 3 .  ondansetron (ZOFRAN) 8 MG tablet, Take 1 tablet (8 mg total) by mouth 2 (two) times daily as needed for refractory nausea / vomiting. Start on day 3 after chemo., Disp: 30 tablet, Rfl: 1 .  prochlorperazine (COMPAZINE) 10 MG tablet, Take 1 tablet (10 mg total) by mouth every 6 (six) hours as needed (Nausea or vomiting)., Disp: 30 tablet, Rfl: 1  Physical exam:  Vitals:   07/27/19 1009 07/27/19 1014  BP: 91/76 107/68  Pulse: 81   Resp: 20   Temp: 97.8 F (36.6 C)   TempSrc: Tympanic   Weight: 204 lb (92.5 kg)   Height: _0  (1.549 m)    Physical Exam HENT:     Head: Normocephalic and atraumatic.  Eyes:     Pupils: Pupils are equal, round, and reactive to light.  Neck:     Musculoskeletal: Normal range of motion.  Cardiovascular:     Rate and Rhythm: Normal rate and regular rhythm.     Heart sounds: Normal heart sounds.  Pulmonary:     Effort: Pulmonary effort is normal.     Breath sounds: Normal breath sounds.  Abdominal:     General: Bowel sounds are normal.     Palpations: Abdomen is soft.  Skin:    General: Skin is warm and dry.  Neurological:     Mental Status: She is alert and oriented to person, place, and time.   Patient is status post left mastectomy with a well-healed surgical scar.  CMP Latest Ref Rng & Units 07/06/2019  Glucose 65 - 99 mg/dL -  BUN 6 - 20 mg/dL -  Creatinine 0.44 - 1.00 mg/dL -  Sodium 135 - 145 mmol/L -  Potassium 3.5 - 5.1 mmol/L 3.5  Chloride 101 - 111 mmol/L -  CO2 22 - 32 mmol/L -  Calcium 8.9 - 10.3 mg/dL -   CBC Latest Ref Rng & Units 06/01/2019  WBC  4.0 - 10.5 K/uL 8.0  Hemoglobin 12.0 - 15.0 g/dL 12.9  Hematocrit 36.0 - 46.0 % 37.7  Platelets 150 - 400  K/uL 321       Dg Chest Port 1 View  Addendum Date: 07/10/2019   ADDENDUM REPORT: 07/10/2019 13:21 ADDENDUM: The report was inadvertently signed before the repeat chest radiograph to include the entire Port-A-Cath was performed. A RIGHT IJ Port-A-Cath is noted with tip overlying the mid SVC. Cardiomediastinal silhouette is unremarkable. Mild pulmonary vascular congestion is noted. There is no evidence of pneumothorax or acute bony abnormality. IMPRESSION: RIGHT IJ Port-A-Cath with tip overlying the mid SVC. No pneumothorax. Mild pulmonary vascular congestion. Electronically Signed   By: Margarette Canada M.D.   On: 07/10/2019 13:21   Result Date: 07/10/2019 CLINICAL DATA:  Port-A-Cath placement. EXAM: PORTABLE CHEST 1 VIEW COMPARISON:  None. FINDINGS: The heart size and mediastinal contours are within normal limits. Both lungs are clear. The visualized skeletal structures are unremarkable. IMPRESSION: No active disease. Electronically Signed: By: Margarette Canada M.D. On: 07/10/2019 12:42   Dg C-arm 1-60 Min-no Report  Result Date: 07/10/2019 Fluoroscopy was utilized by the requesting physician.  No radiographic interpretation.     Assessment and plan- Patient is a 68 y.o. female with invasive mammary carcinoma of the left breast pathological prognostic stage I [id number] ER PR positive HER-2/neu negative status post lumpectomy and sentinel lymph node biopsy. She is high risk mammprint score and s/p mastectomy and here to discuss further management  Patient underwent left mastectomy after she had MRI which showed more areas of enhancement in the left breast and extensive dcis on her lumpectomy specien. Mastectomy showed small focus of residual DCIS but not invasive cancer  Given that she was high risk mammaprint- she would benefit from adjuvant chemotherapy as discussed before TC X4 starting  next week. She is healing well from her mastectomy wound.  I will see her next wek to start chemotherapy. Chemotherapy is being given with a curative intent   Visit Diagnosis 1. Malignant neoplasm of upper-outer quadrant of left breast in female, estrogen receptor positive ([place])   2. Goals of care, counseling/discussion      Dr. Randa Evens, MD, MPH [facility] at [facility] [phone] 07/28/2019 4:04 PM

## 2019-07-28 NOTE — Patient Instructions (Signed)
Docetaxel injection What is this medicine? DOCETAXEL (doe se TAX el) is a chemotherapy drug. It targets fast dividing cells, like cancer cells, and causes these cells to die. This medicine is used to treat many types of cancers like breast cancer, certain stomach cancers, head and neck cancer, lung cancer, and prostate cancer. This medicine may be used for other purposes; ask your health care provider or pharmacist if you have questions. COMMON BRAND NAME(S): Docefrez, Taxotere What should I tell my health care provider before I take this medicine? They need to know if you have any of these conditions:  infection (especially a virus infection such as chickenpox, cold sores, or herpes)  liver disease  low blood counts, like low white cell, platelet, or red cell counts  an unusual or allergic reaction to docetaxel, polysorbate 80, other chemotherapy agents, other medicines, foods, dyes, or preservatives  pregnant or trying to get pregnant  breast-feeding How should I use this medicine? This drug is given as an infusion into a vein. It is administered in a hospital or clinic by a specially trained health care professional. Talk to your pediatrician regarding the use of this medicine in children. Special care may be needed. Overdosage: If you think you have taken too much of this medicine contact a poison control center or emergency room at once. NOTE: This medicine is only for you. Do not share this medicine with others. What if I miss a dose? It is important not to miss your dose. Call your doctor or health care professional if you are unable to keep an appointment. What may interact with this medicine?  aprepitant  certain antibiotics like erythromycin or clarithromycin  certain antivirals for HIV or hepatitis  certain medicines for fungal infections like fluconazole, itraconazole, ketoconazole, posaconazole, or  voriconazole  cimetidine  ciprofloxacin  conivaptan  cyclosporine  dronedarone  fluvoxamine  grapefruit juice  imatinib  verapamil This list may not describe all possible interactions. Give your health care provider a list of all the medicines, herbs, non-prescription drugs, or dietary supplements you use. Also tell them if you smoke, drink alcohol, or use illegal drugs. Some items may interact with your medicine. What should I watch for while using this medicine? Your condition will be monitored carefully while you are receiving this medicine. You will need important blood work done while you are taking this medicine. Call your doctor or health care professional for advice if you get a fever, chills or sore throat, or other symptoms of a cold or flu. Do not treat yourself. This drug decreases your body's ability to fight infections. Try to avoid being around people who are sick. Some products may contain alcohol. Ask your health care professional if this medicine contains alcohol. Be sure to tell all health care professionals you are taking this medicine. Certain medicines, like metronidazole and disulfiram, can cause an unpleasant reaction when taken with alcohol. The reaction includes flushing, headache, nausea, vomiting, sweating, and increased thirst. The reaction can last from 30 minutes to several hours. You may get drowsy or dizzy. Do not drive, use machinery, or do anything that needs mental alertness until you know how this medicine affects you. Do not stand or sit up quickly, especially if you are an older patient. This reduces the risk of dizzy or fainting spells. Alcohol may interfere with the effect of this medicine. Talk to your health care professional about your risk of cancer. You may be more at risk for certain types of cancer if  you take this medicine. Do not become pregnant while taking this medicine or for 6 months after stopping it. Women should inform their doctor if  they wish to become pregnant or think they might be pregnant. There is a potential for serious side effects to an unborn child. Talk to your health care professional or pharmacist for more information. Do not breast-feed an infant while taking this medicine or for 1 to 2 weeks after stopping it. Males who get this medicine must use a condom during sex with females who can get pregnant. If you get a woman pregnant, the baby could have birth defects. The baby could die before they are born. You will need to continue wearing a condom for 3 months after stopping the medicine. Tell your health care provider right away if your partner becomes pregnant while you are taking this medicine. This may interfere with the ability to father a child. You should talk to your doctor or health care professional if you are concerned about your fertility. What side effects may I notice from receiving this medicine? Side effects that you should report to your doctor or health care professional as soon as possible:  allergic reactions like skin rash, itching or hives, swelling of the face, lips, or tongue  blurred vision  breathing problems  changes in vision  low blood counts - This drug may decrease the number of white blood cells, red blood cells and platelets. You may be at increased risk for infections and bleeding.  nausea and vomiting  pain, redness or irritation at site where injected  pain, tingling, numbness in the hands or feet  redness, blistering, peeling, or loosening of the skin, including inside the mouth  signs of decreased platelets or bleeding - bruising, pinpoint red spots on the skin, black, tarry stools, nosebleeds  signs of decreased red blood cells - unusually weak or tired, fainting spells, lightheadedness  signs of infection - fever or chills, cough, sore throat, pain or difficulty passing urine  swelling of the ankle, feet, hands Side effects that usually do not require medical  attention (report to your doctor or health care professional if they continue or are bothersome):  constipation  diarrhea  fingernail or toenail changes  hair loss  loss of appetite  mouth sores  muscle pain This list may not describe all possible side effects. Call your doctor for medical advice about side effects. You may report side effects to FDA at 1-800-FDA-1088. Where should I keep my medicine? This drug is given in a hospital or clinic and will not be stored at home. NOTE: This sheet is a summary. It may not cover all possible information. If you have questions about this medicine, talk to your doctor, pharmacist, or health care provider.  2020 Elsevier/Gold Standard (2019-01-13 12:23:11) Cyclophosphamide injection What is this medicine? CYCLOPHOSPHAMIDE (sye kloe FOSS fa mide) is a chemotherapy drug. It slows the growth of cancer cells. This medicine is used to treat many types of cancer like lymphoma, myeloma, leukemia, breast cancer, and ovarian cancer, to name a few. This medicine may be used for other purposes; ask your health care provider or pharmacist if you have questions. COMMON BRAND NAME(S): Cytoxan, Neosar What should I tell my health care provider before I take this medicine? They need to know if you have any of these conditions:  blood disorders  history of other chemotherapy  infection  kidney disease  liver disease  recent or ongoing radiation therapy  tumors in the  bone marrow  an unusual or allergic reaction to cyclophosphamide, other chemotherapy, other medicines, foods, dyes, or preservatives  pregnant or trying to get pregnant  breast-feeding How should I use this medicine? This drug is usually given as an injection into a vein or muscle or by infusion into a vein. It is administered in a hospital or clinic by a specially trained health care professional. Talk to your pediatrician regarding the use of this medicine in children. Special  care may be needed. Overdosage: If you think you have taken too much of this medicine contact a poison control center or emergency room at once. NOTE: This medicine is only for you. Do not share this medicine with others. What if I miss a dose? It is important not to miss your dose. Call your doctor or health care professional if you are unable to keep an appointment. What may interact with this medicine? This medicine may interact with the following medications:  amiodarone  amphotericin B  azathioprine  certain antiviral medicines for HIV or AIDS such as protease inhibitors (e.g., indinavir, ritonavir) and zidovudine  certain blood pressure medications such as benazepril, captopril, enalapril, fosinopril, lisinopril, moexipril, monopril, perindopril, quinapril, ramipril, trandolapril  certain cancer medications such as anthracyclines (e.g., daunorubicin, doxorubicin), busulfan, cytarabine, paclitaxel, pentostatin, tamoxifen, trastuzumab  certain diuretics such as chlorothiazide, chlorthalidone, hydrochlorothiazide, indapamide, metolazone  certain medicines that treat or prevent blood clots like warfarin  certain muscle relaxants such as succinylcholine  cyclosporine  etanercept  indomethacin  medicines to increase blood counts like filgrastim, pegfilgrastim, sargramostim  medicines used as general anesthesia  metronidazole  natalizumab This list may not describe all possible interactions. Give your health care provider a list of all the medicines, herbs, non-prescription drugs, or dietary supplements you use. Also tell them if you smoke, drink alcohol, or use illegal drugs. Some items may interact with your medicine. What should I watch for while using this medicine? Visit your doctor for checks on your progress. This drug may make you feel generally unwell. This is not uncommon, as chemotherapy can affect healthy cells as well as cancer cells. Report any side effects.  Continue your course of treatment even though you feel ill unless your doctor tells you to stop. Drink water or other fluids as directed. Urinate often, even at night. In some cases, you may be given additional medicines to help with side effects. Follow all directions for their use. Call your doctor or health care professional for advice if you get a fever, chills or sore throat, or other symptoms of a cold or flu. Do not treat yourself. This drug decreases your body's ability to fight infections. Try to avoid being around people who are sick. This medicine may increase your risk to bruise or bleed. Call your doctor or health care professional if you notice any unusual bleeding. Be careful brushing and flossing your teeth or using a toothpick because you may get an infection or bleed more easily. If you have any dental work done, tell your dentist you are receiving this medicine. You may get drowsy or dizzy. Do not drive, use machinery, or do anything that needs mental alertness until you know how this medicine affects you. Do not become pregnant while taking this medicine or for 1 year after stopping it. Women should inform their doctor if they wish to become pregnant or think they might be pregnant. Men should not father a child while taking this medicine and for 4 months after stopping it. There is  a potential for serious side effects to an unborn child. Talk to your health care professional or pharmacist for more information. Do not breast-feed an infant while taking this medicine. This medicine may interfere with the ability to have a child. This medicine has caused ovarian failure in some women. This medicine has caused reduced sperm counts in some men. You should talk with your doctor or health care professional if you are concerned about your fertility. If you are going to have surgery, tell your doctor or health care professional that you have taken this medicine. What side effects may I notice  from receiving this medicine? Side effects that you should report to your doctor or health care professional as soon as possible:  allergic reactions like skin rash, itching or hives, swelling of the face, lips, or tongue  low blood counts - this medicine may decrease the number of white blood cells, red blood cells and platelets. You may be at increased risk for infections and bleeding.  signs of infection - fever or chills, cough, sore throat, pain or difficulty passing urine  signs of decreased platelets or bleeding - bruising, pinpoint red spots on the skin, black, tarry stools, blood in the urine  signs of decreased red blood cells - unusually weak or tired, fainting spells, lightheadedness  breathing problems  dark urine  dizziness  palpitations  swelling of the ankles, feet, hands  trouble passing urine or change in the amount of urine  weight gain  yellowing of the eyes or skin Side effects that usually do not require medical attention (report to your doctor or health care professional if they continue or are bothersome):  changes in nail or skin color  hair loss  missed menstrual periods  mouth sores  nausea, vomiting This list may not describe all possible side effects. Call your doctor for medical advice about side effects. You may report side effects to FDA at 1-800-FDA-1088. Where should I keep my medicine? This drug is given in a hospital or clinic and will not be stored at home. NOTE: This sheet is a summary. It may not cover all possible information. If you have questions about this medicine, talk to your doctor, pharmacist, or health care provider.  2020 Elsevier/Gold Standard (2012-09-23 16:22:58)

## 2019-08-01 ENCOUNTER — Inpatient Hospital Stay (HOSPITAL_BASED_OUTPATIENT_CLINIC_OR_DEPARTMENT_OTHER): Payer: Medicare Other | Admitting: Hospice and Palliative Medicine

## 2019-08-01 ENCOUNTER — Telehealth: Payer: Self-pay | Admitting: *Deleted

## 2019-08-01 ENCOUNTER — Other Ambulatory Visit: Payer: Self-pay

## 2019-08-01 ENCOUNTER — Inpatient Hospital Stay: Payer: Medicare Other | Admitting: Nurse Practitioner

## 2019-08-01 ENCOUNTER — Inpatient Hospital Stay: Payer: Medicare Other

## 2019-08-01 DIAGNOSIS — C50412 Malignant neoplasm of upper-outer quadrant of left female breast: Secondary | ICD-10-CM | POA: Diagnosis not present

## 2019-08-01 DIAGNOSIS — Z170 Estrogen receptor positive status [ER+]: Secondary | ICD-10-CM

## 2019-08-01 NOTE — Progress Notes (Signed)
[facility]  Telephone:([phone] [phone]  Patient Care Team: Ezequiel Kayser, MD as PCP - General (Internal Medicine)   Name of the patient: Amanda Wade  [id number]  1951/10/03   Date of Visit: 08/01/19  Diagnosis: Stage Ia breast cancer  Current Treatment: Docetaxel and cyclophosphamide   Reason for Visit: This patient is a 68 y.o. female who presents to chemo care clinic today for initial meeting in preparation for starting chemotherapy. I introduced the chemo care clinic and we discussed that the role of the clinic is to assist those who are at an increased risk of emergency room visits and/or complications during the course of chemotherapy treatment. We discussed that the increased risk takes into account factors such as age, performance status, and co-morbidities. We also discussed that for some, this might include barriers to care such as not having a primary care provider, lack of insurance/transportation, or not being able to afford medications. We discussed that the goal of the program is to help prevent unplanned ER visits and help reduce complications during chemotherapy. We do this by discussing specific risk factors to each individual and identifying ways that we can help improve these risk factors and reduce barriers to care.   Hematology/Oncology History:  Oncology History  Malignant neoplasm of upper-outer quadrant of left breast in female, estrogen receptor positive ([place])  05/18/2019 Cancer Staging   Staging form: Breast, AJCC 8th Edition - Clinical stage from 05/18/2019: Stage IA (cT1c, cN0, cM0, G1, ER+, PR+, HER2-) - Signed by Sindy Guadeloupe, MD on 05/21/2019   05/19/2019 Initial Diagnosis   Malignant neoplasm of upper-outer quadrant of left breast in female, estrogen receptor positive ([place])   08/03/2019 -  Chemotherapy   The patient had PALONOSETRON HCL INJECTION 0.25 MG/5ML, 0.25 mg, Intravenous,  Once,  0 of 4 cycles pegfilgrastim-jmdb (FULPHILA) injection 6 mg, 6 mg, Subcutaneous,  Once, 0 of 4 cycles cyclophosphamide (CYTOXAN) 1,200 mg in sodium chloride 0.9 % 250 mL chemo infusion, 600 mg/m2, Intravenous,  Once, 0 of 4 cycles DOCEtaxel (TAXOTERE) 150 mg in sodium chloride 0.9 % 250 mL chemo infusion, 75 mg/m2, Intravenous,  Once, 0 of 4 cycles  for chemotherapy treatment.       Allergies  Allergen Reactions  . Shellfish Allergy Rash  . Sulfa Antibiotics Rash     Past Medical History:  Diagnosis Date  . Complication of anesthesia   . Hyperlipidemia   . Hypertension   . Malignant neoplasm of upper-outer quadrant of left breast in female, estrogen receptor positive ([place]) 05/19/2019  . PONV (postoperative nausea and vomiting)   . Vertigo    intermittent especially lying on left side     Past Surgical History:  Procedure Laterality Date  . ABDOMINAL HYSTERECTOMY  2004   complete  . BREAST BIOPSY Left 05/05/2019   [facility]- ribbon clip  . BREAST BIOPSY Left 05/16/2019   [facility]-DCIS  . BREAST BIOPSY Left 05/16/2019   [facility]- X clip  . BREAST BIOPSY WITH SENTINEL LYMPH NODE BIOPSY AND NEEDLE LOCALIZATION Left 06/05/2019   Procedure: BREAST BIOPSY WITH SENTINEL LYMPH NODE BIOPSY AND NEEDLE LOCALIZATION, LEFT, BRACKETING WIRE;  Surgeon: Robert Bellow, MD;  Location: [facility] ORS;  Service: General;  Laterality: Left;  . BREAST CYST ASPIRATION Bilateral age 24   neg  . CHOLECYSTECTOMY    . COLONOSCOPY WITH PROPOFOL N/A 08/02/2018   Procedure: COLONOSCOPY WITH PROPOFOL;  Surgeon: [name], MD;  Location: [facility] ENDOSCOPY;  Service: Endoscopy;  Laterality: N/A;  . DILATION AND CURETTAGE OF UTERUS    . goiter surgery    . OOPHORECTOMY    . PORTACATH PLACEMENT Left 07/10/2019   Procedure: INSERTION PORT-A-CATH;  Surgeon: Jules Husbands, MD;  Location: [facility] ORS;  Service: General;  Laterality: Left;  . SIMPLE MASTECTOMY WITH AXILLARY SENTINEL NODE BIOPSY Left 07/10/2019   Procedure:  SIMPLE MASTECTOMY.; LEFT;  Surgeon: Jules Husbands, MD;  Location: [facility] ORS;  Service: General;  Laterality: Left;  . THYROIDECTOMY, PARTIAL      Social History   Socioeconomic History  . Marital status: Divorced    Spouse name: Not on file  . Number of children: Not on file  . Years of education: Not on file  . Highest education level: Not on file  Occupational History  . Not on file  Social Needs  . Financial resource strain: Not on file  . Food insecurity    Worry: Not on file    Inability: Not on file  . Transportation needs    Medical: Not on file    Non-medical: Not on file  Tobacco Use  . Smoking status: Never Smoker  . Smokeless tobacco: Never Used  Substance and Sexual Activity  . Alcohol use: No  . Drug use: No  . Sexual activity: Not Currently  Lifestyle  . Physical activity    Days per week: Not on file    Minutes per session: Not on file  . Stress: Not on file  Relationships  . Social Herbalist on phone: Not on file    Gets together: Not on file    Attends religious service: Not on file    Active member of club or organization: Not on file    Attends meetings of clubs or organizations: Not on file    Relationship status: Not on file  . Intimate partner violence    Fear of current or ex partner: Not on file    Emotionally abused: Not on file    Physically abused: Not on file    Forced sexual activity: Not on file  Other Topics Concern  . Not on file  Social History Narrative  . Not on file    Family History  Problem Relation Age of Onset  . Breast cancer Cousin        mat cousin  . Hypertension Mother   . Heart attack Father   . Prostate cancer Father   . Diabetes Father   . Colon polyps Father     Current Outpatient Medications  Medication Sig Dispense Refill  . acetaminophen (TYLENOL) 500 MG tablet Take 500 mg by mouth every 6 (six) hours as needed (for pain/headaches.).    Marland Kitchen aspirin EC 81 MG tablet Take 81 mg by mouth daily.     Marland Kitchen atorvastatin (LIPITOR) 10 MG tablet Take 10 mg by mouth daily.     . Calcium Carbonate-Vitamin D 600-400 MG-UNIT tablet Take 1 tablet by mouth daily.     Marland Kitchen dexamethasone (DECADRON) 4 MG tablet Take 2 tablets (8 mg total) by mouth 2 (two) times daily. Start the day before Taxotere. Then again the day after chemo for 3 days. 30 tablet 1  . lidocaine-prilocaine (EMLA) cream Apply to affected area once 30 g 3  . lisinopril-hydrochlorothiazide (PRINZIDE,ZESTORETIC) 20-12.5 MG tablet Take 1 tablet by mouth daily.    . Multiple Vitamin (MULTIVITAMIN WITH MINERALS) TABS tablet Take 1 tablet by mouth daily after breakfast. Centrum  Silver    . ondansetron (ZOFRAN) 8 MG tablet Take 1 tablet (8 mg total) by mouth 2 (two) times daily as needed for refractory nausea / vomiting. Start on day 3 after chemo. 30 tablet 1  . prochlorperazine (COMPAZINE) 10 MG tablet Take 1 tablet (10 mg total) by mouth every 6 (six) hours as needed (Nausea or vomiting). 30 tablet 1   No current facility-administered medications for this visit.      PERFORMANCE STATUS (ECOG) : 0 - Asymptomatic  Review of Systems As noted above. Otherwise, a complete review of systems is negative.  Physical Exam Deferred   Assessment and Plan:    1. Cancer: Stage Ia invasive mammary carcinoma the left breast status post lumpectomy and sentinel lymph node biopsy being started on treatment with Docetaxel and Cyclophosphamide. Patient is followed by Dr. Janese Banks and has follow up appointment scheduled on  9/10 to start treatment. She has completed chemo education.   2. High Risk for ER/Hospitalization during Chemotherapy: We discussed the role of the chemo care clinic and identified patient specific risk factors. We also discussed the role of the Symptom Management and Palliative Care Clinics at [facility] and methods of contacting clinic/provider. She denies needing specific assistance at this time.  3. Social Determinants of Health:   Housing -  Lives at home alone  Food - No concerns  Transportation -drives herself.  Denies any transportation concerns  Utilities -denies any concerns  Safety -denies any concerns  Financial Strain -denies any concerns  Employment -patient is retired.  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   Family/Community Support - Son lives in [place], [place]. He has offered to come here to stay with patient but she does not yet think that is needed.   Physical Activity -patient is independent with all of her care.    Patient expressed understanding and was in agreement with this plan. She also understands that She can call clinic at any time with any questions, concerns, or complaints.   A total of (8) minutes of non-face-to-face time was spent with this patient with greater than 50% of that time in counseling and care-coordination.   Signed by: Altha Harm, PhD, DNP, NP-C, [facility] [phone] (Work Cell)

## 2019-08-01 NOTE — Telephone Encounter (Signed)
Patient informed of doctor response by leaving message on voice mail

## 2019-08-01 NOTE — Telephone Encounter (Signed)
Yes ok to take flu shot

## 2019-08-01 NOTE — Telephone Encounter (Signed)
P called reporting that she is to start chemotherapy this week and is asking if she should get the flu shot before she starts. Please advise

## 2019-08-02 ENCOUNTER — Telehealth: Payer: Self-pay | Admitting: *Deleted

## 2019-08-02 ENCOUNTER — Other Ambulatory Visit: Payer: Self-pay

## 2019-08-02 ENCOUNTER — Ambulatory Visit (INDEPENDENT_AMBULATORY_CARE_PROVIDER_SITE_OTHER): Payer: Medicare Other | Admitting: Surgery

## 2019-08-02 ENCOUNTER — Encounter: Payer: Self-pay | Admitting: Surgery

## 2019-08-02 ENCOUNTER — Encounter: Payer: Self-pay | Admitting: Oncology

## 2019-08-02 VITALS — BP 124/78 | HR 87 | Temp 97.7°F | Resp 16 | Ht 61.0 in | Wt 207.4 lb

## 2019-08-02 DIAGNOSIS — N390 Urinary tract infection, site not specified: Secondary | ICD-10-CM

## 2019-08-02 NOTE — Patient Instructions (Addendum)
Return in three months. Return ua done today

## 2019-08-02 NOTE — Progress Notes (Signed)
Pre-visit assessment prior to appointment on 08/03/2019 with Dr. Janese Banks complete. Pt was seen today for possible UTI and has UA and UCx pending. No additional medication prescribed at this time. Pt is concerned that illness will interfere with starting chemo. Otherwise, pt is feeling well and has no concerns.

## 2019-08-02 NOTE — Telephone Encounter (Signed)
Patient has an appointment with her PCP at 1 PM today

## 2019-08-02 NOTE — Telephone Encounter (Signed)
Patient informed to take medicine and o=proceed with chemotherapy as planned. Left message on voice mail

## 2019-08-02 NOTE — Telephone Encounter (Signed)
Patient called and reports that she saw her surgeon who ordered a [employer] urine testing and she won't hear back from it until tomorrow and she needs to know if she will still be able to get her chemotherapy tomorrow and if she needs to take her pre medicine today as scheduled if she gets chemotherapy. Please advise

## 2019-08-02 NOTE — Telephone Encounter (Signed)
Patient called reporting that she thinks she has a UTI and would like to know if she can get her urine tested for this. She states she is to start chemotherapy tomorrow and would like this taken care of before then. She states she has also called her other doctor and has an appointment with her surgeon today who may test her urine for her. Please advise

## 2019-08-02 NOTE — Telephone Encounter (Signed)
She can get chemo tomorrow.I am not sure why urine testign was ordered but will talk to he rtomorrow. She needs to take pre meds as prescribed

## 2019-08-03 ENCOUNTER — Inpatient Hospital Stay: Payer: Medicare Other

## 2019-08-03 ENCOUNTER — Inpatient Hospital Stay (HOSPITAL_BASED_OUTPATIENT_CLINIC_OR_DEPARTMENT_OTHER): Payer: Medicare Other | Admitting: Oncology

## 2019-08-03 ENCOUNTER — Telehealth: Payer: Self-pay

## 2019-08-03 ENCOUNTER — Other Ambulatory Visit: Payer: Self-pay | Admitting: *Deleted

## 2019-08-03 ENCOUNTER — Other Ambulatory Visit: Payer: Self-pay

## 2019-08-03 VITALS — BP 128/67 | HR 68 | Temp 97.6°F | Resp 18

## 2019-08-03 VITALS — BP 125/98 | HR 98 | Temp 98.0°F | Ht 61.0 in | Wt 208.0 lb

## 2019-08-03 DIAGNOSIS — C50412 Malignant neoplasm of upper-outer quadrant of left female breast: Secondary | ICD-10-CM | POA: Diagnosis not present

## 2019-08-03 DIAGNOSIS — Z170 Estrogen receptor positive status [ER+]: Secondary | ICD-10-CM

## 2019-08-03 DIAGNOSIS — Z5111 Encounter for antineoplastic chemotherapy: Secondary | ICD-10-CM | POA: Diagnosis not present

## 2019-08-03 DIAGNOSIS — E876 Hypokalemia: Secondary | ICD-10-CM | POA: Diagnosis not present

## 2019-08-03 LAB — CBC WITH DIFFERENTIAL/PLATELET
Abs Immature Granulocytes: 0.22 10*3/uL — ABNORMAL HIGH (ref 0.00–0.07)
Basophils Absolute: 0 10*3/uL (ref 0.0–0.1)
Basophils Relative: 0 %
Eosinophils Absolute: 0 10*3/uL (ref 0.0–0.5)
Eosinophils Relative: 0 %
HCT: 33.4 % — ABNORMAL LOW (ref 36.0–46.0)
Hemoglobin: 11.7 g/dL — ABNORMAL LOW (ref 12.0–15.0)
Immature Granulocytes: 1 %
Lymphocytes Relative: 6 %
Lymphs Abs: 1 10*3/uL (ref 0.7–4.0)
MCH: 29.9 pg (ref 26.0–34.0)
MCHC: 35 g/dL (ref 30.0–36.0)
MCV: 85.4 fL (ref 80.0–100.0)
Monocytes Absolute: 0.3 10*3/uL (ref 0.1–1.0)
Monocytes Relative: 2 %
Neutro Abs: 14.2 10*3/uL — ABNORMAL HIGH (ref 1.7–7.7)
Neutrophils Relative %: 91 %
Platelets: 361 10*3/uL (ref 150–400)
RBC: 3.91 MIL/uL (ref 3.87–5.11)
RDW: 12.9 % (ref 11.5–15.5)
WBC: 15.7 10*3/uL — ABNORMAL HIGH (ref 4.0–10.5)
nRBC: 0 % (ref 0.0–0.2)

## 2019-08-03 LAB — COMPREHENSIVE METABOLIC PANEL
ALT: 30 U/L (ref 0–44)
AST: 33 U/L (ref 15–41)
Albumin: 4 g/dL (ref 3.5–5.0)
Alkaline Phosphatase: 80 U/L (ref 38–126)
Anion gap: 15 (ref 5–15)
BUN: 17 mg/dL (ref 8–23)
CO2: 20 mmol/L — ABNORMAL LOW (ref 22–32)
Calcium: 9.2 mg/dL (ref 8.9–10.3)
Chloride: 100 mmol/L (ref 98–111)
Creatinine, Ser: 1.11 mg/dL — ABNORMAL HIGH (ref 0.44–1.00)
GFR calc Af Amer: 58 mL/min — ABNORMAL LOW (ref >60)
GFR calc non Af Amer: 50 mL/min — ABNORMAL LOW (ref >60)
Glucose, Bld: 323 mg/dL — ABNORMAL HIGH (ref 70–99)
Potassium: 3.3 mmol/L — ABNORMAL LOW (ref 3.5–5.1)
Sodium: 135 mmol/L (ref 135–145)
Total Bilirubin: 0.4 mg/dL (ref 0.3–1.2)
Total Protein: 6.9 g/dL (ref 6.5–8.1)

## 2019-08-03 MED ORDER — HEPARIN SOD (PORK) LOCK FLUSH 100 UNIT/ML IV SOLN
500.0000 [IU] | Freq: Once | INTRAVENOUS | Status: AC
  Administered 2019-08-03: 500 [IU] via INTRAVENOUS
  Filled 2019-08-03: qty 5

## 2019-08-03 MED ORDER — PEGFILGRASTIM 6 MG/0.6ML ~~LOC~~ PSKT
6.0000 mg | PREFILLED_SYRINGE | Freq: Once | SUBCUTANEOUS | Status: AC
  Administered 2019-08-03: 14:00:00 6 mg via SUBCUTANEOUS
  Filled 2019-08-03: qty 0.6

## 2019-08-03 MED ORDER — SODIUM CHLORIDE 0.9 % IV SOLN
600.0000 mg/m2 | Freq: Once | INTRAVENOUS | Status: AC
  Administered 2019-08-03: 1200 mg via INTRAVENOUS
  Filled 2019-08-03: qty 50

## 2019-08-03 MED ORDER — SODIUM CHLORIDE 0.9 % IV SOLN
75.0000 mg/m2 | Freq: Once | INTRAVENOUS | Status: AC
  Administered 2019-08-03: 150 mg via INTRAVENOUS
  Filled 2019-08-03: qty 15

## 2019-08-03 MED ORDER — SODIUM CHLORIDE 0.9 % IV SOLN
Freq: Once | INTRAVENOUS | Status: AC
  Administered 2019-08-03: 11:00:00 via INTRAVENOUS
  Filled 2019-08-03: qty 250

## 2019-08-03 MED ORDER — SODIUM CHLORIDE 0.9 % IV SOLN: 10.0000 mg | Freq: Once | INTRAVENOUS | Status: DC

## 2019-08-03 MED ORDER — DEXAMETHASONE SODIUM PHOSPHATE 10 MG/ML IJ SOLN
10.0000 mg | Freq: Once | INTRAMUSCULAR | Status: AC
  Administered 2019-08-03: 10 mg via INTRAVENOUS
  Filled 2019-08-03: qty 1

## 2019-08-03 MED ORDER — CIPROFLOXACIN HCL 500 MG PO TABS: 500.0000 mg | ORAL_TABLET | Freq: Two times a day (BID) | ORAL | 0 refills | Status: DC

## 2019-08-03 MED ORDER — SODIUM CHLORIDE 0.9% FLUSH
10.0000 mL | INTRAVENOUS | Status: DC | PRN
  Administered 2019-08-03: 10 mL via INTRAVENOUS
  Filled 2019-08-03: qty 10

## 2019-08-03 MED ORDER — AMOXICILLIN-POT CLAVULANATE 875-125 MG PO TABS: 1.0000 | ORAL_TABLET | Freq: Two times a day (BID) | ORAL | 0 refills | Status: DC

## 2019-08-03 MED ORDER — AMOXICILLIN-POT CLAVULANATE 875-125 MG PO TABS: 1.0000 | ORAL_TABLET | Freq: Two times a day (BID) | ORAL | 0 refills | Status: AC

## 2019-08-03 MED ORDER — POTASSIUM CHLORIDE CRYS ER 20 MEQ PO TBCR: 20.0000 meq | EXTENDED_RELEASE_TABLET | Freq: Every day | ORAL | 0 refills | Status: DC

## 2019-08-03 MED ORDER — PALONOSETRON HCL INJECTION 0.25 MG/5ML
0.2500 mg | Freq: Once | INTRAVENOUS | Status: AC
  Administered 2019-08-03: 0.25 mg via INTRAVENOUS
  Filled 2019-08-03: qty 5

## 2019-08-03 NOTE — Progress Notes (Signed)
Pt tolerated infusion well. Pt and VS stable at discharge.  

## 2019-08-03 NOTE — Telephone Encounter (Signed)
she has a UTI,  Called  Augmenting 875 mg PO BID x 5 days

## 2019-08-03 NOTE — Telephone Encounter (Signed)
Left message -pick up antibiotic at pharmacy-positive for UTI.

## 2019-08-04 ENCOUNTER — Telehealth: Payer: Self-pay | Admitting: *Deleted

## 2019-08-04 LAB — URINALYSIS
Bilirubin, UA: NEGATIVE
Glucose, UA: NEGATIVE
Ketones, UA: NEGATIVE
Leukocytes,UA: NEGATIVE
Nitrite, UA: POSITIVE — AB
Protein,UA: NEGATIVE
RBC, UA: NEGATIVE
Specific Gravity, UA: 1.017 (ref 1.005–1.030)
Urobilinogen, Ur: 1 mg/dL (ref 0.2–1.0)
pH, UA: 5.5 (ref 5.0–7.5)

## 2019-08-04 LAB — URINE CULTURE

## 2019-08-04 NOTE — Progress Notes (Signed)
Hematology/Oncology Consult note [facility]  Telephone:([phone] [phone]  Patient Care Team: Ezequiel Kayser, MD as PCP - General (Internal Medicine)   Name of the patient: Amanda Wade  [id number]  1951/10/18   Date of visit: 08/04/19  Diagnosis- pathological prognostic stage IA [id number] mammary carcinoma of the left breast status post lumpectomy and sentinel lymph node biopsy   Chief complaint/ Reason for visit-on treatment assessment prior to cycle 1 of adjuvant TC chemotherapy  Heme/Onc history: Patient is a 68 year old postmenopausal female with a past medical history significant for hypertension hyperlipidemia among other medical problems. She recently underwent a screening mammogram on 04/28/2019 which showed possible mass in the left breast. This was followed by diagnostic mammogram and ultrasound which showed bilobar hypo-Echoic mass at the 2:30 position of the left breast measuring 0.5 x 0.6 x 1.1 cm. Hypoechoic mass at the 2 o'clock position as well. And another bilobed irregular hypoechoic mass at the 3 o'clock position measuring 4 x 5 x 6 mm. Patient had biopsy of all these 3 masses. The 3 o'clock position breast mass came back as invasive mammary carcinoma 6 mm, grade 1 strongly ER PR positive greater than 90% and HER-2/neu negative. 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 230 breast mass also came back as intermediate grade DCIS. There was also another mass noted in the left breast which was biopsied and was 5 mm grade 1 ER PR positive and HER-2/neu negative.  Patient was taken for lumpectomy and sentinel lymph node biopsy by Dr. Bary Castilla on 06/05/2019.Final pathology showed invasive mammary carcinoma grade 1 ER PR positive and HER-2/neu negative at 4 different biopsy sites with extensive DCIS predominantly micropapillary type. One lymph node was also involved with macro metastatic carcinoma and was  negative for extranodal extension  Patient's case was discussed at tumor board and there was a concern that there may be potentially more areas of abnormality given extensive DCIS and invasive cancer seen in multiple foci in the lumpectomy specimen. Plan was to proceed with bilateral MRI.Breast MRI showed a large area of clumped discontinued non-mass enhancement in the left upper breast spanning an area of 9.8 cm in AP dimension suspicious for residual DCIS or invasive carcinoma.  Patient therefore proceeded with r left mastectomy which showed small area of residual DCIS but no evidence of invasive malignancy.  Patient also had a MammaPrint testing done on her lumpectomy specimen which came back as high risk group. 29% chance of recurrence without chemotherapy at 5 years. Greater than 12% chemo benefit and estimated >93% survival at 5 years with chemotherapy and hormone therapy  Interval history-patient reported some symptoms of pressure during urinating.  Denies any fever dysuria or burning.  She reports taking over-the-counter Azo for 1 dose and after that her symptoms resolved.  She did have a urinalysis done which did not show any evidence of leukocyte esterase but was positive for nitrite.  Urine culture was pending at the time of my visit.  ECOG PS- 1 Pain scale- 0   Review of systems- Review of Systems  Constitutional: Negative for chills, fever, malaise/fatigue and weight loss.  HENT: Negative for congestion, ear discharge and nosebleeds.   Eyes: Negative for blurred vision.  Respiratory: Negative for cough, hemoptysis, sputum production, shortness of breath and wheezing.   Cardiovascular: Negative for chest pain, palpitations, orthopnea and claudication.  Gastrointestinal: Negative for abdominal pain, blood in stool, constipation, diarrhea, heartburn, melena, nausea and vomiting.  Genitourinary:  Negative for dysuria, flank pain, frequency, hematuria and urgency.  Musculoskeletal:  Negative for back pain, joint pain and myalgias.  Skin: Negative for rash.  Neurological: Negative for dizziness, tingling, focal weakness, seizures, weakness and headaches.  Endo/Heme/Allergies: Does not bruise/bleed easily.  Psychiatric/Behavioral: Negative for depression and suicidal ideas. The patient does not have insomnia.       Allergies  Allergen Reactions   Shellfish Allergy Rash   Sulfa Antibiotics Rash     Past Medical History:  Diagnosis Date   Complication of anesthesia    Hyperlipidemia    Hypertension    Malignant neoplasm of upper-outer quadrant of left breast in female, estrogen receptor positive ([place]) 05/19/2019   PONV (postoperative nausea and vomiting)    Vertigo    intermittent especially lying on left side     Past Surgical History:  Procedure Laterality Date   ABDOMINAL HYSTERECTOMY  2004   complete   BREAST BIOPSY Left 05/05/2019   [facility]- ribbon clip   BREAST BIOPSY Left 05/16/2019   [facility]-DCIS   BREAST BIOPSY Left 05/16/2019   [facility]- X clip   BREAST BIOPSY WITH SENTINEL LYMPH NODE BIOPSY AND NEEDLE LOCALIZATION Left 06/05/2019   Procedure: BREAST BIOPSY WITH SENTINEL LYMPH NODE BIOPSY AND NEEDLE LOCALIZATION, LEFT, BRACKETING WIRE;  Surgeon: Robert Bellow, MD;  Location: [facility] ORS;  Service: General;  Laterality: Left;   BREAST CYST ASPIRATION Bilateral age 63   neg   CHOLECYSTECTOMY     COLONOSCOPY WITH PROPOFOL N/A 08/02/2018   Procedure: COLONOSCOPY WITH PROPOFOL;  Surgeon: [name], MD;  Location: [facility] ENDOSCOPY;  Service: Endoscopy;  Laterality: N/A;   DILATION AND CURETTAGE OF UTERUS     goiter surgery     OOPHORECTOMY     PORTACATH PLACEMENT Left 07/10/2019   Procedure: INSERTION PORT-A-CATH;  Surgeon: Jules Husbands, MD;  Location: [facility] ORS;  Service: General;  Laterality: Left;   SIMPLE MASTECTOMY WITH AXILLARY SENTINEL NODE BIOPSY Left 07/10/2019   Procedure: SIMPLE MASTECTOMY.; LEFT;  Surgeon: Jules Husbands,  MD;  Location: [facility] ORS;  Service: General;  Laterality: Left;   THYROIDECTOMY, PARTIAL      Social History   Socioeconomic History   Marital status: Divorced    Spouse name: Not on file   Number of children: Not on file   Years of education: Not on file   Highest education level: Not on file  Occupational History   Not on file  Social Needs   Financial resource strain: Not on file   Food insecurity    Worry: Not on file    Inability: Not on file   Transportation needs    Medical: Not on file    Non-medical: Not on file  Tobacco Use   Smoking status: Never Smoker   Smokeless tobacco: Never Used  Substance and Sexual Activity   Alcohol use: No   Drug use: No   Sexual activity: Not Currently  Lifestyle   Physical activity    Days per week: Not on file    Minutes per session: Not on file   Stress: Not on file  Relationships   Social connections    Talks on phone: Not on file    Gets together: Not on file    Attends religious service: Not on file    Active member of club or organization: Not on file    Attends meetings of clubs or organizations: Not on file    Relationship status: Not on file  Intimate partner violence    Fear of current or ex partner: Not on file    Emotionally abused: Not on file    Physically abused: Not on file    Forced sexual activity: Not on file  Other Topics Concern   Not on file  Social History Narrative   Not on file    Family History  Problem Relation Age of Onset   Breast cancer Cousin        mat cousin   Hypertension Mother    Heart attack Father    Prostate cancer Father    Diabetes Father    Colon polyps Father      Current Outpatient Medications:    acetaminophen (TYLENOL) 500 MG tablet, Take 500 mg by mouth every 6 (six) hours as needed (for pain/headaches.)., Disp: , Rfl:    aspirin EC 81 MG tablet, Take 81 mg by mouth daily., Disp: , Rfl:    atorvastatin (LIPITOR) 10 MG tablet, Take 10  mg by mouth daily. , Disp: , Rfl:    Calcium Carbonate-Vitamin D 600-400 MG-UNIT tablet, Take 1 tablet by mouth daily. , Disp: , Rfl:    dexamethasone (DECADRON) 4 MG tablet, Take 2 tablets (8 mg total) by mouth 2 (two) times daily. Start the day before Taxotere. Then again the day after chemo for 3 days., Disp: 30 tablet, Rfl: 1   lidocaine-prilocaine (EMLA) cream, Apply to affected area once, Disp: 30 g, Rfl: 3   lisinopril-hydrochlorothiazide (PRINZIDE,ZESTORETIC) 20-12.5 MG tablet, Take 1 tablet by mouth daily., Disp: , Rfl:    Multiple Vitamin (MULTIVITAMIN WITH MINERALS) TABS tablet, Take 1 tablet by mouth daily after breakfast. Centrum Silver, Disp: , Rfl:    ondansetron (ZOFRAN) 8 MG tablet, Take 1 tablet (8 mg total) by mouth 2 (two) times daily as needed for refractory nausea / vomiting. Start on day 3 after chemo., Disp: 30 tablet, Rfl: 1   prochlorperazine (COMPAZINE) 10 MG tablet, Take 1 tablet (10 mg total) by mouth every 6 (six) hours as needed (Nausea or vomiting)., Disp: 30 tablet, Rfl: 1   amoxicillin-clavulanate (AUGMENTIN) 875-125 MG tablet, Take 1 tablet by mouth 2 (two) times daily for 5 days., Disp: 10 tablet, Rfl: 0   amoxicillin-clavulanate (AUGMENTIN) 875-125 MG tablet, Take 1 tablet by mouth 2 (two) times daily for 5 days., Disp: 10 tablet, Rfl: 0   ciprofloxacin (CIPRO) 500 MG tablet, Take 1 tablet (500 mg total) by mouth 2 (two) times daily., Disp: 14 tablet, Rfl: 0   potassium chloride SA (K-DUR) 20 MEQ tablet, Take 1 tablet (20 mEq total) by mouth daily., Disp: 10 tablet, Rfl: 0  Physical exam:  Vitals:   08/03/19 1020 08/03/19 1023  BP: (!) 125/98   Pulse: 98   Temp: 98 F (36.7 C)   TempSrc: Tympanic   Weight:  208 lb (94.3 kg)  Height:  _0  (1.549 m)   Physical Exam Constitutional:      General: She is not in acute distress. HENT:     Head: Normocephalic and atraumatic.  Eyes:     Pupils: Pupils are equal, round, and reactive to light.    Neck:     Musculoskeletal: Normal range of motion.  Cardiovascular:     Rate and Rhythm: Normal rate and regular rhythm.     Heart sounds: Normal heart sounds.  Pulmonary:     Effort: Pulmonary effort is normal.     Breath sounds: Normal breath sounds.  Abdominal:  General: Bowel sounds are normal.     Palpations: Abdomen is soft.  Skin:    General: Skin is warm and dry.  Neurological:     Mental Status: She is alert and oriented to person, place, and time.   Patient is status post left mastectomy without reconstruction.  Wound is well-healing  CMP Latest Ref Rng & Units 08/03/2019  Glucose 70 - 99 mg/dL 323(H)  BUN 8 - 23 mg/dL 17  Creatinine 0.44 - 1.00 mg/dL 1.11(H)  Sodium 135 - 145 mmol/L 135  Potassium 3.5 - 5.1 mmol/L 3.3(L)  Chloride 98 - 111 mmol/L 100  CO2 22 - 32 mmol/L 20(L)  Calcium 8.9 - 10.3 mg/dL 9.2  Total Protein 6.5 - 8.1 g/dL 6.9  Total Bilirubin 0.3 - 1.2 mg/dL 0.4  Alkaline Phos 38 - 126 U/L 80  AST 15 - 41 U/L 33  ALT 0 - 44 U/L 30   CBC Latest Ref Rng & Units 08/03/2019  WBC 4.0 - 10.5 K/uL 15.7(H)  Hemoglobin 12.0 - 15.0 g/dL 11.7(L)  Hematocrit 36.0 - 46.0 % 33.4(L)  Platelets 150 - 400 K/uL 361    No images are attached to the encounter.  Dg Chest Port 1 View  Addendum Date: 07/10/2019   ADDENDUM REPORT: 07/10/2019 13:21 ADDENDUM: The report was inadvertently signed before the repeat chest radiograph to include the entire Port-A-Cath was performed. A RIGHT IJ Port-A-Cath is noted with tip overlying the mid SVC. Cardiomediastinal silhouette is unremarkable. Mild pulmonary vascular congestion is noted. There is no evidence of pneumothorax or acute bony abnormality. IMPRESSION: RIGHT IJ Port-A-Cath with tip overlying the mid SVC. No pneumothorax. Mild pulmonary vascular congestion. Electronically Signed   By: Margarette Canada M.D.   On: 07/10/2019 13:21   Result Date: 07/10/2019 CLINICAL DATA:  Port-A-Cath placement. EXAM: PORTABLE CHEST 1 VIEW  COMPARISON:  None. FINDINGS: The heart size and mediastinal contours are within normal limits. Both lungs are clear. The visualized skeletal structures are unremarkable. IMPRESSION: No active disease. Electronically Signed: By: Margarette Canada M.D. On: 07/10/2019 12:42   Dg C-arm 1-60 Min-no Report  Result Date: 07/10/2019 Fluoroscopy was utilized by the requesting physician.  No radiographic interpretation.     Assessment and plan- Patient is a 68 y.o. female with invasive mammary carcinoma of the left breast pathological prognostic stage I [id number] ER PR positive HER-2/neu negative status post lumpectomy and sentinel lymph node biopsy.She is high risk mammprint score and s/p mastectomy given concerns for residual disease.  She is here for on treatment assessment prior to cycle 1 of TC chemotherapy  Counts okay to proceed with cycle 1 of TC chemotherapy adjuvant with ongoing Neulasta support today.  I will see her back in 10 days to see how she tolerated her chemotherapy.  Plan is to give her 4 cycles.  Patient was complaining of some symptoms of pressure during urination which have resolved over the last 1 day.  Her urinalysis was positive for nitrate but was otherwise negative.  Urine culture was pending at the time of my visit.  I have given her an empiric antibiotic course for ciprofloxacin which she can start taking over the weekend if her symptoms recur.  Hypokalemia: We will give her 20 mEq of potassium oral for the next 10 days.   Visit Diagnosis 1. Malignant neoplasm of upper-outer quadrant of left breast in female, estrogen receptor positive ([place])   2. Encounter for antineoplastic chemotherapy   3. Hypokalemia  Dr. Randa Evens, MD, MPH [facility] at [facility] [phone] 08/04/2019 1:41 PM

## 2019-08-04 NOTE — Telephone Encounter (Signed)
Patient advised to go ahead and take the Augmentin, She stated she will get it filled

## 2019-08-04 NOTE — Telephone Encounter (Signed)
Ok she can take augmentin

## 2019-08-04 NOTE — Telephone Encounter (Signed)
Patient called stating the pharmacist told her that the Cipro ordered by Dr Janese Banks was going to be harsh on her body due to her age and that the Augmentin ordered by surgeon would be better for her. There is an interaction with Cipro and theLipitor she takes . She is confused about what to take. She would like clarification on this matter. Please advise

## 2019-08-05 NOTE — Progress Notes (Signed)
S/p mastectomy Doing very well Main issue is urinary frequency,  No fevers or chills  PE NAD Mastectomy Wound healing well, no infection, flaps viable no seromas. Abd: soft, nt,  Ext: well perfused, no lymphedema  A/P Doing well UTI not related to surgery, obtain U/S and start empiric rx w Augmentin k to start chemo next week No surgical issues I spent 25 minutes in this encounter w > 50% spent in corrdination and counseling of her care

## 2019-08-07 ENCOUNTER — Telehealth: Payer: Self-pay

## 2019-08-07 NOTE — Telephone Encounter (Signed)
Telephone call to patient for follow up after first chemo.  Patient states was "achy" last night and feels it was because of the Neulasta injection she received on Friday.   Patient states she took Claritin 4 days straight starting the day of chemo.  States other than "achy" she is feeling well.   Encouraged patient to call for any questions or concerns.  Patient thanked me for the call.  States eating and drinking fluids well.

## 2019-08-08 ENCOUNTER — Telehealth: Payer: Self-pay | Admitting: *Deleted

## 2019-08-08 ENCOUNTER — Other Ambulatory Visit: Payer: Self-pay

## 2019-08-08 ENCOUNTER — Inpatient Hospital Stay (HOSPITAL_BASED_OUTPATIENT_CLINIC_OR_DEPARTMENT_OTHER): Payer: Medicare Other | Admitting: Nurse Practitioner

## 2019-08-08 DIAGNOSIS — R110 Nausea: Secondary | ICD-10-CM | POA: Diagnosis not present

## 2019-08-08 DIAGNOSIS — R509 Fever, unspecified: Secondary | ICD-10-CM | POA: Diagnosis not present

## 2019-08-08 DIAGNOSIS — Z170 Estrogen receptor positive status [ER+]: Secondary | ICD-10-CM | POA: Diagnosis not present

## 2019-08-08 DIAGNOSIS — C50412 Malignant neoplasm of upper-outer quadrant of left female breast: Secondary | ICD-10-CM

## 2019-08-08 NOTE — Telephone Encounter (Signed)
Patient called back reporting that her nausea is a little better and now she has a temp of 100.0. States she feels really bad, weak unable to eat. Please advise

## 2019-08-08 NOTE — Telephone Encounter (Signed)
Patient called reporting that she has developed nausea this morning and she has taken compazine at 6 AM and then a Zofran at 830. She continues to have nausea and is asking if she can take another pill now. I told her to wait until noon to take another compazine and if she is not better by 130 to call me back and I would try to get her seen in [facility]

## 2019-08-08 NOTE — Progress Notes (Signed)
Virtual Visit Progress Note  Symptom Management [place]  Telephone:([phone] [phone]  I connected with Amanda Wade on 08/08/19 at  3:00 PM EDT by video enabled telemedicine visit and verified that I am speaking with the correct person using two identifiers.   I discussed the limitations, risks, security and privacy concerns of performing an evaluation and management service by telemedicine and the availability of in-person appointments. I also discussed with the patient that there may be a patient responsible charge related to this service. The patient expressed understanding and agreed to proceed.   Other persons participating in the visit and their role in the encounter: none  Patient's location: home Provider's location: clinic  Chief Complaint: malaise, nausea, low grade fever    Patient Care Team: Ezequiel Kayser, MD as PCP - General (Internal Medicine)   Name of the patient: Amanda Wade  [id number]  04/16/1951   Date of visit: 08/08/19  Diagnosis-breast cancer  Chief complaint/ Reason for visit-low-grade fever, nausea  Heme/Onc history:  Oncology History  Malignant neoplasm of upper-outer quadrant of left breast in female, estrogen receptor positive ([place])  05/18/2019 Cancer Staging   Staging form: Breast, AJCC 8th Edition - Clinical stage from 05/18/2019: Stage IA (cT1c, cN0, cM0, G1, ER+, PR+, HER2-) - Signed by Sindy Guadeloupe, MD on 05/21/2019   05/19/2019 Initial Diagnosis   Malignant neoplasm of upper-outer quadrant of left breast in female, estrogen receptor positive ([place])   08/03/2019 -  Chemotherapy   The patient had palonosetron (ALOXI) injection 0.25 mg, 0.25 mg, Intravenous,  Once, 1 of 4 cycles Administration: 0.25 mg (08/03/2019) pegfilgrastim (NEULASTA ONPRO KIT) injection 6 mg, 6 mg, Subcutaneous, Once, 1 of 4 cycles Administration: 6 mg (08/03/2019) cyclophosphamide (CYTOXAN) 1,200 mg in sodium chloride 0.9  % 250 mL chemo infusion, 600 mg/m2 = 1,200 mg, Intravenous,  Once, 1 of 4 cycles Administration: 1,200 mg (08/03/2019) DOCEtaxel (TAXOTERE) 150 mg in sodium chloride 0.9 % 250 mL chemo infusion, 75 mg/m2 = 150 mg, Intravenous,  Once, 1 of 4 cycles Administration: 150 mg (08/03/2019)  for chemotherapy treatment.      Interval history- Amanda Wade, 68 year old female, diagnosed with stage Ia left breast cancer, currently s/p cycle 1 of adjuvant TC chemotherapy, presents to symptom management clinic for low-grade fever and nausea.  Symptoms started this morning and have persisted since that time.  She complains of nausea, no vomiting but did have episode of dry heaves, abdominal discomfort.  She took Zofran and Compazine alternating today.  Tolerating sips of water.  Has eaten some crackers.  Says she just "feels bad".  She continues Augmentin for UTI with last dose tomorrow.  Last dose of Decadron was yesterday.  She has not had Tylenol.  T-max was 100.1.  Says she is worried about living alone and feeling bad.  She denies sick contacts.  No dizziness or syncope.  No urinary complaints.  ECOG FS:1 - Symptomatic but completely ambulatory  Review of systems- Review of Systems  Constitutional: Positive for fever and malaise/fatigue. Negative for chills and weight loss.  HENT: Negative for congestion, hearing loss, nosebleeds, sore throat and tinnitus.   Eyes: Negative for blurred vision and double vision.  Respiratory: Negative for cough, hemoptysis, shortness of breath and wheezing.   Cardiovascular: Negative for chest pain, palpitations and leg swelling.  Gastrointestinal: Positive for abdominal pain and nausea. Negative for blood in stool, constipation, diarrhea, melena and vomiting.  Genitourinary: Negative for dysuria  and urgency.  Musculoskeletal: Negative for back pain, falls, joint pain and myalgias.  Skin: Negative for itching and rash.  Neurological: Negative for dizziness, tingling,  sensory change, loss of consciousness, weakness and headaches.  Endo/Heme/Allergies: Negative for environmental allergies. Does not bruise/bleed easily.  Psychiatric/Behavioral: Negative for depression. The patient is not nervous/anxious and does not have insomnia.      Current treatment-Taxotere and Cytoxan on 08/03/2019  Allergies  Allergen Reactions  . Shellfish Allergy Rash  . Sulfa Antibiotics Rash    Past Medical History:  Diagnosis Date  . Complication of anesthesia   . Hyperlipidemia   . Hypertension   . Malignant neoplasm of upper-outer quadrant of left breast in female, estrogen receptor positive ([place]) 05/19/2019  . PONV (postoperative nausea and vomiting)   . Vertigo    intermittent especially lying on left side    Past Surgical History:  Procedure Laterality Date  . ABDOMINAL HYSTERECTOMY  2004   complete  . BREAST BIOPSY Left 05/05/2019   [facility]- ribbon clip  . BREAST BIOPSY Left 05/16/2019   [facility]-DCIS  . BREAST BIOPSY Left 05/16/2019   [facility]- X clip  . BREAST BIOPSY WITH SENTINEL LYMPH NODE BIOPSY AND NEEDLE LOCALIZATION Left 06/05/2019   Procedure: BREAST BIOPSY WITH SENTINEL LYMPH NODE BIOPSY AND NEEDLE LOCALIZATION, LEFT, BRACKETING WIRE;  Surgeon: Robert Bellow, MD;  Location: [facility] ORS;  Service: General;  Laterality: Left;  . BREAST CYST ASPIRATION Bilateral age 51   neg  . CHOLECYSTECTOMY    . COLONOSCOPY WITH PROPOFOL N/A 08/02/2018   Procedure: COLONOSCOPY WITH PROPOFOL;  Surgeon: [name], MD;  Location: [facility] ENDOSCOPY;  Service: Endoscopy;  Laterality: N/A;  . DILATION AND CURETTAGE OF UTERUS    . goiter surgery    . OOPHORECTOMY    . PORTACATH PLACEMENT Left 07/10/2019   Procedure: INSERTION PORT-A-CATH;  Surgeon: Jules Husbands, MD;  Location: [facility] ORS;  Service: General;  Laterality: Left;  . SIMPLE MASTECTOMY WITH AXILLARY SENTINEL NODE BIOPSY Left 07/10/2019   Procedure: SIMPLE MASTECTOMY.; LEFT;  Surgeon: Jules Husbands, MD;  Location:  [facility] ORS;  Service: General;  Laterality: Left;  . THYROIDECTOMY, PARTIAL      Social History   Socioeconomic History  . Marital status: Divorced    Spouse name: Not on file  . Number of children: Not on file  . Years of education: Not on file  . Highest education level: Not on file  Occupational History  . Not on file  Social Needs  . Financial resource strain: Not on file  . Food insecurity    Worry: Not on file    Inability: Not on file  . Transportation needs    Medical: Not on file    Non-medical: Not on file  Tobacco Use  . Smoking status: Never Smoker  . Smokeless tobacco: Never Used  Substance and Sexual Activity  . Alcohol use: No  . Drug use: No  . Sexual activity: Not Currently  Lifestyle  . Physical activity    Days per week: Not on file    Minutes per session: Not on file  . Stress: Not on file  Relationships  . Social Herbalist on phone: Not on file    Gets together: Not on file    Attends religious service: Not on file    Active member of club or organization: Not on file    Attends meetings of clubs or organizations: Not on file  Relationship status: Not on file  . Intimate partner violence    Fear of current or ex partner: Not on file    Emotionally abused: Not on file    Physically abused: Not on file    Forced sexual activity: Not on file  Other Topics Concern  . Not on file  Social History Narrative  . Not on file    Family History  Problem Relation Age of Onset  . Breast cancer Cousin        mat cousin  . Hypertension Mother   . Heart attack Father   . Prostate cancer Father   . Diabetes Father   . Colon polyps Father      Current Outpatient Medications:  .  acetaminophen (TYLENOL) 500 MG tablet, Take 500 mg by mouth every 6 (six) hours as needed (for pain/headaches.)., Disp: , Rfl:  .  amoxicillin-clavulanate (AUGMENTIN) 875-125 MG tablet, Take 1 tablet by mouth 2 (two) times daily for 5 days., Disp: 10 tablet, Rfl:  0 .  amoxicillin-clavulanate (AUGMENTIN) 875-125 MG tablet, Take 1 tablet by mouth 2 (two) times daily for 5 days., Disp: 10 tablet, Rfl: 0 .  aspirin EC 81 MG tablet, Take 81 mg by mouth daily., Disp: , Rfl:  .  atorvastatin (LIPITOR) 10 MG tablet, Take 10 mg by mouth daily. , Disp: , Rfl:  .  Calcium Carbonate-Vitamin D 600-400 MG-UNIT tablet, Take 1 tablet by mouth daily. , Disp: , Rfl:  .  ciprofloxacin (CIPRO) 500 MG tablet, Take 1 tablet (500 mg total) by mouth 2 (two) times daily., Disp: 14 tablet, Rfl: 0 .  dexamethasone (DECADRON) 4 MG tablet, Take 2 tablets (8 mg total) by mouth 2 (two) times daily. Start the day before Taxotere. Then again the day after chemo for 3 days., Disp: 30 tablet, Rfl: 1 .  lidocaine-prilocaine (EMLA) cream, Apply to affected area once, Disp: 30 g, Rfl: 3 .  lisinopril-hydrochlorothiazide (PRINZIDE,ZESTORETIC) 20-12.5 MG tablet, Take 1 tablet by mouth daily., Disp: , Rfl:  .  Multiple Vitamin (MULTIVITAMIN WITH MINERALS) TABS tablet, Take 1 tablet by mouth daily after breakfast. Centrum Silver, Disp: , Rfl:  .  ondansetron (ZOFRAN) 8 MG tablet, Take 1 tablet (8 mg total) by mouth 2 (two) times daily as needed for refractory nausea / vomiting. Start on day 3 after chemo., Disp: 30 tablet, Rfl: 1 .  potassium chloride SA (K-DUR) 20 MEQ tablet, Take 1 tablet (20 mEq total) by mouth daily., Disp: 10 tablet, Rfl: 0 .  prochlorperazine (COMPAZINE) 10 MG tablet, Take 1 tablet (10 mg total) by mouth every 6 (six) hours as needed (Nausea or vomiting)., Disp: 30 tablet, Rfl: 1  Physical exam: Exam limited due to telemedicine  Physical Exam Constitutional:      General: She is not in acute distress.    Comments: Fatigued   Eyes:     General: No scleral icterus.    Conjunctiva/sclera: Conjunctivae normal.  Skin:    General: Skin is dry.  Neurological:     Mental Status: She is alert and oriented to person, place, and time.  Psychiatric:        Mood and Affect: Mood  is anxious.      CMP Latest Ref Rng & Units 08/03/2019  Glucose 70 - 99 mg/dL 323(H)  BUN 8 - 23 mg/dL 17  Creatinine 0.44 - 1.00 mg/dL 1.11(H)  Sodium 135 - 145 mmol/L 135  Potassium 3.5 - 5.1 mmol/L 3.3(L)  Chloride 98 - 111  mmol/L 100  CO2 22 - 32 mmol/L 20(L)  Calcium 8.9 - 10.3 mg/dL 9.2  Total Protein 6.5 - 8.1 g/dL 6.9  Total Bilirubin 0.3 - 1.2 mg/dL 0.4  Alkaline Phos 38 - 126 U/L 80  AST 15 - 41 U/L 33  ALT 0 - 44 U/L 30   CBC Latest Ref Rng & Units 08/03/2019  WBC 4.0 - 10.5 K/uL 15.7(H)  Hemoglobin 12.0 - 15.0 g/dL 11.7(L)  Hematocrit 36.0 - 46.0 % 33.4(L)  Platelets 150 - 400 K/uL 361    No images are attached to the encounter.  Dg Chest Port 1 View  Addendum Date: 07/10/2019   ADDENDUM REPORT: 07/10/2019 13:21 ADDENDUM: The report was inadvertently signed before the repeat chest radiograph to include the entire Port-A-Cath was performed. A RIGHT IJ Port-A-Cath is noted with tip overlying the mid SVC. Cardiomediastinal silhouette is unremarkable. Mild pulmonary vascular congestion is noted. There is no evidence of pneumothorax or acute bony abnormality. IMPRESSION: RIGHT IJ Port-A-Cath with tip overlying the mid SVC. No pneumothorax. Mild pulmonary vascular congestion. Electronically Signed   By: Margarette Canada M.D.   On: 07/10/2019 13:21   Result Date: 07/10/2019 CLINICAL DATA:  Port-A-Cath placement. EXAM: PORTABLE CHEST 1 VIEW COMPARISON:  None. FINDINGS: The heart size and mediastinal contours are within normal limits. Both lungs are clear. The visualized skeletal structures are unremarkable. IMPRESSION: No active disease. Electronically Signed: By: Margarette Canada M.D. On: 07/10/2019 12:42   Dg C-arm 1-60 Min-no Report  Result Date: 07/10/2019 Fluoroscopy was utilized by the requesting physician.  No radiographic interpretation.    Assessment and plan- Patient is a 68 y.o. female diagnosed with breast cancer, recently started Taxotere Cytoxan chemotherapy on 08/03/2019  who presents to symptom management clinic for complaints of low-grade fever and nausea, on augmentin. Unclear etiology of symptoms but will get a covid test. In the interim, start tylenol, continue zofran and compazine. Encouraged sipping oral fluids such as ginger ale or sprite to stay hydrated, eating bland foods if tolerating fluids, and resting. If symptoms worsen would refer to ER given covid-19 restrictions at cancer center and her recent temp >100. I updated Dr. Janese Banks who agrees with this plan. I will do phone follow up with patient later this evening as well.   Visit Diagnosis 1. Low grade fever   2. Nausea without vomiting     Patient expressed understanding and was in agreement with this plan. She also understands that She can call clinic at any time with any questions, concerns, or complaints.   I discussed the assessment and treatment plan with the patient. The patient was provided an opportunity to ask questions and all were answered. The patient agreed with the plan and demonstrated an understanding of the instructions.   The patient was advised to call back or seek an in-person evaluation if the symptoms worsen or if the condition fails to improve as anticipated.   I provided 17 minutes of face-to-face video visit time during this encounter, and > 50% was spent counseling as documented under my assessment & plan.  Thank you for allowing me to participate in the care of this very pleasant patient.   Beckey Rutter, DNP, AGNP-C [place] [facility] [phone] (work cell) [phone] (office)  CC: Dr. Janese Banks

## 2019-08-08 NOTE — Telephone Encounter (Signed)
I spoke to Amanda Wade about her and she will do a video visit with her today

## 2019-08-08 NOTE — Telephone Encounter (Signed)
3 PM Doximity and Lousie is arriving her now

## 2019-08-08 NOTE — Telephone Encounter (Signed)
Amanda Wade- could you contact patient and get her scheduled? MyChart is one option and she can do self check-in or Doximity. Thanks!

## 2019-08-09 ENCOUNTER — Other Ambulatory Visit: Payer: Self-pay | Admitting: Oncology

## 2019-08-09 ENCOUNTER — Other Ambulatory Visit: Payer: Self-pay

## 2019-08-09 ENCOUNTER — Inpatient Hospital Stay: Payer: Medicare Other | Admitting: Hospice and Palliative Medicine

## 2019-08-09 DIAGNOSIS — Z20822 Contact with and (suspected) exposure to covid-19: Secondary | ICD-10-CM

## 2019-08-09 MED ORDER — AMOXICILLIN-POT CLAVULANATE 875-125 MG PO TABS: 1.0000 | ORAL_TABLET | Freq: Two times a day (BID) | ORAL | 0 refills | Status: DC

## 2019-08-10 LAB — NOVEL CORONAVIRUS, NAA: SARS-CoV-2, NAA: NOT DETECTED

## 2019-08-11 ENCOUNTER — Other Ambulatory Visit: Payer: Self-pay

## 2019-08-11 ENCOUNTER — Inpatient Hospital Stay: Payer: Medicare Other

## 2019-08-11 ENCOUNTER — Other Ambulatory Visit: Payer: Self-pay | Admitting: *Deleted

## 2019-08-11 ENCOUNTER — Inpatient Hospital Stay (HOSPITAL_BASED_OUTPATIENT_CLINIC_OR_DEPARTMENT_OTHER): Payer: Medicare Other | Admitting: Oncology

## 2019-08-11 ENCOUNTER — Encounter: Payer: Self-pay | Admitting: Oncology

## 2019-08-11 VITALS — BP 103/66 | HR 82 | Resp 18

## 2019-08-11 VITALS — BP 80/46 | Temp 99.3°F | Resp 16 | Wt 200.0 lb

## 2019-08-11 DIAGNOSIS — Z5111 Encounter for antineoplastic chemotherapy: Secondary | ICD-10-CM | POA: Diagnosis not present

## 2019-08-11 DIAGNOSIS — Z95828 Presence of other vascular implants and grafts: Secondary | ICD-10-CM

## 2019-08-11 DIAGNOSIS — E860 Dehydration: Secondary | ICD-10-CM

## 2019-08-11 DIAGNOSIS — R197 Diarrhea, unspecified: Secondary | ICD-10-CM

## 2019-08-11 DIAGNOSIS — C50412 Malignant neoplasm of upper-outer quadrant of left female breast: Secondary | ICD-10-CM

## 2019-08-11 DIAGNOSIS — I959 Hypotension, unspecified: Secondary | ICD-10-CM | POA: Diagnosis not present

## 2019-08-11 DIAGNOSIS — E871 Hypo-osmolality and hyponatremia: Secondary | ICD-10-CM

## 2019-08-11 LAB — COMPREHENSIVE METABOLIC PANEL
ALT: 22 U/L (ref 0–44)
AST: 51 U/L — ABNORMAL HIGH (ref 15–41)
Albumin: 2.8 g/dL — ABNORMAL LOW (ref 3.5–5.0)
Alkaline Phosphatase: 140 U/L — ABNORMAL HIGH (ref 38–126)
Anion gap: 12 (ref 5–15)
BUN: 22 mg/dL (ref 8–23)
CO2: 25 mmol/L (ref 22–32)
Calcium: 8.5 mg/dL — ABNORMAL LOW (ref 8.9–10.3)
Chloride: 92 mmol/L — ABNORMAL LOW (ref 98–111)
Creatinine, Ser: 1.13 mg/dL — ABNORMAL HIGH (ref 0.44–1.00)
GFR calc Af Amer: 57 mL/min — ABNORMAL LOW (ref >60)
GFR calc non Af Amer: 49 mL/min — ABNORMAL LOW (ref >60)
Glucose, Bld: 172 mg/dL — ABNORMAL HIGH (ref 70–99)
Potassium: 3.8 mmol/L (ref 3.5–5.1)
Sodium: 129 mmol/L — ABNORMAL LOW (ref 135–145)
Total Bilirubin: 0.7 mg/dL (ref 0.3–1.2)
Total Protein: 5.9 g/dL — ABNORMAL LOW (ref 6.5–8.1)

## 2019-08-11 LAB — CBC WITH DIFFERENTIAL/PLATELET
Abs Immature Granulocytes: 7.56 10*3/uL — ABNORMAL HIGH (ref 0.00–0.07)
Basophils Absolute: 0 10*3/uL (ref 0.0–0.1)
Basophils Relative: 0 %
Eosinophils Absolute: 0.1 10*3/uL (ref 0.0–0.5)
Eosinophils Relative: 0 %
HCT: 30.3 % — ABNORMAL LOW (ref 36.0–46.0)
Hemoglobin: 10.7 g/dL — ABNORMAL LOW (ref 12.0–15.0)
Immature Granulocytes: 17 %
Lymphocytes Relative: 3 %
Lymphs Abs: 1.4 10*3/uL (ref 0.7–4.0)
MCH: 29.7 pg (ref 26.0–34.0)
MCHC: 35.3 g/dL (ref 30.0–36.0)
MCV: 84.2 fL (ref 80.0–100.0)
Monocytes Absolute: 3.4 10*3/uL — ABNORMAL HIGH (ref 0.1–1.0)
Monocytes Relative: 8 %
Neutro Abs: 31.8 10*3/uL — ABNORMAL HIGH (ref 1.7–7.7)
Neutrophils Relative %: 72 %
Platelets: 254 10*3/uL (ref 150–400)
RBC: 3.6 MIL/uL — ABNORMAL LOW (ref 3.87–5.11)
RDW: 12.8 % (ref 11.5–15.5)
Smear Review: ADEQUATE
WBC: 45.2 10*3/uL — ABNORMAL HIGH (ref 4.0–10.5)
nRBC: 0.2 % (ref 0.0–0.2)

## 2019-08-11 MED ORDER — SODIUM CHLORIDE 0.9% FLUSH
10.0000 mL | Freq: Once | INTRAVENOUS | Status: AC
  Administered 2019-08-11: 10:00:00 10 mL via INTRAVENOUS
  Filled 2019-08-11: qty 10

## 2019-08-11 MED ORDER — SODIUM CHLORIDE 0.9 % IV SOLN
INTRAVENOUS | Status: AC
  Administered 2019-08-11: 10:00:00 via INTRAVENOUS
  Filled 2019-08-11 (×2): qty 250

## 2019-08-11 MED ORDER — HEPARIN SOD (PORK) LOCK FLUSH 100 UNIT/ML IV SOLN
500.0000 [IU] | Freq: Once | INTRAVENOUS | Status: AC
  Administered 2019-08-11: 500 [IU] via INTRAVENOUS
  Filled 2019-08-11: qty 5

## 2019-08-11 NOTE — Progress Notes (Signed)
Patient c/o diarrhea x 1 week with 3-5 loose stools per day in which are clay/beige in color. Patient states that she also feels very weak and has no appetite. She states that all she can eat is yogurt & water - and everything else seems to upset her stomach.

## 2019-08-11 NOTE — Progress Notes (Signed)
Hematology/Oncology Consult note [facility]  Telephone:([phone] [phone]  Patient Care Team: Ezequiel Kayser, MD as PCP - General (Internal Medicine)   Name of the patient: Amanda Wade  [id number]  [birth year]   Date of visit: 08/11/19  Diagnosis- pathological prognostic stage IA [id number] mammary carcinoma of the left breast status post lumpectomy and sentinel lymph node biopsy   Chief complaint/ Reason for visit-acute visit for ongoing fatigue and low-grade fever  Heme/Onc history: Patient is a 68 year old postmenopausal female with a past medical history significant for hypertension hyperlipidemia among other medical problems. She recently underwent a screening mammogram on 04/28/2019 which showed possible mass in the left breast. This was followed by diagnostic mammogram and ultrasound which showed bilobar hypo-Echoic mass at the 2:30 position of the left breast measuring 0.5 x 0.6 x 1.1 cm. Hypoechoic mass at the 2 o'clock position as well. And another bilobed irregular hypoechoic mass at the 3 o'clock position measuring 4 x 5 x 6 mm. Patient had biopsy of all these 3 masses. The 3 o'clock position breast mass came back as invasive mammary carcinoma 6 mm, grade 1 strongly ER PR positive greater than 90% and HER-2/neu negative. 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 230 breast mass also came back as intermediate grade DCIS. There was also another mass noted in the left breast which was biopsied and was 5 mm grade 1 ER PR positive and HER-2/neu negative.  Patient was taken for lumpectomy and sentinel lymph node biopsy by Dr. Bary Castilla on 06/05/2019.Final pathology showed invasive mammary carcinoma grade 1 ER PR positive and HER-2/neu negative at 4 different biopsy sites with extensive DCIS predominantly micropapillary type. One lymph node was also involved with macro metastatic carcinoma and was negative for extranodal  extension  Patient's case was discussed at tumor board and there was a concern that there may be potentially more areas of abnormality given extensive DCIS and invasive cancer seen in multiple foci in the lumpectomy specimen. Plan was to proceed with bilateral MRI.Breast MRI showed a large area of clumped discontinued non-mass enhancement in the left upper breast spanning an area of 9.8 cm in AP dimension suspicious for residual DCIS or invasive carcinoma.Patient therefore proceeded with r left mastectomy which showed small area of residual DCIS but no evidence of invasive malignancy.  Patient also had a MammaPrint testing done on her lumpectomy specimen which came back as high risk group. 29% chance of recurrence without chemotherapy at 5 years. Greater than 12% chemo benefit and estimated >93% survival at 5 years with chemotherapy and hormone therapy   Interval history- she feels fatigued. She has been having some diarrhea over last 2 days. Nausea has improved. Her temp has been around 99-100. She has been using prn tylenol  ECOG PS- 2 Pain scale- 0   Review of systems- Review of Systems  Constitutional: Positive for malaise/fatigue. Negative for chills, fever and weight loss.  HENT: Negative for congestion, ear discharge and nosebleeds.   Eyes: Negative for blurred vision.  Respiratory: Negative for cough, hemoptysis, sputum production, shortness of breath and wheezing.   Cardiovascular: Negative for chest pain, palpitations, orthopnea and claudication.  Gastrointestinal: Positive for diarrhea. Negative for abdominal pain, blood in stool, constipation, heartburn, melena, nausea and vomiting.  Genitourinary: Negative for dysuria, flank pain, frequency, hematuria and urgency.  Musculoskeletal: Negative for back pain, joint pain and myalgias.  Skin: Negative for rash.  Neurological: Negative for dizziness, tingling, focal weakness, seizures,  weakness and headaches.   Endo/Heme/Allergies: Does not bruise/bleed easily.  Psychiatric/Behavioral: Negative for depression and suicidal ideas. The patient does not have insomnia.       Allergies  Allergen Reactions  . Shellfish Allergy Rash  . Sulfa Antibiotics Rash     Past Medical History:  Diagnosis Date  . Complication of anesthesia   . Hyperlipidemia   . Hypertension   . Malignant neoplasm of upper-outer quadrant of left breast in female, estrogen receptor positive ([place]) 05/19/2019  . PONV (postoperative nausea and vomiting)   . Vertigo    intermittent especially lying on left side     Past Surgical History:  Procedure Laterality Date  . ABDOMINAL HYSTERECTOMY  2004   complete  . BREAST BIOPSY Left 05/05/2019   [facility]- ribbon clip  . BREAST BIOPSY Left 05/16/2019   [facility]-DCIS  . BREAST BIOPSY Left 05/16/2019   [facility]- X clip  . BREAST BIOPSY WITH SENTINEL LYMPH NODE BIOPSY AND NEEDLE LOCALIZATION Left 06/05/2019   Procedure: BREAST BIOPSY WITH SENTINEL LYMPH NODE BIOPSY AND NEEDLE LOCALIZATION, LEFT, BRACKETING WIRE;  Surgeon: Robert Bellow, MD;  Location: [facility] ORS;  Service: General;  Laterality: Left;  . BREAST CYST ASPIRATION Bilateral age 71   neg  . CHOLECYSTECTOMY    . COLONOSCOPY WITH PROPOFOL N/A 08/02/2018   Procedure: COLONOSCOPY WITH PROPOFOL;  Surgeon: [name], MD;  Location: [facility] ENDOSCOPY;  Service: Endoscopy;  Laterality: N/A;  . DILATION AND CURETTAGE OF UTERUS    . goiter surgery    . OOPHORECTOMY    . PORTACATH PLACEMENT Left 07/10/2019   Procedure: INSERTION PORT-A-CATH;  Surgeon: Jules Husbands, MD;  Location: [facility] ORS;  Service: General;  Laterality: Left;  . SIMPLE MASTECTOMY WITH AXILLARY SENTINEL NODE BIOPSY Left 07/10/2019   Procedure: SIMPLE MASTECTOMY.; LEFT;  Surgeon: Jules Husbands, MD;  Location: [facility] ORS;  Service: General;  Laterality: Left;  . THYROIDECTOMY, PARTIAL      Social History   Socioeconomic History  . Marital status: Divorced     Spouse name: Not on file  . Number of children: Not on file  . Years of education: Not on file  . Highest education level: Not on file  Occupational History  . Not on file  Social Needs  . Financial resource strain: Not on file  . Food insecurity    Worry: Not on file    Inability: Not on file  . Transportation needs    Medical: Not on file    Non-medical: Not on file  Tobacco Use  . Smoking status: Never Smoker  . Smokeless tobacco: Never Used  Substance and Sexual Activity  . Alcohol use: No  . Drug use: No  . Sexual activity: Not Currently  Lifestyle  . Physical activity    Days per week: Not on file    Minutes per session: Not on file  . Stress: Not on file  Relationships  . Social Herbalist on phone: Not on file    Gets together: Not on file    Attends religious service: Not on file    Active member of club or organization: Not on file    Attends meetings of clubs or organizations: Not on file    Relationship status: Not on file  . Intimate partner violence    Fear of current or ex partner: Not on file    Emotionally abused: Not on file    Physically abused: Not on file  Forced sexual activity: Not on file  Other Topics Concern  . Not on file  Social History Narrative  . Not on file    Family History  Problem Relation Age of Onset  . Breast cancer Cousin        mat cousin  . Hypertension Mother   . Heart attack Father   . Prostate cancer Father   . Diabetes Father   . Colon polyps Father      Current Outpatient Medications:  .  acetaminophen (TYLENOL) 500 MG tablet, Take 500 mg by mouth every 6 (six) hours as needed (for pain/headaches.)., Disp: , Rfl:  .  amoxicillin-clavulanate (AUGMENTIN) 875-125 MG tablet, Take 1 tablet by mouth 2 (two) times daily., Disp: 14 tablet, Rfl: 0 .  aspirin EC 81 MG tablet, Take 81 mg by mouth daily., Disp: , Rfl:  .  atorvastatin (LIPITOR) 10 MG tablet, Take 10 mg by mouth daily. , Disp: , Rfl:  .   Calcium Carbonate-Vitamin D 600-400 MG-UNIT tablet, Take 1 tablet by mouth daily. , Disp: , Rfl:  .  dexamethasone (DECADRON) 4 MG tablet, Take 2 tablets (8 mg total) by mouth 2 (two) times daily. Start the day before Taxotere. Then again the day after chemo for 3 days., Disp: 30 tablet, Rfl: 1 .  lidocaine-prilocaine (EMLA) cream, Apply to affected area once, Disp: 30 g, Rfl: 3 .  lisinopril-hydrochlorothiazide (PRINZIDE,ZESTORETIC) 20-12.5 MG tablet, Take 1 tablet by mouth daily., Disp: , Rfl:  .  Multiple Vitamin (MULTIVITAMIN WITH MINERALS) TABS tablet, Take 1 tablet by mouth daily after breakfast. Centrum Silver, Disp: , Rfl:  .  ondansetron (ZOFRAN) 8 MG tablet, Take 1 tablet (8 mg total) by mouth 2 (two) times daily as needed for refractory nausea / vomiting. Start on day 3 after chemo., Disp: 30 tablet, Rfl: 1 .  potassium chloride SA (K-DUR) 20 MEQ tablet, Take 1 tablet (20 mEq total) by mouth daily., Disp: 10 tablet, Rfl: 0 .  prochlorperazine (COMPAZINE) 10 MG tablet, Take 1 tablet (10 mg total) by mouth every 6 (six) hours as needed (Nausea or vomiting)., Disp: 30 tablet, Rfl: 1  Physical exam:  Vitals:   08/11/19 0901  BP: (!) 80/46  Resp: 16  Temp: 99.3 F (37.4 C)  TempSrc: Tympanic  Weight: 200 lb (90.7 kg)   Physical Exam Constitutional:      Comments: Appears fatigued  HENT:     Head: Normocephalic and atraumatic.  Eyes:     Pupils: Pupils are equal, round, and reactive to light.  Neck:     Musculoskeletal: Normal range of motion.  Cardiovascular:     Rate and Rhythm: Normal rate and regular rhythm.     Heart sounds: Normal heart sounds.  Pulmonary:     Effort: Pulmonary effort is normal.     Breath sounds: Normal breath sounds.  Abdominal:     General: Bowel sounds are normal.     Palpations: Abdomen is soft.  Skin:    General: Skin is warm and dry.  Neurological:     Mental Status: She is alert and oriented to person, place, and time.      CMP Latest  Ref Rng & Units 08/11/2019  Glucose 70 - 99 mg/dL 172(H)  BUN 8 - 23 mg/dL 22  Creatinine 0.44 - 1.00 mg/dL 1.13(H)  Sodium 135 - 145 mmol/L 129(L)  Potassium 3.5 - 5.1 mmol/L 3.8  Chloride 98 - 111 mmol/L 92(L)  CO2 22 - 32 mmol/L 25  Calcium 8.9 - 10.3 mg/dL 8.5(L)  Total Protein 6.5 - 8.1 g/dL 5.9(L)  Total Bilirubin 0.3 - 1.2 mg/dL 0.7  Alkaline Phos 38 - 126 U/L 140(H)  AST 15 - 41 U/L 51(H)  ALT 0 - 44 U/L 22   CBC Latest Ref Rng & Units 08/11/2019  WBC 4.0 - 10.5 K/uL 45.2(H)  Hemoglobin 12.0 - 15.0 g/dL 10.7(L)  Hematocrit 36.0 - 46.0 % 30.3(L)  Platelets 150 - 400 K/uL 254      Assessment and plan- Patient is a 68 y.o. female with invasive mammary carcinoma of the left breast pathological prognostic stage I [id number] ER PR positive HER-2/neu negative status post lumpectomy and sentinel lymph node biopsy.She is high risk mammprint score and s/p mastectomy given concerns for residual disease.    She is status post 1 cycle of adjuvant TC chemotherapy and here for acute visit for ongoing weakness and low-grade fever  1.  Low-grade fever: Covid testing was negative.  He had some urinary tract infection symptoms a week ago and was started on Augmentin and she has completed 7 days.  Urine culture did not reveal any growth.  Showed mixed urogenital flora.  I have asked her to stop taking her Augmentin at this time given her ongoing diarrhea  2.  Diarrhea: Possibly secondary to chemotherapy and/or Augmentin.  She can take as needed Imodium at this time but if her diarrhea continues for the next 1 to 2 days she will give a stool sample for C. Difficile  3.  Hyponatremia/hypotension and dehydration: I have asked her to hold off on taking her lisinopril at this time.  She did take it this morning.  We will give her 1 L of IV fluids today.  She is on scheduled to receive IV fluids again in 3 days time.  Also plan for possible IV fluids in 1 week  4.  Leukocytosis: Likely secondary to  Neulasta.  Continue to monitor  I will see her back in 2 weeks with CBC with differential, CMP for cycle 2 of TC chemotherapy and I will consider dose reducing her Taxotere at that time   Visit Diagnosis 1. Dehydration   2. Hyponatremia   3. Hypotension, unspecified hypotension type      Dr. Randa Evens, MD, MPH [facility] [phone] 08/11/2019 11:36 AM

## 2019-08-14 ENCOUNTER — Inpatient Hospital Stay: Payer: Medicare Other

## 2019-08-14 ENCOUNTER — Inpatient Hospital Stay: Payer: Medicare Other | Admitting: Oncology

## 2019-08-18 ENCOUNTER — Inpatient Hospital Stay: Payer: Medicare Other | Admitting: *Deleted

## 2019-08-18 ENCOUNTER — Inpatient Hospital Stay: Payer: Medicare Other

## 2019-08-18 ENCOUNTER — Other Ambulatory Visit: Payer: Self-pay

## 2019-08-18 ENCOUNTER — Ambulatory Visit: Payer: Medicare Other

## 2019-08-18 DIAGNOSIS — Z5111 Encounter for antineoplastic chemotherapy: Secondary | ICD-10-CM | POA: Diagnosis not present

## 2019-08-18 DIAGNOSIS — C50412 Malignant neoplasm of upper-outer quadrant of left female breast: Secondary | ICD-10-CM

## 2019-08-18 DIAGNOSIS — Z95828 Presence of other vascular implants and grafts: Secondary | ICD-10-CM

## 2019-08-18 DIAGNOSIS — Z170 Estrogen receptor positive status [ER+]: Secondary | ICD-10-CM

## 2019-08-18 LAB — CBC WITH DIFFERENTIAL/PLATELET
Abs Immature Granulocytes: 1.66 10*3/uL — ABNORMAL HIGH (ref 0.00–0.07)
Basophils Absolute: 0.2 10*3/uL — ABNORMAL HIGH (ref 0.0–0.1)
Basophils Relative: 1 %
Eosinophils Absolute: 0.1 10*3/uL (ref 0.0–0.5)
Eosinophils Relative: 0 %
HCT: 31.7 % — ABNORMAL LOW (ref 36.0–46.0)
Hemoglobin: 10.7 g/dL — ABNORMAL LOW (ref 12.0–15.0)
Immature Granulocytes: 10 %
Lymphocytes Relative: 11 %
Lymphs Abs: 1.9 10*3/uL (ref 0.7–4.0)
MCH: 29.4 pg (ref 26.0–34.0)
MCHC: 33.8 g/dL (ref 30.0–36.0)
MCV: 87.1 fL (ref 80.0–100.0)
Monocytes Absolute: 0.7 10*3/uL (ref 0.1–1.0)
Monocytes Relative: 4 %
Neutro Abs: 12.4 10*3/uL — ABNORMAL HIGH (ref 1.7–7.7)
Neutrophils Relative %: 74 %
Platelets: 268 10*3/uL (ref 150–400)
RBC: 3.64 MIL/uL — ABNORMAL LOW (ref 3.87–5.11)
RDW: 13.4 % (ref 11.5–15.5)
WBC: 16.9 10*3/uL — ABNORMAL HIGH (ref 4.0–10.5)
nRBC: 0.6 % — ABNORMAL HIGH (ref 0.0–0.2)

## 2019-08-18 LAB — COMPREHENSIVE METABOLIC PANEL
ALT: 26 U/L (ref 0–44)
AST: 25 U/L (ref 15–41)
Albumin: 3.4 g/dL — ABNORMAL LOW (ref 3.5–5.0)
Alkaline Phosphatase: 77 U/L (ref 38–126)
Anion gap: 9 (ref 5–15)
BUN: 12 mg/dL (ref 8–23)
CO2: 27 mmol/L (ref 22–32)
Calcium: 8.8 mg/dL — ABNORMAL LOW (ref 8.9–10.3)
Chloride: 101 mmol/L (ref 98–111)
Creatinine, Ser: 0.98 mg/dL (ref 0.44–1.00)
GFR calc Af Amer: >60 mL/min (ref >60)
GFR calc non Af Amer: 58 mL/min — ABNORMAL LOW (ref >60)
Glucose, Bld: 166 mg/dL — ABNORMAL HIGH (ref 70–99)
Potassium: 3.9 mmol/L (ref 3.5–5.1)
Sodium: 137 mmol/L (ref 135–145)
Total Bilirubin: 0.4 mg/dL (ref 0.3–1.2)
Total Protein: 6.4 g/dL — ABNORMAL LOW (ref 6.5–8.1)

## 2019-08-18 MED ORDER — SODIUM CHLORIDE 0.9% FLUSH
10.0000 mL | Freq: Once | INTRAVENOUS | Status: AC
  Administered 2019-08-18: 11:00:00 10 mL via INTRAVENOUS
  Filled 2019-08-18: qty 10

## 2019-08-24 ENCOUNTER — Other Ambulatory Visit: Payer: Self-pay

## 2019-08-24 ENCOUNTER — Encounter: Payer: Self-pay | Admitting: Oncology

## 2019-08-24 NOTE — Progress Notes (Signed)
Tingling after chemo within 5 days after chemo - tingling in left foot. Pt has couple of sores and it was 2 and it went away. Energy level is not as good as it was before. Muscles  Sore when she uses them and then it stops and gets better.

## 2019-08-24 NOTE — Progress Notes (Signed)
Called patient no answer left message  

## 2019-08-25 ENCOUNTER — Inpatient Hospital Stay: Payer: Medicare Other | Attending: Oncology | Admitting: Oncology

## 2019-08-25 ENCOUNTER — Inpatient Hospital Stay: Payer: Medicare Other

## 2019-08-25 ENCOUNTER — Other Ambulatory Visit: Payer: Self-pay

## 2019-08-25 ENCOUNTER — Encounter: Payer: Self-pay | Admitting: Oncology

## 2019-08-25 VITALS — BP 127/69 | HR 80 | Temp 97.1°F | Resp 16 | Ht 61.0 in | Wt 207.4 lb

## 2019-08-25 VITALS — HR 82

## 2019-08-25 DIAGNOSIS — C50412 Malignant neoplasm of upper-outer quadrant of left female breast: Secondary | ICD-10-CM | POA: Diagnosis present

## 2019-08-25 DIAGNOSIS — Z5111 Encounter for antineoplastic chemotherapy: Secondary | ICD-10-CM | POA: Insufficient documentation

## 2019-08-25 DIAGNOSIS — Z9079 Acquired absence of other genital organ(s): Secondary | ICD-10-CM | POA: Diagnosis not present

## 2019-08-25 DIAGNOSIS — Z79899 Other long term (current) drug therapy: Secondary | ICD-10-CM | POA: Diagnosis not present

## 2019-08-25 DIAGNOSIS — E785 Hyperlipidemia, unspecified: Secondary | ICD-10-CM | POA: Insufficient documentation

## 2019-08-25 DIAGNOSIS — Z9012 Acquired absence of left breast and nipple: Secondary | ICD-10-CM | POA: Diagnosis not present

## 2019-08-25 DIAGNOSIS — Z170 Estrogen receptor positive status [ER+]: Secondary | ICD-10-CM | POA: Diagnosis not present

## 2019-08-25 DIAGNOSIS — Z90710 Acquired absence of both cervix and uterus: Secondary | ICD-10-CM | POA: Diagnosis not present

## 2019-08-25 DIAGNOSIS — Z5189 Encounter for other specified aftercare: Secondary | ICD-10-CM | POA: Insufficient documentation

## 2019-08-25 DIAGNOSIS — Z7982 Long term (current) use of aspirin: Secondary | ICD-10-CM | POA: Insufficient documentation

## 2019-08-25 DIAGNOSIS — Z90722 Acquired absence of ovaries, bilateral: Secondary | ICD-10-CM | POA: Insufficient documentation

## 2019-08-25 DIAGNOSIS — I10 Essential (primary) hypertension: Secondary | ICD-10-CM | POA: Insufficient documentation

## 2019-08-25 DIAGNOSIS — R7989 Other specified abnormal findings of blood chemistry: Secondary | ICD-10-CM | POA: Diagnosis not present

## 2019-08-25 DIAGNOSIS — Z803 Family history of malignant neoplasm of breast: Secondary | ICD-10-CM | POA: Diagnosis not present

## 2019-08-25 LAB — COMPREHENSIVE METABOLIC PANEL
ALT: 20 U/L (ref 0–44)
AST: 20 U/L (ref 15–41)
Albumin: 3.3 g/dL — ABNORMAL LOW (ref 3.5–5.0)
Alkaline Phosphatase: 59 U/L (ref 38–126)
Anion gap: 9 (ref 5–15)
BUN: 20 mg/dL (ref 8–23)
CO2: 24 mmol/L (ref 22–32)
Calcium: 9 mg/dL (ref 8.9–10.3)
Chloride: 104 mmol/L (ref 98–111)
Creatinine, Ser: 0.77 mg/dL (ref 0.44–1.00)
GFR calc Af Amer: >60 mL/min (ref >60)
GFR calc non Af Amer: >60 mL/min (ref >60)
Glucose, Bld: 257 mg/dL — ABNORMAL HIGH (ref 70–99)
Potassium: 3.7 mmol/L (ref 3.5–5.1)
Sodium: 137 mmol/L (ref 135–145)
Total Bilirubin: 0.3 mg/dL (ref 0.3–1.2)
Total Protein: 6.2 g/dL — ABNORMAL LOW (ref 6.5–8.1)

## 2019-08-25 LAB — CBC WITH DIFFERENTIAL/PLATELET
Abs Immature Granulocytes: 0.5 10*3/uL — ABNORMAL HIGH (ref 0.00–0.07)
Basophils Absolute: 0 10*3/uL (ref 0.0–0.1)
Basophils Relative: 0 %
Eosinophils Absolute: 0 10*3/uL (ref 0.0–0.5)
Eosinophils Relative: 0 %
HCT: 29.3 % — ABNORMAL LOW (ref 36.0–46.0)
Hemoglobin: 10 g/dL — ABNORMAL LOW (ref 12.0–15.0)
Immature Granulocytes: 3 %
Lymphocytes Relative: 4 %
Lymphs Abs: 0.8 10*3/uL (ref 0.7–4.0)
MCH: 29.6 pg (ref 26.0–34.0)
MCHC: 34.1 g/dL (ref 30.0–36.0)
MCV: 86.7 fL (ref 80.0–100.0)
Monocytes Absolute: 0.5 10*3/uL (ref 0.1–1.0)
Monocytes Relative: 3 %
Neutro Abs: 15.5 10*3/uL — ABNORMAL HIGH (ref 1.7–7.7)
Neutrophils Relative %: 90 %
Platelets: 505 10*3/uL — ABNORMAL HIGH (ref 150–400)
RBC: 3.38 MIL/uL — ABNORMAL LOW (ref 3.87–5.11)
RDW: 13.8 % (ref 11.5–15.5)
WBC: 17.2 10*3/uL — ABNORMAL HIGH (ref 4.0–10.5)
nRBC: 0 % (ref 0.0–0.2)

## 2019-08-25 MED ORDER — SODIUM CHLORIDE 0.9% FLUSH
10.0000 mL | Freq: Once | INTRAVENOUS | Status: AC
  Administered 2019-08-25: 10 mL via INTRAVENOUS
  Filled 2019-08-25: qty 10

## 2019-08-25 MED ORDER — SODIUM CHLORIDE 0.9 % IV SOLN
Freq: Once | INTRAVENOUS | Status: AC
  Administered 2019-08-25: 11:00:00 via INTRAVENOUS
  Filled 2019-08-25: qty 250

## 2019-08-25 MED ORDER — SODIUM CHLORIDE 0.9 % IV SOLN
60.0000 mg/m2 | Freq: Once | INTRAVENOUS | Status: AC
  Administered 2019-08-25: 120 mg via INTRAVENOUS
  Filled 2019-08-25: qty 12

## 2019-08-25 MED ORDER — HEPARIN SOD (PORK) LOCK FLUSH 100 UNIT/ML IV SOLN
500.0000 [IU] | Freq: Once | INTRAVENOUS | Status: DC | PRN
  Filled 2019-08-25: qty 5

## 2019-08-25 MED ORDER — HEPARIN SOD (PORK) LOCK FLUSH 100 UNIT/ML IV SOLN
500.0000 [IU] | Freq: Once | INTRAVENOUS | Status: AC
  Administered 2019-08-25: 13:00:00 500 [IU] via INTRAVENOUS

## 2019-08-25 MED ORDER — SODIUM CHLORIDE 0.9 % IV SOLN
600.0000 mg/m2 | Freq: Once | INTRAVENOUS | Status: AC
  Administered 2019-08-25: 1200 mg via INTRAVENOUS
  Filled 2019-08-25: qty 50

## 2019-08-25 MED ORDER — SODIUM CHLORIDE 0.9 % IV SOLN: 10.0000 mg | Freq: Once | INTRAVENOUS | Status: DC

## 2019-08-25 MED ORDER — DEXAMETHASONE SODIUM PHOSPHATE 10 MG/ML IJ SOLN
10.0000 mg | Freq: Once | INTRAMUSCULAR | Status: AC
  Administered 2019-08-25: 10 mg via INTRAVENOUS
  Filled 2019-08-25: qty 1

## 2019-08-25 MED ORDER — PEGFILGRASTIM 6 MG/0.6ML ~~LOC~~ PSKT
6.0000 mg | PREFILLED_SYRINGE | Freq: Once | SUBCUTANEOUS | Status: AC
  Administered 2019-08-25: 13:00:00 6 mg via SUBCUTANEOUS
  Filled 2019-08-25: qty 0.6

## 2019-08-25 MED ORDER — PALONOSETRON HCL INJECTION 0.25 MG/5ML
0.2500 mg | Freq: Once | INTRAVENOUS | Status: AC
  Administered 2019-08-25: 11:00:00 0.25 mg via INTRAVENOUS
  Filled 2019-08-25: qty 5

## 2019-08-25 NOTE — Progress Notes (Signed)
Hematology/Oncology Consult note [facility]  [phone] [phone]  Patient Care Team: Ezequiel Kayser, MD as PCP - General (Internal Medicine)   Name of the patient: Irmalee Wade  [id number]  05/24/1951   Date of visit: 08/25/19  Diagnosis- pathological prognostic stage IA [id number] mammary carcinoma of the left breast status post lumpectomy and sentinel lymph node biopsy  Chief complaint/ Reason for visit-on treatment assessment prior to cycle 2 of adjuvant TC chemotherapy  Heme/Onc history: Patient is a 68 year old postmenopausal female with a past medical history significant for hypertension hyperlipidemia among other medical problems. She recently underwent a screening mammogram on 04/28/2019 which showed possible mass in the left breast. This was followed by diagnostic mammogram and ultrasound which showed bilobar hypo-Echoic mass at the 2:30 position of the left breast measuring 0.5 x 0.6 x 1.1 cm. Hypoechoic mass at the 2 o'clock position as well. And another bilobed irregular hypoechoic mass at the 3 o'clock position measuring 4 x 5 x 6 mm. Patient had biopsy of all these 3 masses. The 3 o'clock position breast mass came back as invasive mammary carcinoma 6 mm, grade 1 strongly ER PR positive greater than 90% and HER-2/neu negative. 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 230 breast mass also came back as intermediate grade DCIS. There was also another mass noted in the left breast which was biopsied and was 5 mm grade 1 ER PR positive and HER-2/neu negative.  Patient was taken for lumpectomy and sentinel lymph node biopsy by Dr. Bary Castilla on 06/05/2019.Final pathology showed invasive mammary carcinoma grade 1 ER PR positive and HER-2/neu negative at 4 different biopsy sites with extensive DCIS predominantly micropapillary type. One lymph node was also involved with macro metastatic carcinoma and was negative  for extranodal extension  Patient's case was discussed at tumor board and there was a concern that there may be potentially more areas of abnormality given extensive DCIS and invasive cancer seen in multiple foci in the lumpectomy specimen. Plan was to proceed with bilateral MRI.Breast MRI showed a large area of clumped discontinued non-mass enhancement in the left upper breast spanning an area of 9.8 cm in AP dimension suspicious for residual DCIS or invasive carcinoma.Patient therefore proceeded with r left mastectomy which showed small area of residual DCIS but no evidence of invasive malignancy.  Patient also had a MammaPrint testing done on her lumpectomy specimen which came back as high risk group. 29% chance of recurrence without chemotherapy at 5 years. Greater than 12% chemo benefit and estimated >93% survival at 5 years with chemotherapy and hormone therapy   Interval history-she feels mildly better today.  Her diarrhea and urinary symptoms have resolved.  She reported heel pain which came on a couple of days after chemotherapy but has resolved since then  ECOG PS- 1 Pain scale- 0   Review of systems- Review of Systems  Constitutional: Negative for chills, fever, malaise/fatigue and weight loss.  HENT: Negative for congestion, ear discharge and nosebleeds.   Eyes: Negative for blurred vision.  Respiratory: Negative for cough, hemoptysis, sputum production, shortness of breath and wheezing.   Cardiovascular: Negative for chest pain, palpitations, orthopnea and claudication.  Gastrointestinal: Negative for abdominal pain, blood in stool, constipation, diarrhea, heartburn, melena, nausea and vomiting.  Genitourinary: Negative for dysuria, flank pain, frequency, hematuria and urgency.  Musculoskeletal: Negative for back pain, joint pain and myalgias.  Skin: Negative for rash.  Neurological: Negative for dizziness, tingling, focal weakness,  seizures, weakness and headaches.   Endo/Heme/Allergies: Does not bruise/bleed easily.  Psychiatric/Behavioral: Negative for depression and suicidal ideas. The patient does not have insomnia.       Allergies  Allergen Reactions  . Shellfish Allergy Rash  . Sulfa Antibiotics Rash     Past Medical History:  Diagnosis Date  . Complication of anesthesia   . Hyperlipidemia   . Hypertension   . Malignant neoplasm of upper-outer quadrant of left breast in female, estrogen receptor positive ([place]) 05/19/2019  . PONV (postoperative nausea and vomiting)   . Vertigo    intermittent especially lying on left side     Past Surgical History:  Procedure Laterality Date  . ABDOMINAL HYSTERECTOMY  2004   complete  . BREAST BIOPSY Left 05/05/2019   [facility]- ribbon clip  . BREAST BIOPSY Left 05/16/2019   [facility]-DCIS  . BREAST BIOPSY Left 05/16/2019   [facility]- X clip  . BREAST BIOPSY WITH SENTINEL LYMPH NODE BIOPSY AND NEEDLE LOCALIZATION Left 06/05/2019   Procedure: BREAST BIOPSY WITH SENTINEL LYMPH NODE BIOPSY AND NEEDLE LOCALIZATION, LEFT, BRACKETING WIRE;  Surgeon: Robert Bellow, MD;  Location: [facility] ORS;  Service: General;  Laterality: Left;  . BREAST CYST ASPIRATION Bilateral age 74   neg  . CHOLECYSTECTOMY    . COLONOSCOPY WITH PROPOFOL N/A 08/02/2018   Procedure: COLONOSCOPY WITH PROPOFOL;  Surgeon: [name], MD;  Location: [facility] ENDOSCOPY;  Service: Endoscopy;  Laterality: N/A;  . DILATION AND CURETTAGE OF UTERUS    . goiter surgery    . OOPHORECTOMY    . PORTACATH PLACEMENT Left 07/10/2019   Procedure: INSERTION PORT-A-CATH;  Surgeon: Jules Husbands, MD;  Location: [facility] ORS;  Service: General;  Laterality: Left;  . SIMPLE MASTECTOMY WITH AXILLARY SENTINEL NODE BIOPSY Left 07/10/2019   Procedure: SIMPLE MASTECTOMY.; LEFT;  Surgeon: Jules Husbands, MD;  Location: [facility] ORS;  Service: General;  Laterality: Left;  . THYROIDECTOMY, PARTIAL      Social History   Socioeconomic History  . Marital status: Divorced     Spouse name: Not on file  . Number of children: Not on file  . Years of education: Not on file  . Highest education level: Not on file  Occupational History  . Not on file  Social Needs  . Financial resource strain: Not on file  . Food insecurity    Worry: Not on file    Inability: Not on file  . Transportation needs    Medical: Not on file    Non-medical: Not on file  Tobacco Use  . Smoking status: Never Smoker  . Smokeless tobacco: Never Used  Substance and Sexual Activity  . Alcohol use: No  . Drug use: No  . Sexual activity: Not Currently  Lifestyle  . Physical activity    Days per week: Not on file    Minutes per session: Not on file  . Stress: Not on file  Relationships  . Social Herbalist on phone: Not on file    Gets together: Not on file    Attends religious service: Not on file    Active member of club or organization: Not on file    Attends meetings of clubs or organizations: Not on file    Relationship status: Not on file  . Intimate partner violence    Fear of current or ex partner: Not on file    Emotionally abused: Not on file    Physically abused: Not on file  Forced sexual activity: Not on file  Other Topics Concern  . Not on file  Social History Narrative  . Not on file    Family History  Problem Relation Age of Onset  . Breast cancer Cousin        mat cousin  . Hypertension Mother   . Heart attack Father   . Prostate cancer Father   . Diabetes Father   . Colon polyps Father      Current Outpatient Medications:  .  acetaminophen (TYLENOL) 500 MG tablet, Take 500 mg by mouth every 6 (six) hours as needed (for pain/headaches.)., Disp: , Rfl:  .  aspirin EC 81 MG tablet, Take 81 mg by mouth daily., Disp: , Rfl:  .  atorvastatin (LIPITOR) 10 MG tablet, Take 10 mg by mouth daily. , Disp: , Rfl:  .  Calcium Carbonate-Vitamin D 600-400 MG-UNIT tablet, Take 1 tablet by mouth daily. , Disp: , Rfl:  .  dexamethasone (DECADRON) 4 MG  tablet, Take 2 tablets (8 mg total) by mouth 2 (two) times daily. Start the day before Taxotere. Then again the day after chemo for 3 days., Disp: 30 tablet, Rfl: 1 .  lidocaine-prilocaine (EMLA) cream, Apply to affected area once, Disp: 30 g, Rfl: 3 .  lisinopril-hydrochlorothiazide (PRINZIDE,ZESTORETIC) 20-12.5 MG tablet, Take 1 tablet by mouth daily., Disp: , Rfl:  .  Multiple Vitamin (MULTIVITAMIN WITH MINERALS) TABS tablet, Take 1 tablet by mouth daily after breakfast. Centrum Silver, Disp: , Rfl:  .  amoxicillin-clavulanate (AUGMENTIN) 875-125 MG tablet, Take 1 tablet by mouth 2 (two) times daily., Disp: 14 tablet, Rfl: 0 .  ondansetron (ZOFRAN) 8 MG tablet, Take 1 tablet (8 mg total) by mouth 2 (two) times daily as needed for refractory nausea / vomiting. Start on day 3 after chemo. (Patient not taking: Reported on 08/24/2019), Disp: 30 tablet, Rfl: 1 .  potassium chloride SA (K-DUR) 20 MEQ tablet, Take 1 tablet (20 mEq total) by mouth daily., Disp: 10 tablet, Rfl: 0 .  prochlorperazine (COMPAZINE) 10 MG tablet, Take 1 tablet (10 mg total) by mouth every 6 (six) hours as needed (Nausea or vomiting). (Patient not taking: Reported on 08/24/2019), Disp: 30 tablet, Rfl: 1 No current facility-administered medications for this visit.   Facility-Administered Medications Ordered in Other Visits:  .  cyclophosphamide (CYTOXAN) 1,200 mg in sodium chloride 0.9 % 250 mL chemo infusion, 600 mg/m2 (Treatment Plan Recorded), Intravenous, Once, Sindy Guadeloupe, MD, Last Rate: 620 mL/hr at 08/25/19 1250, 1,200 mg at 08/25/19 1250 .  heparin lock flush 100 unit/mL, 500 Units, Intravenous, Once, Sindy Guadeloupe, MD .  heparin lock flush 100 unit/mL, 500 Units, Intracatheter, Once PRN, Sindy Guadeloupe, MD .  pegfilgrastim (NEULASTA ONPRO KIT) injection 6 mg, 6 mg, Subcutaneous, Once, Sindy Guadeloupe, MD  Physical exam:  Vitals:   08/25/19 1002  BP: 127/69  Pulse: (!) 2  Resp: 16  Temp: (!) 97.1 F (36.2 C)   TempSrc: Tympanic  Weight: 207 lb 6.4 oz (94.1 kg)  Height: _0  (1.549 m)   Physical Exam Constitutional:      General: She is not in acute distress. HENT:     Head: Normocephalic and atraumatic.  Eyes:     Pupils: Pupils are equal, round, and reactive to light.  Neck:     Musculoskeletal: Normal range of motion.  Cardiovascular:     Rate and Rhythm: Normal rate and regular rhythm.     Heart sounds: Normal heart sounds.  Pulmonary:     Effort: Pulmonary effort is normal.     Breath sounds: Normal breath sounds.  Abdominal:     General: Bowel sounds are normal.     Palpations: Abdomen is soft.  Skin:    General: Skin is warm and dry.  Neurological:     Mental Status: She is alert and oriented to person, place, and time.      CMP Latest Ref Rng & Units 08/25/2019  Glucose 70 - 99 mg/dL 257(H)  BUN 8 - 23 mg/dL 20  Creatinine 0.44 - 1.00 mg/dL 0.77  Sodium 135 - 145 mmol/L 137  Potassium 3.5 - 5.1 mmol/L 3.7  Chloride 98 - 111 mmol/L 104  CO2 22 - 32 mmol/L 24  Calcium 8.9 - 10.3 mg/dL 9.0  Total Protein 6.5 - 8.1 g/dL 6.2(L)  Total Bilirubin 0.3 - 1.2 mg/dL 0.3  Alkaline Phos 38 - 126 U/L 59  AST 15 - 41 U/L 20  ALT 0 - 44 U/L 20   CBC Latest Ref Rng & Units 08/25/2019  WBC 4.0 - 10.5 K/uL 17.2(H)  Hemoglobin 12.0 - 15.0 g/dL 10.0(L)  Hematocrit 36.0 - 46.0 % 29.3(L)  Platelets 150 - 400 K/uL 505(H)      Assessment and plan- Patient is a 68 y.o. female with invasive mammary carcinoma of the left breast pathological prognostic stage I [id number] ER PR positive HER-2/neu negative status post lumpectomy and sentinel lymph node biopsy.She is high risk mammprint score and s/p mastectomygiven concerns for residual disease.  She is here for on treatment assessment prior to cycle 2 of adjuvant TC chemotherapy  Patient has significant fatigue and dehydration after cycle 1 of chemotherapy.  I will therefore dose reduce her Taxotere to 60 mg per metered square.  She  does have an elevated white count and thrombocytosis today which may be secondary to her acute illness that she had after chemotherapy.  Symptomatically she is a whole lot better and therefore she will proceed with cycle 2 of TC chemotherapy today with ongoing Neulasta support.  I will see her back in 1 week's time to assess if she needs any IV fluids and assess her tolerance to cycle 2.  She will receive cycle 3 of adjuvant TC chemotherapy in 3 weeks time   Visit Diagnosis 1. Encounter for antineoplastic chemotherapy   2. Malignant neoplasm of upper-outer quadrant of left breast in female, estrogen receptor positive ([place])      Dr. Randa Evens, MD, MPH [facility] at [facility] [phone] 08/25/2019 12:53 PM

## 2019-09-01 ENCOUNTER — Inpatient Hospital Stay: Payer: Medicare Other

## 2019-09-01 ENCOUNTER — Inpatient Hospital Stay: Payer: Medicare Other | Admitting: Oncology

## 2019-09-14 ENCOUNTER — Other Ambulatory Visit: Payer: Self-pay

## 2019-09-14 ENCOUNTER — Encounter: Payer: Self-pay | Admitting: Oncology

## 2019-09-14 NOTE — Progress Notes (Signed)
Patient stated that she had noticed a low grade fever of 99 F every other afternoon. Patient stated that she did not feel bad, however, she starts to feeling cold and that's when she checks her temperature.

## 2019-09-15 ENCOUNTER — Inpatient Hospital Stay (HOSPITAL_BASED_OUTPATIENT_CLINIC_OR_DEPARTMENT_OTHER): Payer: Medicare Other | Admitting: Oncology

## 2019-09-15 ENCOUNTER — Other Ambulatory Visit: Payer: Self-pay

## 2019-09-15 ENCOUNTER — Inpatient Hospital Stay: Payer: Medicare Other

## 2019-09-15 VITALS — BP 147/52 | HR 99 | Temp 98.1°F | Ht 61.0 in | Wt 203.0 lb

## 2019-09-15 DIAGNOSIS — D6481 Anemia due to antineoplastic chemotherapy: Secondary | ICD-10-CM

## 2019-09-15 DIAGNOSIS — C50412 Malignant neoplasm of upper-outer quadrant of left female breast: Secondary | ICD-10-CM

## 2019-09-15 DIAGNOSIS — R739 Hyperglycemia, unspecified: Secondary | ICD-10-CM

## 2019-09-15 DIAGNOSIS — Z5111 Encounter for antineoplastic chemotherapy: Secondary | ICD-10-CM

## 2019-09-15 DIAGNOSIS — Z170 Estrogen receptor positive status [ER+]: Secondary | ICD-10-CM

## 2019-09-15 DIAGNOSIS — D649 Anemia, unspecified: Secondary | ICD-10-CM

## 2019-09-15 DIAGNOSIS — T451X5A Adverse effect of antineoplastic and immunosuppressive drugs, initial encounter: Secondary | ICD-10-CM

## 2019-09-15 LAB — CBC WITH DIFFERENTIAL/PLATELET
Abs Immature Granulocytes: 0.54 10*3/uL — ABNORMAL HIGH (ref 0.00–0.07)
Basophils Absolute: 0 10*3/uL (ref 0.0–0.1)
Basophils Relative: 0 %
Eosinophils Absolute: 0 10*3/uL (ref 0.0–0.5)
Eosinophils Relative: 0 %
HCT: 27.1 % — ABNORMAL LOW (ref 36.0–46.0)
Hemoglobin: 9.2 g/dL — ABNORMAL LOW (ref 12.0–15.0)
Immature Granulocytes: 4 %
Lymphocytes Relative: 6 %
Lymphs Abs: 0.9 10*3/uL (ref 0.7–4.0)
MCH: 30.1 pg (ref 26.0–34.0)
MCHC: 33.9 g/dL (ref 30.0–36.0)
MCV: 88.6 fL (ref 80.0–100.0)
Monocytes Absolute: 0.7 10*3/uL (ref 0.1–1.0)
Monocytes Relative: 4 %
Neutro Abs: 13.3 10*3/uL — ABNORMAL HIGH (ref 1.7–7.7)
Neutrophils Relative %: 86 %
Platelets: 511 10*3/uL — ABNORMAL HIGH (ref 150–400)
RBC: 3.06 MIL/uL — ABNORMAL LOW (ref 3.87–5.11)
RDW: 15.2 % (ref 11.5–15.5)
WBC: 15.3 10*3/uL — ABNORMAL HIGH (ref 4.0–10.5)
nRBC: 0 % (ref 0.0–0.2)

## 2019-09-15 LAB — COMPREHENSIVE METABOLIC PANEL
ALT: 19 U/L (ref 0–44)
AST: 24 U/L (ref 15–41)
Albumin: 3.4 g/dL — ABNORMAL LOW (ref 3.5–5.0)
Alkaline Phosphatase: 71 U/L (ref 38–126)
Anion gap: 11 (ref 5–15)
BUN: 16 mg/dL (ref 8–23)
CO2: 24 mmol/L (ref 22–32)
Calcium: 9.3 mg/dL (ref 8.9–10.3)
Chloride: 101 mmol/L (ref 98–111)
Creatinine, Ser: 0.92 mg/dL (ref 0.44–1.00)
GFR calc Af Amer: >60 mL/min (ref >60)
GFR calc non Af Amer: >60 mL/min (ref >60)
Glucose, Bld: 251 mg/dL — ABNORMAL HIGH (ref 70–99)
Potassium: 3.7 mmol/L (ref 3.5–5.1)
Sodium: 136 mmol/L (ref 135–145)
Total Bilirubin: 0.4 mg/dL (ref 0.3–1.2)
Total Protein: 6.5 g/dL (ref 6.5–8.1)

## 2019-09-15 LAB — IRON AND TIBC
Iron: 71 ug/dL (ref 28–170)
Saturation Ratios: 25 % (ref 10.4–31.8)
TIBC: 286 ug/dL (ref 250–450)
UIBC: 215 ug/dL

## 2019-09-15 LAB — RETICULOCYTES
Immature Retic Fract: 26.6 % — ABNORMAL HIGH (ref 2.3–15.9)
RBC.: 2.99 MIL/uL — ABNORMAL LOW (ref 3.87–5.11)
Retic Count, Absolute: 119.3 10*3/uL (ref 19.0–186.0)
Retic Ct Pct: 4 % — ABNORMAL HIGH (ref 0.4–3.1)

## 2019-09-15 LAB — FOLATE: Folate: 44 ng/mL (ref >5.9)

## 2019-09-15 LAB — VITAMIN B12: Vitamin B-12: 3983 pg/mL — ABNORMAL HIGH (ref 180–914)

## 2019-09-15 MED ORDER — SODIUM CHLORIDE 0.9 % IV SOLN
60.0000 mg/m2 | Freq: Once | INTRAVENOUS | Status: AC
  Administered 2019-09-15: 11:00:00 120 mg via INTRAVENOUS
  Filled 2019-09-15: qty 12

## 2019-09-15 MED ORDER — SODIUM CHLORIDE 0.9 % IV SOLN
600.0000 mg/m2 | Freq: Once | INTRAVENOUS | Status: AC
  Administered 2019-09-15: 1200 mg via INTRAVENOUS
  Filled 2019-09-15: qty 50

## 2019-09-15 MED ORDER — DEXAMETHASONE SODIUM PHOSPHATE 10 MG/ML IJ SOLN
10.0000 mg | Freq: Once | INTRAMUSCULAR | Status: AC
  Administered 2019-09-15: 10 mg via INTRAVENOUS
  Filled 2019-09-15: qty 1

## 2019-09-15 MED ORDER — SODIUM CHLORIDE 0.9 % IV SOLN
Freq: Once | INTRAVENOUS | Status: AC
  Administered 2019-09-15: 10:00:00 via INTRAVENOUS
  Filled 2019-09-15: qty 250

## 2019-09-15 MED ORDER — PEGFILGRASTIM 6 MG/0.6ML ~~LOC~~ PSKT
6.0000 mg | PREFILLED_SYRINGE | Freq: Once | SUBCUTANEOUS | Status: AC
  Administered 2019-09-15: 6 mg via SUBCUTANEOUS
  Filled 2019-09-15: qty 0.6

## 2019-09-15 MED ORDER — SODIUM CHLORIDE 0.9 % IV SOLN: 10.0000 mg | Freq: Once | INTRAVENOUS | Status: DC

## 2019-09-15 MED ORDER — HEPARIN SOD (PORK) LOCK FLUSH 100 UNIT/ML IV SOLN
500.0000 [IU] | Freq: Once | INTRAVENOUS | Status: AC
  Administered 2019-09-15: 500 [IU] via INTRAVENOUS
  Filled 2019-09-15: qty 5

## 2019-09-15 MED ORDER — HEPARIN SOD (PORK) LOCK FLUSH 100 UNIT/ML IV SOLN
500.0000 [IU] | Freq: Once | INTRAVENOUS | Status: AC | PRN
  Administered 2019-09-15: 500 [IU]

## 2019-09-15 MED ORDER — PALONOSETRON HCL INJECTION 0.25 MG/5ML
0.2500 mg | Freq: Once | INTRAVENOUS | Status: AC
  Administered 2019-09-15: 0.25 mg via INTRAVENOUS
  Filled 2019-09-15: qty 5

## 2019-09-15 MED ORDER — SODIUM CHLORIDE 0.9% FLUSH
10.0000 mL | Freq: Once | INTRAVENOUS | Status: AC
  Administered 2019-09-15: 09:00:00 10 mL via INTRAVENOUS
  Filled 2019-09-15: qty 10

## 2019-09-17 NOTE — Progress Notes (Signed)
Hematology/Oncology Consult note [facility]  Telephone:([phone] [phone]  Patient Care Team: Ezequiel Kayser, MD as PCP - General (Internal Medicine)   Name of the patient: Amanda Wade  [id number]  1951-11-22   Date of visit: 09/17/19  Diagnosis- pathological prognostic stage IA [id number] mammary carcinoma of the left breast status post lumpectomy and sentinel lymph node biopsy   Chief complaint/ Reason for visit-on treatment assessment prior to cycle 3 of adjuvant TC chemotherapy  Heme/Onc history: Patient is a 68 year old postmenopausal female with a past medical history significant for hypertension hyperlipidemia among other medical problems. She recently underwent a screening mammogram on 04/28/2019 which showed possible mass in the left breast. This was followed by diagnostic mammogram and ultrasound which showed bilobar hypo-Echoic mass at the 2:30 position of the left breast measuring 0.5 x 0.6 x 1.1 cm. Hypoechoic mass at the 2 o'clock position as well. And another bilobed irregular hypoechoic mass at the 3 o'clock position measuring 4 x 5 x 6 mm. Patient had biopsy of all these 3 masses. The 3 o'clock position breast mass came back as invasive mammary carcinoma 6 mm, grade 1 strongly ER PR positive greater than 90% and HER-2/neu negative. 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 230 breast mass also came back as intermediate grade DCIS. There was also another mass noted in the left breast which was biopsied and was 5 mm grade 1 ER PR positive and HER-2/neu negative.  Patient was taken for lumpectomy and sentinel lymph node biopsy by Dr. Bary Castilla on 06/05/2019.Final pathology showed invasive mammary carcinoma grade 1 ER PR positive and HER-2/neu negative at 4 different biopsy sites with extensive DCIS predominantly micropapillary type. One lymph node was also involved with macro metastatic carcinoma and was  negative for extranodal extension  Patient's case was discussed at tumor board and there was a concern that there may be potentially more areas of abnormality given extensive DCIS and invasive cancer seen in multiple foci in the lumpectomy specimen. Plan was to proceed with bilateral MRI.Breast MRI showed a large area of clumped discontinued non-mass enhancement in the left upper breast spanning an area of 9.8 cm in AP dimension suspicious for residual DCIS or invasive carcinoma.Patient therefore proceeded with r left mastectomy which showed small area of residual DCIS but no evidence of invasive malignancy.  Patient also had a MammaPrint testing done on her lumpectomy specimen which came back as high risk group. 29% chance of recurrence without chemotherapy at 5 years. Greater than 12% chemo benefit and estimated >93% survival at 5 years with chemotherapy and hormone therapy  Interval history-she feels fatigued 4 to 5 days after chemotherapy and it lasts for a week or so before she starts feeling better.  Overall she did tolerate cycle 2 of chemotherapy better after dose reduction.  Denies other complaints at this time.  Denies any tingling numbness in her extremities  ECOG PS- 1 Pain scale- 0  Review of systems- Review of Systems  Constitutional: Positive for malaise/fatigue. Negative for chills, fever and weight loss.  HENT: Negative for congestion, ear discharge and nosebleeds.   Eyes: Negative for blurred vision.  Respiratory: Negative for cough, hemoptysis, sputum production, shortness of breath and wheezing.   Cardiovascular: Negative for chest pain, palpitations, orthopnea and claudication.  Gastrointestinal: Negative for abdominal pain, blood in stool, constipation, diarrhea, heartburn, melena, nausea and vomiting.  Genitourinary: Negative for dysuria, flank pain, frequency, hematuria and urgency.  Musculoskeletal: Negative for  back pain, joint pain and myalgias.  Skin: Negative  for rash.  Neurological: Negative for dizziness, tingling, focal weakness, seizures, weakness and headaches.  Endo/Heme/Allergies: Does not bruise/bleed easily.  Psychiatric/Behavioral: Negative for depression and suicidal ideas. The patient does not have insomnia.        Allergies  Allergen Reactions  . Shellfish Allergy Rash  . Sulfa Antibiotics Rash     Past Medical History:  Diagnosis Date  . Complication of anesthesia   . Hyperlipidemia   . Hypertension   . Malignant neoplasm of upper-outer quadrant of left breast in female, estrogen receptor positive ([place]) 05/19/2019  . PONV (postoperative nausea and vomiting)   . Vertigo    intermittent especially lying on left side     Past Surgical History:  Procedure Laterality Date  . ABDOMINAL HYSTERECTOMY  2004   complete  . BREAST BIOPSY Left 05/05/2019   [facility]- ribbon clip  . BREAST BIOPSY Left 05/16/2019   [facility]-DCIS  . BREAST BIOPSY Left 05/16/2019   [facility]- X clip  . BREAST BIOPSY WITH SENTINEL LYMPH NODE BIOPSY AND NEEDLE LOCALIZATION Left 06/05/2019   Procedure: BREAST BIOPSY WITH SENTINEL LYMPH NODE BIOPSY AND NEEDLE LOCALIZATION, LEFT, BRACKETING WIRE;  Surgeon: Robert Bellow, MD;  Location: [facility] ORS;  Service: General;  Laterality: Left;  . BREAST CYST ASPIRATION Bilateral age 34   neg  . CHOLECYSTECTOMY    . COLONOSCOPY WITH PROPOFOL N/A 08/02/2018   Procedure: COLONOSCOPY WITH PROPOFOL;  Surgeon: [name], MD;  Location: [facility] ENDOSCOPY;  Service: Endoscopy;  Laterality: N/A;  . DILATION AND CURETTAGE OF UTERUS    . goiter surgery    . OOPHORECTOMY    . PORTACATH PLACEMENT Left 07/10/2019   Procedure: INSERTION PORT-A-CATH;  Surgeon: Jules Husbands, MD;  Location: [facility] ORS;  Service: General;  Laterality: Left;  . SIMPLE MASTECTOMY WITH AXILLARY SENTINEL NODE BIOPSY Left 07/10/2019   Procedure: SIMPLE MASTECTOMY.; LEFT;  Surgeon: Jules Husbands, MD;  Location: [facility] ORS;  Service: General;  Laterality: Left;   . THYROIDECTOMY, PARTIAL      Social History   Socioeconomic History  . Marital status: Divorced    Spouse name: Not on file  . Number of children: Not on file  . Years of education: Not on file  . Highest education level: Not on file  Occupational History  . Not on file  Social Needs  . Financial resource strain: Not on file  . Food insecurity    Worry: Not on file    Inability: Not on file  . Transportation needs    Medical: Not on file    Non-medical: Not on file  Tobacco Use  . Smoking status: Never Smoker  . Smokeless tobacco: Never Used  Substance and Sexual Activity  . Alcohol use: No  . Drug use: No  . Sexual activity: Not Currently  Lifestyle  . Physical activity    Days per week: Not on file    Minutes per session: Not on file  . Stress: Not on file  Relationships  . Social Herbalist on phone: Not on file    Gets together: Not on file    Attends religious service: Not on file    Active member of club or organization: Not on file    Attends meetings of clubs or organizations: Not on file    Relationship status: Not on file  . Intimate partner violence    Fear of current or ex partner:  Not on file    Emotionally abused: Not on file    Physically abused: Not on file    Forced sexual activity: Not on file  Other Topics Concern  . Not on file  Social History Narrative  . Not on file    Family History  Problem Relation Age of Onset  . Breast cancer Cousin        mat cousin  . Hypertension Mother   . Heart attack Father   . Prostate cancer Father   . Diabetes Father   . Colon polyps Father      Current Outpatient Medications:  .  acetaminophen (TYLENOL) 500 MG tablet, Take 500 mg by mouth every 6 (six) hours as needed (for pain/headaches.)., Disp: , Rfl:  .  aspirin EC 81 MG tablet, Take 81 mg by mouth daily., Disp: , Rfl:  .  atorvastatin (LIPITOR) 10 MG tablet, Take 10 mg by mouth daily. , Disp: , Rfl:  .  Calcium Carbonate-Vitamin  D 600-400 MG-UNIT tablet, Take 1 tablet by mouth daily. , Disp: , Rfl:  .  dexamethasone (DECADRON) 4 MG tablet, Take 2 tablets (8 mg total) by mouth 2 (two) times daily. Start the day before Taxotere. Then again the day after chemo for 3 days., Disp: 30 tablet, Rfl: 1 .  lidocaine-prilocaine (EMLA) cream, Apply to affected area once, Disp: 30 g, Rfl: 3 .  lisinopril-hydrochlorothiazide (PRINZIDE,ZESTORETIC) 20-12.5 MG tablet, Take 1 tablet by mouth daily., Disp: , Rfl:  .  Multiple Vitamin (MULTIVITAMIN WITH MINERALS) TABS tablet, Take 1 tablet by mouth daily after breakfast. Centrum Silver, Disp: , Rfl:  .  ondansetron (ZOFRAN) 8 MG tablet, Take 1 tablet (8 mg total) by mouth 2 (two) times daily as needed for refractory nausea / vomiting. Start on day 3 after chemo. (Patient not taking: Reported on 09/14/2019), Disp: 30 tablet, Rfl: 1 .  prochlorperazine (COMPAZINE) 10 MG tablet, Take 1 tablet (10 mg total) by mouth every 6 (six) hours as needed (Nausea or vomiting). (Patient not taking: Reported on 09/14/2019), Disp: 30 tablet, Rfl: 1  Physical exam:  Vitals:   09/15/19 0906  BP: (!) 147/52  Pulse: 99  Temp: 98.1 F (36.7 C)  TempSrc: Tympanic  Weight: 203 lb (92.1 kg)  Height: 5' 1"  (1.549 m)   Physical Exam Constitutional:      General: She is not in acute distress. HENT:     Head: Normocephalic and atraumatic.  Eyes:     Pupils: Pupils are equal, round, and reactive to light.  Neck:     Musculoskeletal: Normal range of motion.  Cardiovascular:     Rate and Rhythm: Normal rate and regular rhythm.     Heart sounds: Normal heart sounds.  Pulmonary:     Effort: Pulmonary effort is normal.     Breath sounds: Normal breath sounds.  Abdominal:     General: Bowel sounds are normal.     Palpations: Abdomen is soft.  Skin:    General: Skin is warm and dry.  Neurological:     Mental Status: She is alert and oriented to person, place, and time.      CMP Latest Ref Rng & Units  09/15/2019  Glucose 70 - 99 mg/dL 251(H)  BUN 8 - 23 mg/dL 16  Creatinine 0.44 - 1.00 mg/dL 0.92  Sodium 135 - 145 mmol/L 136  Potassium 3.5 - 5.1 mmol/L 3.7  Chloride 98 - 111 mmol/L 101  CO2 22 - 32 mmol/L 24  Calcium 8.9 - 10.3 mg/dL 9.3  Total Protein 6.5 - 8.1 g/dL 6.5  Total Bilirubin 0.3 - 1.2 mg/dL 0.4  Alkaline Phos 38 - 126 U/L 71  AST 15 - 41 U/L 24  ALT 0 - 44 U/L 19   CBC Latest Ref Rng & Units 09/15/2019  WBC 4.0 - 10.5 K/uL 15.3(H)  Hemoglobin 12.0 - 15.0 g/dL 9.2(L)  Hematocrit 36.0 - 46.0 % 27.1(L)  Platelets 150 - 400 K/uL 511(H)      Assessment and plan- Patient is a 68 y.o. female with invasive mammary carcinoma of the left breast pathological prognostic stage I [id number] ER PR positive HER-2/neu negative status post lumpectomy and sentinel lymph node biopsy.She is high risk mammprint score and s/p mastectomygiven concerns for residual disease.   She is here for on treatment assessment prior to cycle 3 of adjuvant TC chemotherapy  Counseling to proceed with cycle 3 of adjuvant TC chemotherapy today with on pro-Neulasta support.  She is receiving Taxotere at reduced dose of 60 mg per metered square.  She continues to have mild leukocytosis and thrombocytosis which is likely reactive and will continue to monitor.  She has evidence of normocytic anemia which is likely secondary to chemotherapy.  I did add on iron studies B12 and folate today which does not reveal any reversible cause of anemia.  I also mentioned to her that her blood sugars have been running high and today is 251.  She is not currently taking any diabetic medications.  If she continues to have high blood sugars after she completes chemotherapy she will need to be started on medications per her primary care doctor.  Patient verbalized understanding  I will see her back in 3 weeks time with CBC with differential, CMP for cycle 4 of adjuvant TC chemotherapy   Visit Diagnosis 1. Anemia,  unspecified type   2. Antineoplastic chemotherapy induced anemia   3. Malignant neoplasm of upper-outer quadrant of left breast in female, estrogen receptor positive ([place])   4. Encounter for antineoplastic chemotherapy   5. Hyperglycemia      Dr. Randa Evens, MD, MPH [facility] at [facility] [phone] 09/17/2019 12:27 PM

## 2019-10-02 ENCOUNTER — Telehealth: Payer: Self-pay | Admitting: *Deleted

## 2019-10-02 NOTE — Telephone Encounter (Signed)
Patient called reporting that she is having weakness and is asking if she should have an iron infusion before her next chemotherapy treatment Friday and that you told her that her iron was low at her last visit. Please advise.

## 2019-10-02 NOTE — Telephone Encounter (Signed)
Called patient to let her know what Dr. Elroy Channel recommendations were. Patient stated that she realized that she was tired because of her third chemo therapy. Patient stated that Hassan Rowan, RN had told her that her iron was low and that she might need an iron infusion. I then told her that she did not need one at this time but she could come in to see Dr. Janese Banks. Patient stated that she was over doing things at home and that she was fine in the sense that she was just tired and fatigued. Patient denied fever, chills, nausea, vomiting, constipation or diarrhea. Patient stated that she had an appointment on Friday and that she would come until then. Patient had no further questions or concerns.

## 2019-10-02 NOTE — Telephone Encounter (Signed)
Dr. Janese Banks can you please advise. Please let us know if she should be seen soon.

## 2019-10-02 NOTE — Telephone Encounter (Signed)
We can see her for possible fluids with labs cbc and cmp. She does not need IV iron. I can see her this afternoon or tomorrow

## 2019-10-06 ENCOUNTER — Inpatient Hospital Stay (HOSPITAL_BASED_OUTPATIENT_CLINIC_OR_DEPARTMENT_OTHER): Payer: Medicare Other | Admitting: Oncology

## 2019-10-06 ENCOUNTER — Inpatient Hospital Stay: Payer: Medicare Other | Attending: Oncology

## 2019-10-06 ENCOUNTER — Encounter: Payer: Self-pay | Admitting: Oncology

## 2019-10-06 ENCOUNTER — Other Ambulatory Visit: Payer: Self-pay

## 2019-10-06 ENCOUNTER — Other Ambulatory Visit: Payer: Self-pay | Admitting: *Deleted

## 2019-10-06 ENCOUNTER — Inpatient Hospital Stay: Payer: Medicare Other

## 2019-10-06 VITALS — BP 142/75 | HR 110 | Temp 98.7°F | Wt 203.0 lb

## 2019-10-06 DIAGNOSIS — C50412 Malignant neoplasm of upper-outer quadrant of left female breast: Secondary | ICD-10-CM

## 2019-10-06 DIAGNOSIS — D6481 Anemia due to antineoplastic chemotherapy: Secondary | ICD-10-CM

## 2019-10-06 DIAGNOSIS — T451X5A Adverse effect of antineoplastic and immunosuppressive drugs, initial encounter: Secondary | ICD-10-CM | POA: Diagnosis not present

## 2019-10-06 DIAGNOSIS — Z95828 Presence of other vascular implants and grafts: Secondary | ICD-10-CM

## 2019-10-06 DIAGNOSIS — Z7982 Long term (current) use of aspirin: Secondary | ICD-10-CM | POA: Diagnosis not present

## 2019-10-06 DIAGNOSIS — Z8042 Family history of malignant neoplasm of prostate: Secondary | ICD-10-CM | POA: Diagnosis not present

## 2019-10-06 DIAGNOSIS — Z90710 Acquired absence of both cervix and uterus: Secondary | ICD-10-CM | POA: Diagnosis not present

## 2019-10-06 DIAGNOSIS — E785 Hyperlipidemia, unspecified: Secondary | ICD-10-CM | POA: Insufficient documentation

## 2019-10-06 DIAGNOSIS — I10 Essential (primary) hypertension: Secondary | ICD-10-CM | POA: Insufficient documentation

## 2019-10-06 DIAGNOSIS — Z8249 Family history of ischemic heart disease and other diseases of the circulatory system: Secondary | ICD-10-CM | POA: Diagnosis not present

## 2019-10-06 DIAGNOSIS — Z803 Family history of malignant neoplasm of breast: Secondary | ICD-10-CM | POA: Diagnosis not present

## 2019-10-06 DIAGNOSIS — Z833 Family history of diabetes mellitus: Secondary | ICD-10-CM | POA: Insufficient documentation

## 2019-10-06 DIAGNOSIS — C50912 Malignant neoplasm of unspecified site of left female breast: Secondary | ICD-10-CM | POA: Diagnosis present

## 2019-10-06 DIAGNOSIS — Z79899 Other long term (current) drug therapy: Secondary | ICD-10-CM | POA: Diagnosis not present

## 2019-10-06 DIAGNOSIS — D649 Anemia, unspecified: Secondary | ICD-10-CM

## 2019-10-06 DIAGNOSIS — Z170 Estrogen receptor positive status [ER+]: Secondary | ICD-10-CM | POA: Insufficient documentation

## 2019-10-06 LAB — CBC WITH DIFFERENTIAL/PLATELET
Abs Immature Granulocytes: 1.09 10*3/uL — ABNORMAL HIGH (ref 0.00–0.07)
Basophils Absolute: 0 10*3/uL (ref 0.0–0.1)
Basophils Relative: 0 %
Eosinophils Absolute: 0 10*3/uL (ref 0.0–0.5)
Eosinophils Relative: 0 %
HCT: 21.6 % — ABNORMAL LOW (ref 36.0–46.0)
Hemoglobin: 7 g/dL — ABNORMAL LOW (ref 12.0–15.0)
Immature Granulocytes: 6 %
Lymphocytes Relative: 4 %
Lymphs Abs: 0.7 10*3/uL (ref 0.7–4.0)
MCH: 30 pg (ref 26.0–34.0)
MCHC: 32.4 g/dL (ref 30.0–36.0)
MCV: 92.7 fL (ref 80.0–100.0)
Monocytes Absolute: 0.5 10*3/uL (ref 0.1–1.0)
Monocytes Relative: 3 %
Neutro Abs: 15.7 10*3/uL — ABNORMAL HIGH (ref 1.7–7.7)
Neutrophils Relative %: 87 %
Platelets: 495 10*3/uL — ABNORMAL HIGH (ref 150–400)
RBC: 2.33 MIL/uL — ABNORMAL LOW (ref 3.87–5.11)
RDW: 18.3 % — ABNORMAL HIGH (ref 11.5–15.5)
WBC: 18.1 10*3/uL — ABNORMAL HIGH (ref 4.0–10.5)
nRBC: 0.4 % — ABNORMAL HIGH (ref 0.0–0.2)

## 2019-10-06 LAB — COMPREHENSIVE METABOLIC PANEL
ALT: 17 U/L (ref 0–44)
AST: 22 U/L (ref 15–41)
Albumin: 3.4 g/dL — ABNORMAL LOW (ref 3.5–5.0)
Alkaline Phosphatase: 58 U/L (ref 38–126)
Anion gap: 7 (ref 5–15)
BUN: 18 mg/dL (ref 8–23)
CO2: 25 mmol/L (ref 22–32)
Calcium: 9 mg/dL (ref 8.9–10.3)
Chloride: 103 mmol/L (ref 98–111)
Creatinine, Ser: 0.84 mg/dL (ref 0.44–1.00)
GFR calc Af Amer: >60 mL/min (ref >60)
GFR calc non Af Amer: >60 mL/min (ref >60)
Glucose, Bld: 252 mg/dL — ABNORMAL HIGH (ref 70–99)
Potassium: 3.6 mmol/L (ref 3.5–5.1)
Sodium: 135 mmol/L (ref 135–145)
Total Bilirubin: 0.4 mg/dL (ref 0.3–1.2)
Total Protein: 6 g/dL — ABNORMAL LOW (ref 6.5–8.1)

## 2019-10-06 LAB — PREPARE RBC (CROSSMATCH)

## 2019-10-06 LAB — ABO/RH: ABO/RH(D): A POS

## 2019-10-06 LAB — FERRITIN: Ferritin: 131 ng/mL (ref 11–307)

## 2019-10-06 MED ORDER — SODIUM CHLORIDE 0.9% IV SOLUTION
250.0000 mL | Freq: Once | INTRAVENOUS | Status: AC
  Administered 2019-10-06: 12:00:00 250 mL via INTRAVENOUS
  Filled 2019-10-06: qty 250

## 2019-10-06 MED ORDER — ACETAMINOPHEN 325 MG PO TABS: 650.0000 mg | ORAL_TABLET | Freq: Once | ORAL | Status: DC

## 2019-10-06 MED ORDER — SODIUM CHLORIDE 0.9% FLUSH
10.0000 mL | Freq: Once | INTRAVENOUS | Status: AC
  Administered 2019-10-06: 10 mL via INTRAVENOUS
  Filled 2019-10-06: qty 10

## 2019-10-06 MED ORDER — HEPARIN SOD (PORK) LOCK FLUSH 100 UNIT/ML IV SOLN
500.0000 [IU] | Freq: Every day | INTRAVENOUS | Status: AC | PRN
  Administered 2019-10-06: 500 [IU]
  Filled 2019-10-06: qty 5

## 2019-10-06 MED ORDER — SODIUM CHLORIDE 0.9% FLUSH
10.0000 mL | INTRAVENOUS | Status: AC | PRN
  Administered 2019-10-06: 15:00:00 10 mL
  Filled 2019-10-06: qty 10

## 2019-10-06 NOTE — Progress Notes (Signed)
Patient stated that she had been feeling tired and fatigued all the time.

## 2019-10-06 NOTE — Progress Notes (Signed)
Hematology/Oncology Consult note [facility]  Telephone:([phone] [phone]  Patient Care Team: Ezequiel Kayser, MD as PCP - General (Internal Medicine)   Name of the patient: Amanda Wade  [id number]  01-11-1951   Date of visit: 10/06/19  Diagnosis- pathological prognostic stage IA mpT1b mpN1aInvasive mammary carcinoma of the left breast status post lumpectomy and sentinel lymph node biopsy  Chief complaint/ Reason for visit-on treatment assessment prior to cycle 4 of adjuvant TC chemotherapy  Heme/Onc history:  Patient is a 68 year old postmenopausal female with a past medical history significant for hypertension hyperlipidemia among other medical problems. She recently underwent a screening mammogram on 04/28/2019 which showed possible mass in the left breast. This was followed by diagnostic mammogram and ultrasound which showed bilobar hypo-Echoic mass at the 2:30 position of the left breast measuring 0.5 x 0.6 x 1.1 cm. Hypoechoic mass at the 2 o'clock position as well. And another bilobed irregular hypoechoic mass at the 3 o'clock position measuring 4 x 5 x 6 mm. Patient had biopsy of all these 3 masses. The 3 o'clock position breast mass came back as invasive mammary carcinoma 6 mm, grade 1 strongly ER PR positive greater than 90% and HER-2/neu negative. 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 230 breast mass also came back as intermediate grade DCIS. There was also another mass noted in the left breast which was biopsied and was 5 mm grade 1 ER PR positive and HER-2/neu negative.  Patient was taken for lumpectomy and sentinel lymph node biopsy by Dr. Bary Castilla on 06/05/2019.Final pathology showed invasive mammary carcinoma grade 1 ER PR positive and HER-2/neu negative at 4 different biopsy sites with extensive DCIS predominantly micropapillary type. One lymph node was also involved with macro metastatic carcinoma and was negative  for extranodal extension  Patient's case was discussed at tumor board and there was a concern that there may be potentially more areas of abnormality given extensive DCIS and invasive cancer seen in multiple foci in the lumpectomy specimen. Plan was to proceed with bilateral MRI.Breast MRI showed a large area of clumped discontinued non-mass enhancement in the left upper breast spanning an area of 9.8 cm in AP dimension suspicious for residual DCIS or invasive carcinoma.Patient therefore proceeded with r left mastectomy which showed small area of residual DCIS but no evidence of invasive malignancy.  Patient also had a MammaPrint testing done on her lumpectomy specimen which came back as high risk group. 29% chance of recurrence without chemotherapy at 5 years. Greater than 12% chemo benefit and estimated >93% survival at 5 years with chemotherapy and hormone therapy   Interval history-reports feeling fatigued but denies other complaints.  No fever, cough, shortness of breath or sputum production.  ECOG PS- 1 Pain scale- 0 Opioid associated constipation- no  Review of systems- Review of Systems  Constitutional: Positive for malaise/fatigue. Negative for chills, fever and weight loss.  HENT: Negative for congestion, ear discharge and nosebleeds.   Eyes: Negative for blurred vision.  Respiratory: Negative for cough, hemoptysis, sputum production, shortness of breath and wheezing.   Cardiovascular: Negative for chest pain, palpitations, orthopnea and claudication.  Gastrointestinal: Negative for abdominal pain, blood in stool, constipation, diarrhea, heartburn, melena, nausea and vomiting.  Genitourinary: Negative for dysuria, flank pain, frequency, hematuria and urgency.  Musculoskeletal: Negative for back pain, joint pain and myalgias.  Skin: Negative for rash.  Neurological: Negative for dizziness, tingling, focal weakness, seizures, weakness and headaches.  Endo/Heme/Allergies: Does  not  bruise/bleed easily.  Psychiatric/Behavioral: Negative for depression and suicidal ideas. The patient does not have insomnia.       Allergies  Allergen Reactions   Shellfish Allergy Rash   Sulfa Antibiotics Rash     Past Medical History:  Diagnosis Date   Complication of anesthesia    Hyperlipidemia    Hypertension    Malignant neoplasm of upper-outer quadrant of left breast in female, estrogen receptor positive ([place]) 05/19/2019   PONV (postoperative nausea and vomiting)    Vertigo    intermittent especially lying on left side     Past Surgical History:  Procedure Laterality Date   ABDOMINAL HYSTERECTOMY  2004   complete   BREAST BIOPSY Left 05/05/2019   [facility]- ribbon clip   BREAST BIOPSY Left 05/16/2019   us-DCIS   BREAST BIOPSY Left 05/16/2019   [facility]- [facility] clip   BREAST BIOPSY WITH SENTINEL LYMPH NODE BIOPSY AND NEEDLE LOCALIZATION Left 06/05/2019   Procedure: BREAST BIOPSY WITH SENTINEL LYMPH NODE BIOPSY AND NEEDLE LOCALIZATION, LEFT, BRACKETING WIRE;  Surgeon: Robert Bellow, MD;  Location: [facility] ORS;  Service: General;  Laterality: Left;   BREAST CYST ASPIRATION Bilateral age 43   neg   CHOLECYSTECTOMY     COLONOSCOPY WITH PROPOFOL N/A 08/02/2018   Procedure: COLONOSCOPY WITH PROPOFOL;  Surgeon: [name], MD;  Location: [facility] ENDOSCOPY;  Service: Endoscopy;  Laterality: N/A;   DILATION AND CURETTAGE OF UTERUS     goiter surgery     OOPHORECTOMY     PORTACATH PLACEMENT Left 07/10/2019   Procedure: INSERTION PORT-A-CATH;  Surgeon: Jules Husbands, MD;  Location: [facility] ORS;  Service: General;  Laterality: Left;   SIMPLE MASTECTOMY WITH AXILLARY SENTINEL NODE BIOPSY Left 07/10/2019   Procedure: SIMPLE MASTECTOMY.; LEFT;  Surgeon: Jules Husbands, MD;  Location: [facility] ORS;  Service: General;  Laterality: Left;   THYROIDECTOMY, PARTIAL      Social History   Socioeconomic History   Marital status: Divorced    Spouse name: Not on file    Number of children: Not on file   Years of education: Not on file   Highest education level: Not on file  Occupational History   Not on file  Social Needs   Financial resource strain: Not on file   Food insecurity    Worry: Not on file    Inability: Not on file   Transportation needs    Medical: Not on file    Non-medical: Not on file  Tobacco Use   Smoking status: Never Smoker   Smokeless tobacco: Never Used  Substance and Sexual Activity   Alcohol use: No   Drug use: No   Sexual activity: Not Currently  Lifestyle   Physical activity    Days per week: Not on file    Minutes per session: Not on file   Stress: Not on file  Relationships   Social connections    Talks on phone: Not on file    Gets together: Not on file    Attends religious service: Not on file    Active member of club or organization: Not on file    Attends meetings of clubs or organizations: Not on file    Relationship status: Not on file   Intimate partner violence    Fear of current or ex partner: Not on file    Emotionally abused: Not on file    Physically abused: Not on file    Forced sexual activity: Not on file  Other Topics Concern   Not on file  Social History Narrative   Not on file    Family History  Problem Relation Age of Onset   Breast cancer Cousin        mat cousin   Hypertension Mother    Heart attack Father    Prostate cancer Father    Diabetes Father    Colon polyps Father      Current Outpatient Medications:    acetaminophen (TYLENOL) 500 MG tablet, Take 500 mg by mouth every 6 (six) hours as needed (for pain/headaches.)., Disp: , Rfl:    aspirin EC 81 MG tablet, Take 81 mg by mouth daily., Disp: , Rfl:    atorvastatin (LIPITOR) 10 MG tablet, Take 10 mg by mouth daily. , Disp: , Rfl:    Calcium Carbonate-Vitamin D 600-400 MG-UNIT tablet, Take 1 tablet by mouth daily. , Disp: , Rfl:    dexamethasone (DECADRON) 4 MG tablet, Take 2 tablets (8 mg  total) by mouth 2 (two) times daily. Start the day before Taxotere. Then again the day after chemo for 3 days., Disp: 30 tablet, Rfl: 1   lidocaine-prilocaine (EMLA) cream, Apply to affected area once, Disp: 30 g, Rfl: 3   lisinopril-hydrochlorothiazide (PRINZIDE,ZESTORETIC) 20-12.5 MG tablet, Take 1 tablet by mouth daily., Disp: , Rfl:    Multiple Vitamin (MULTIVITAMIN WITH MINERALS) TABS tablet, Take 1 tablet by mouth daily after breakfast. Centrum Silver, Disp: , Rfl:    ondansetron (ZOFRAN) 8 MG tablet, Take 1 tablet (8 mg total) by mouth 2 (two) times daily as needed for refractory nausea / vomiting. Start on day 3 after chemo., Disp: 30 tablet, Rfl: 1   prochlorperazine (COMPAZINE) 10 MG tablet, Take 1 tablet (10 mg total) by mouth every 6 (six) hours as needed (Nausea or vomiting)., Disp: 30 tablet, Rfl: 1 No current facility-administered medications for this visit.   Facility-Administered Medications Ordered in Other Visits:    acetaminophen (TYLENOL) tablet 650 mg, 650 mg, Oral, Once, Sindy Guadeloupe, MD   heparin lock flush 100 unit/mL, 500 Units, Intracatheter, Daily PRN, Sindy Guadeloupe, MD   sodium chloride flush (NS) 0.9 % injection 10 mL, 10 mL, Intracatheter, PRN, Sindy Guadeloupe, MD  Physical exam:  Vitals:   10/06/19 0928  BP: (!) 142/75  Pulse: (!) 110  Temp: 98.7 F (37.1 C)  TempSrc: Tympanic  Weight: 203 lb (92.1 kg)   Physical Exam Constitutional:      General: She is not in acute distress. HENT:     Head: Normocephalic and atraumatic.  Eyes:     Pupils: Pupils are equal, round, and reactive to light.  Neck:     Musculoskeletal: Normal range of motion.  Cardiovascular:     Rate and Rhythm: Normal rate and regular rhythm.     Heart sounds: Normal heart sounds.  Pulmonary:     Effort: Pulmonary effort is normal.     Breath sounds: Normal breath sounds.  Abdominal:     General: Bowel sounds are normal.     Palpations: Abdomen is soft.  Skin:     General: Skin is warm and dry.  Neurological:     Mental Status: She is alert and oriented to person, place, and time.      CMP Latest Ref Rng & Units 10/06/2019  Glucose 70 - 99 mg/dL 252(H)  BUN 8 - 23 mg/dL 18  Creatinine 0.44 - 1.00 mg/dL 0.84  Sodium 135 - 145 mmol/L 135  Potassium  3.5 - 5.1 mmol/L 3.6  Chloride 98 - 111 mmol/L 103  CO2 22 - 32 mmol/L 25  Calcium 8.9 - 10.3 mg/dL 9.0  Total Protein 6.5 - 8.1 g/dL 6.0(L)  Total Bilirubin 0.3 - 1.2 mg/dL 0.4  Alkaline Phos 38 - 126 U/L 58  AST 15 - 41 U/L 22  ALT 0 - 44 U/L 17   CBC Latest Ref Rng & Units 10/06/2019  WBC 4.0 - 10.5 K/uL 18.1(H)  Hemoglobin 12.0 - 15.0 g/dL 7.0(L)  Hematocrit 36.0 - 46.0 % 21.6(L)  Platelets 150 - 400 K/uL 495(H)     Assessment and plan- Patient is a 68 y.o. female with invasive mammary carcinoma of the left breast pathological prognostic stage I [id number] ER PR positive HER-2/neu negative status post lumpectomy and sentinel lymph node biopsy.She is high risk mammprint score and s/p mastectomygiven concerns for residual disease.    She is here for on treatment assessment prior to cycle 4 of adjuvant TC chemotherapy  Patient is significantly anemic today and her hemoglobin is dropped down to 7.  It was close to 12 prior to starting chemotherapy.  Iron studies B12 and folate are normal.  Suspect this is all anemia secondary to chemotherapy.  I will give her 1 unit of PRBC transfusion today.  I will hold off on giving her chemo today and since becoming close to Thanksgiving I will see her the week after Thanksgiving with CBC with differential, CMP for cycle 4 of adjuvant TC chemotherapy which will be her last chemo and hopefully by then her anemia will have improved as well   Visit Diagnosis 1. Anemia due to antineoplastic chemotherapy      Dr. Randa Evens, MD, MPH [facility] [phone] 10/06/2019 12:35 PM

## 2019-10-07 LAB — TYPE AND SCREEN
ABO/RH(D): A POS
Antibody Screen: NEGATIVE
Unit division: 0

## 2019-10-07 LAB — BPAM RBC
Blood Product Expiration Date: 202012112359
ISSUE DATE / TIME: 202011131238
Unit Type and Rh: 6200

## 2019-10-25 ENCOUNTER — Other Ambulatory Visit: Payer: Self-pay

## 2019-10-25 ENCOUNTER — Encounter: Payer: Self-pay | Admitting: Oncology

## 2019-10-25 DIAGNOSIS — C50412 Malignant neoplasm of upper-outer quadrant of left female breast: Secondary | ICD-10-CM

## 2019-10-25 DIAGNOSIS — Z170 Estrogen receptor positive status [ER+]: Secondary | ICD-10-CM

## 2019-10-25 MED ORDER — DEXAMETHASONE 4 MG PO TABS: 8.0000 mg | ORAL_TABLET | Freq: Two times a day (BID) | ORAL | 0 refills | Status: AC

## 2019-10-25 NOTE — Progress Notes (Signed)
Patient stated that she would be needing a refill on her Decadron. Patient also stated that she would like to know what is next in her treatment.

## 2019-10-26 ENCOUNTER — Other Ambulatory Visit: Payer: Self-pay

## 2019-10-26 ENCOUNTER — Inpatient Hospital Stay (HOSPITAL_BASED_OUTPATIENT_CLINIC_OR_DEPARTMENT_OTHER): Payer: Medicare Other | Admitting: Oncology

## 2019-10-26 ENCOUNTER — Inpatient Hospital Stay: Payer: Medicare Other | Attending: Oncology

## 2019-10-26 ENCOUNTER — Inpatient Hospital Stay: Payer: Medicare Other

## 2019-10-26 ENCOUNTER — Telehealth: Payer: Self-pay

## 2019-10-26 VITALS — BP 130/69 | Temp 98.0°F | Wt 200.7 lb

## 2019-10-26 DIAGNOSIS — Z8042 Family history of malignant neoplasm of prostate: Secondary | ICD-10-CM | POA: Diagnosis not present

## 2019-10-26 DIAGNOSIS — Z9012 Acquired absence of left breast and nipple: Secondary | ICD-10-CM | POA: Insufficient documentation

## 2019-10-26 DIAGNOSIS — C50412 Malignant neoplasm of upper-outer quadrant of left female breast: Secondary | ICD-10-CM

## 2019-10-26 DIAGNOSIS — Z803 Family history of malignant neoplasm of breast: Secondary | ICD-10-CM | POA: Insufficient documentation

## 2019-10-26 DIAGNOSIS — T451X5A Adverse effect of antineoplastic and immunosuppressive drugs, initial encounter: Secondary | ICD-10-CM | POA: Diagnosis not present

## 2019-10-26 DIAGNOSIS — Z9079 Acquired absence of other genital organ(s): Secondary | ICD-10-CM | POA: Insufficient documentation

## 2019-10-26 DIAGNOSIS — I10 Essential (primary) hypertension: Secondary | ICD-10-CM | POA: Diagnosis not present

## 2019-10-26 DIAGNOSIS — Z90710 Acquired absence of both cervix and uterus: Secondary | ICD-10-CM | POA: Diagnosis not present

## 2019-10-26 DIAGNOSIS — Z79899 Other long term (current) drug therapy: Secondary | ICD-10-CM | POA: Insufficient documentation

## 2019-10-26 DIAGNOSIS — Z5189 Encounter for other specified aftercare: Secondary | ICD-10-CM | POA: Diagnosis not present

## 2019-10-26 DIAGNOSIS — Z8249 Family history of ischemic heart disease and other diseases of the circulatory system: Secondary | ICD-10-CM | POA: Insufficient documentation

## 2019-10-26 DIAGNOSIS — Z170 Estrogen receptor positive status [ER+]: Secondary | ICD-10-CM | POA: Insufficient documentation

## 2019-10-26 DIAGNOSIS — Z7982 Long term (current) use of aspirin: Secondary | ICD-10-CM | POA: Insufficient documentation

## 2019-10-26 DIAGNOSIS — D6481 Anemia due to antineoplastic chemotherapy: Secondary | ICD-10-CM | POA: Diagnosis not present

## 2019-10-26 DIAGNOSIS — E785 Hyperlipidemia, unspecified: Secondary | ICD-10-CM | POA: Insufficient documentation

## 2019-10-26 DIAGNOSIS — Z5111 Encounter for antineoplastic chemotherapy: Secondary | ICD-10-CM | POA: Insufficient documentation

## 2019-10-26 DIAGNOSIS — Z90722 Acquired absence of ovaries, bilateral: Secondary | ICD-10-CM | POA: Insufficient documentation

## 2019-10-26 LAB — CBC WITH DIFFERENTIAL/PLATELET
Abs Immature Granulocytes: 0.14 10*3/uL — ABNORMAL HIGH (ref 0.00–0.07)
Basophils Absolute: 0 10*3/uL (ref 0.0–0.1)
Basophils Relative: 0 %
Eosinophils Absolute: 0 10*3/uL (ref 0.0–0.5)
Eosinophils Relative: 0 %
HCT: 30.4 % — ABNORMAL LOW (ref 36.0–46.0)
Hemoglobin: 9.8 g/dL — ABNORMAL LOW (ref 12.0–15.0)
Immature Granulocytes: 1 %
Lymphocytes Relative: 6 %
Lymphs Abs: 0.7 10*3/uL (ref 0.7–4.0)
MCH: 29.3 pg (ref 26.0–34.0)
MCHC: 32.2 g/dL (ref 30.0–36.0)
MCV: 90.7 fL (ref 80.0–100.0)
Monocytes Absolute: 0.2 10*3/uL (ref 0.1–1.0)
Monocytes Relative: 2 %
Neutro Abs: 9.6 10*3/uL — ABNORMAL HIGH (ref 1.7–7.7)
Neutrophils Relative %: 91 %
Platelets: 313 10*3/uL (ref 150–400)
RBC: 3.35 MIL/uL — ABNORMAL LOW (ref 3.87–5.11)
RDW: 14.3 % (ref 11.5–15.5)
WBC: 10.6 10*3/uL — ABNORMAL HIGH (ref 4.0–10.5)
nRBC: 0 % (ref 0.0–0.2)

## 2019-10-26 LAB — COMPREHENSIVE METABOLIC PANEL
ALT: 18 U/L (ref 0–44)
AST: 28 U/L (ref 15–41)
Albumin: 3.6 g/dL (ref 3.5–5.0)
Alkaline Phosphatase: 61 U/L (ref 38–126)
Anion gap: 12 (ref 5–15)
BUN: 18 mg/dL (ref 8–23)
CO2: 23 mmol/L (ref 22–32)
Calcium: 9 mg/dL (ref 8.9–10.3)
Chloride: 101 mmol/L (ref 98–111)
Creatinine, Ser: 0.85 mg/dL (ref 0.44–1.00)
GFR calc Af Amer: >60 mL/min (ref >60)
GFR calc non Af Amer: >60 mL/min (ref >60)
Glucose, Bld: 321 mg/dL — ABNORMAL HIGH (ref 70–99)
Potassium: 3.5 mmol/L (ref 3.5–5.1)
Sodium: 136 mmol/L (ref 135–145)
Total Bilirubin: 0.5 mg/dL (ref 0.3–1.2)
Total Protein: 6.7 g/dL (ref 6.5–8.1)

## 2019-10-26 MED ORDER — SODIUM CHLORIDE 0.9 % IV SOLN
60.0000 mg/m2 | Freq: Once | INTRAVENOUS | Status: AC
  Administered 2019-10-26: 120 mg via INTRAVENOUS
  Filled 2019-10-26: qty 12

## 2019-10-26 MED ORDER — PEGFILGRASTIM 6 MG/0.6ML ~~LOC~~ PSKT
6.0000 mg | PREFILLED_SYRINGE | Freq: Once | SUBCUTANEOUS | Status: AC
  Administered 2019-10-26: 6 mg via SUBCUTANEOUS
  Filled 2019-10-26: qty 0.6

## 2019-10-26 MED ORDER — PALONOSETRON HCL INJECTION 0.25 MG/5ML
0.2500 mg | Freq: Once | INTRAVENOUS | Status: AC
  Administered 2019-10-26: 0.25 mg via INTRAVENOUS
  Filled 2019-10-26: qty 5

## 2019-10-26 MED ORDER — SODIUM CHLORIDE 0.9 % IV SOLN
Freq: Once | INTRAVENOUS | Status: AC
  Administered 2019-10-26: 10:00:00 via INTRAVENOUS
  Filled 2019-10-26: qty 250

## 2019-10-26 MED ORDER — HEPARIN SOD (PORK) LOCK FLUSH 100 UNIT/ML IV SOLN
500.0000 [IU] | Freq: Once | INTRAVENOUS | Status: AC | PRN
  Administered 2019-10-26: 500 [IU]
  Filled 2019-10-26: qty 5

## 2019-10-26 MED ORDER — DEXAMETHASONE SODIUM PHOSPHATE 10 MG/ML IJ SOLN
10.0000 mg | Freq: Once | INTRAMUSCULAR | Status: AC
  Administered 2019-10-26: 10 mg via INTRAVENOUS
  Filled 2019-10-26: qty 1

## 2019-10-26 MED ORDER — SODIUM CHLORIDE 0.9 % IV SOLN
600.0000 mg/m2 | Freq: Once | INTRAVENOUS | Status: AC
  Administered 2019-10-26: 1200 mg via INTRAVENOUS
  Filled 2019-10-26: qty 60

## 2019-10-26 NOTE — Telephone Encounter (Signed)
Dr. Janese Banks is needing dental clearance from patient dentist-Dr. Blakenship. I went ahead and faxed a dental clearance letter so patient could start Zometa. Now awaiting on the dental clearance.

## 2019-10-27 ENCOUNTER — Other Ambulatory Visit: Payer: Self-pay

## 2019-10-27 ENCOUNTER — Telehealth: Payer: Self-pay | Admitting: *Deleted

## 2019-10-27 NOTE — Progress Notes (Signed)
Hematology/Oncology Consult note [facility]  [phone] [phone]  Patient Care Team: Ezequiel Kayser, MD as PCP - General (Internal Medicine)   Name of the patient: Amanda Wade  [id number]  1951/02/05   Date of visit: 10/27/19  Diagnosis- pathological prognostic stage IA mpT1b mpN1aInvasive mammary carcinoma of the left breast status post lumpectomy and sentinel lymph node biopsy  Chief complaint/ Reason for visit-on treatment assessment prior to cycle 4 of adjuvant TC chemotherapy  Heme/Onc history: Patient is a 68 year old postmenopausal female with a past medical history significant for hypertension hyperlipidemia among other medical problems. She recently underwent a screening mammogram on 04/28/2019 which showed possible mass in the left breast. This was followed by diagnostic mammogram and ultrasound which showed bilobar hypo-Echoic mass at the 2:30 position of the left breast measuring 0.5 x 0.6 x 1.1 cm. Hypoechoic mass at the 2 o'clock position as well. And another bilobed irregular hypoechoic mass at the 3 o'clock position measuring 4 x 5 x 6 mm. Patient had biopsy of all these 3 masses. The 3 o'clock position breast mass came back as invasive mammary carcinoma 6 mm, grade 1 strongly ER PR positive greater than 90% and HER-2/neu negative. 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 230 breast mass also came back as intermediate grade DCIS. There was also another mass noted in the left breast which was biopsied and was 5 mm grade 1 ER PR positive and HER-2/neu negative.  Patient was taken for lumpectomy and sentinel lymph node biopsy by Dr. Bary Castilla on 06/05/2019.Final pathology showed invasive mammary carcinoma grade 1 ER PR positive and HER-2/neu negative at 4 different biopsy sites with extensive DCIS predominantly micropapillary type. One lymph node was also involved with macro metastatic carcinoma and was negative  for extranodal extension  Patient's case was discussed at tumor board and there was a concern that there may be potentially more areas of abnormality given extensive DCIS and invasive cancer seen in multiple foci in the lumpectomy specimen. Plan was to proceed with bilateral MRI.Breast MRI showed a large area of clumped discontinued non-mass enhancement in the left upper breast spanning an area of 9.8 cm in AP dimension suspicious for residual DCIS or invasive carcinoma.Patient therefore proceeded with r left mastectomy which showed small area of residual DCIS but no evidence of invasive malignancy.  Patient also had a MammaPrint testing done on her lumpectomy specimen which came back as high risk group. 29% chance of recurrence without chemotherapy at 5 years. Greater than 12% chemo benefit and estimated >93% survival at 5 years with chemotherapy and hormone therapy   Interval history-patient feels remarkably better after her fourth cycle of chemotherapy was delayed due to anemia.  She feels more energetic.  She continues to have mild fatigue but denies other complaints at this time  ECOG PS- 1 Pain scale- 0 Opioid associated constipation- no  Review of systems- Review of Systems  Constitutional: Positive for malaise/fatigue. Negative for chills, fever and weight loss.  HENT: Negative for congestion, ear discharge and nosebleeds.   Eyes: Negative for blurred vision.  Respiratory: Negative for cough, hemoptysis, sputum production, shortness of breath and wheezing.   Cardiovascular: Negative for chest pain, palpitations, orthopnea and claudication.  Gastrointestinal: Negative for abdominal pain, blood in stool, constipation, diarrhea, heartburn, melena, nausea and vomiting.  Genitourinary: Negative for dysuria, flank pain, frequency, hematuria and urgency.  Musculoskeletal: Negative for back pain, joint pain and myalgias.  Skin: Negative for rash.  Neurological: Negative for dizziness,  tingling, focal weakness, seizures, weakness and headaches.  Endo/Heme/Allergies: Does not bruise/bleed easily.  Psychiatric/Behavioral: Negative for depression and suicidal ideas. The patient does not have insomnia.       Allergies  Allergen Reactions  . Shellfish Allergy Rash  . Sulfa Antibiotics Rash     Past Medical History:  Diagnosis Date  . Complication of anesthesia   . Hyperlipidemia   . Hypertension   . Malignant neoplasm of upper-outer quadrant of left breast in female, estrogen receptor positive ([place]) 05/19/2019  . PONV (postoperative nausea and vomiting)   . Vertigo    intermittent especially lying on left side     Past Surgical History:  Procedure Laterality Date  . ABDOMINAL HYSTERECTOMY  2004   complete  . BREAST BIOPSY Left 05/05/2019   [facility]- ribbon clip  . BREAST BIOPSY Left 05/16/2019   [facility]-DCIS  . BREAST BIOPSY Left 05/16/2019   [facility]- X clip  . BREAST BIOPSY WITH SENTINEL LYMPH NODE BIOPSY AND NEEDLE LOCALIZATION Left 06/05/2019   Procedure: BREAST BIOPSY WITH SENTINEL LYMPH NODE BIOPSY AND NEEDLE LOCALIZATION, LEFT, BRACKETING WIRE;  Surgeon: Robert Bellow, MD;  Location: [facility] ORS;  Service: General;  Laterality: Left;  . BREAST CYST ASPIRATION Bilateral age 68   neg  . CHOLECYSTECTOMY    . COLONOSCOPY WITH PROPOFOL N/A 08/02/2018   Procedure: COLONOSCOPY WITH PROPOFOL;  Surgeon: [name], MD;  Location: [facility] ENDOSCOPY;  Service: Endoscopy;  Laterality: N/A;  . DILATION AND CURETTAGE OF UTERUS    . goiter surgery    . OOPHORECTOMY    . PORTACATH PLACEMENT Left 07/10/2019   Procedure: INSERTION PORT-A-CATH;  Surgeon: Jules Husbands, MD;  Location: [facility] ORS;  Service: General;  Laterality: Left;  . SIMPLE MASTECTOMY WITH AXILLARY SENTINEL NODE BIOPSY Left 07/10/2019   Procedure: SIMPLE MASTECTOMY.; LEFT;  Surgeon: Jules Husbands, MD;  Location: [facility] ORS;  Service: General;  Laterality: Left;  . THYROIDECTOMY, PARTIAL      Social History    Socioeconomic History  . Marital status: Divorced    Spouse name: Not on file  . Number of children: Not on file  . Years of education: Not on file  . Highest education level: Not on file  Occupational History  . Not on file  Social Needs  . Financial resource strain: Not on file  . Food insecurity    Worry: Not on file    Inability: Not on file  . Transportation needs    Medical: Not on file    Non-medical: Not on file  Tobacco Use  . Smoking status: Never Smoker  . Smokeless tobacco: Never Used  Substance and Sexual Activity  . Alcohol use: No  . Drug use: No  . Sexual activity: Not Currently  Lifestyle  . Physical activity    Days per week: Not on file    Minutes per session: Not on file  . Stress: Not on file  Relationships  . Social Herbalist on phone: Not on file    Gets together: Not on file    Attends religious service: Not on file    Active member of club or organization: Not on file    Attends meetings of clubs or organizations: Not on file    Relationship status: Not on file  . Intimate partner violence    Fear of current or ex partner: Not on file    Emotionally abused: Not on file  Physically abused: Not on file    Forced sexual activity: Not on file  Other Topics Concern  . Not on file  Social History Narrative  . Not on file    Family History  Problem Relation Age of Onset  . Breast cancer Cousin        mat cousin  . Hypertension Mother   . Heart attack Father   . Prostate cancer Father   . Diabetes Father   . Colon polyps Father      Current Outpatient Medications:  .  aspirin EC 81 MG tablet, Take 81 mg by mouth daily., Disp: , Rfl:  .  atorvastatin (LIPITOR) 10 MG tablet, Take 10 mg by mouth daily. , Disp: , Rfl:  .  Calcium Carbonate-Vitamin D 600-400 MG-UNIT tablet, Take 1 tablet by mouth daily. , Disp: , Rfl:  .  lidocaine-prilocaine (EMLA) cream, Apply to affected area once, Disp: 30 g, Rfl: 3 .   lisinopril-hydrochlorothiazide (PRINZIDE,ZESTORETIC) 20-12.5 MG tablet, Take 1 tablet by mouth daily., Disp: , Rfl:  .  Multiple Vitamin (MULTIVITAMIN WITH MINERALS) TABS tablet, Take 1 tablet by mouth daily after breakfast. Centrum Silver, Disp: , Rfl:  .  ondansetron (ZOFRAN) 8 MG tablet, Take 1 tablet (8 mg total) by mouth 2 (two) times daily as needed for refractory nausea / vomiting. Start on day 3 after chemo., Disp: 30 tablet, Rfl: 1 .  prochlorperazine (COMPAZINE) 10 MG tablet, Take 1 tablet (10 mg total) by mouth every 6 (six) hours as needed (Nausea or vomiting)., Disp: 30 tablet, Rfl: 1 .  acetaminophen (TYLENOL) 500 MG tablet, Take 500 mg by mouth every 6 (six) hours as needed (for pain/headaches.)., Disp: , Rfl:  .  dexamethasone (DECADRON) 4 MG tablet, Take 2 tablets (8 mg total) by mouth 2 (two) times daily for 2 days. Start the day before Taxotere. Then again the day after chemo for 3 days., Disp: 8 tablet, Rfl: 0  Physical exam:  Vitals:   10/26/19 0920  BP: 130/69  Temp: 98 F (36.7 C)  TempSrc: Tympanic  Weight: 200 lb 11.2 oz (91 kg)   Physical Exam Constitutional:      General: She is not in acute distress. HENT:     Head: Normocephalic and atraumatic.  Eyes:     Pupils: Pupils are equal, round, and reactive to light.  Neck:     Musculoskeletal: Normal range of motion.  Cardiovascular:     Rate and Rhythm: Normal rate and regular rhythm.     Heart sounds: Normal heart sounds.  Pulmonary:     Effort: Pulmonary effort is normal.     Breath sounds: Normal breath sounds.  Abdominal:     General: Bowel sounds are normal.     Palpations: Abdomen is soft.  Skin:    General: Skin is warm and dry.  Neurological:     Mental Status: She is alert and oriented to person, place, and time.      CMP Latest Ref Rng & Units 10/26/2019  Glucose 70 - 99 mg/dL 321(H)  BUN 8 - 23 mg/dL 18  Creatinine 0.44 - 1.00 mg/dL 0.85  Sodium 135 - 145 mmol/L 136  Potassium 3.5 - 5.1  mmol/L 3.5  Chloride 98 - 111 mmol/L 101  CO2 22 - 32 mmol/L 23  Calcium 8.9 - 10.3 mg/dL 9.0  Total Protein 6.5 - 8.1 g/dL 6.7  Total Bilirubin 0.3 - 1.2 mg/dL 0.5  Alkaline Phos 38 - 126 U/L 61  AST 15 - 41 U/L 28  ALT 0 - 44 U/L 18   CBC Latest Ref Rng & Units 10/26/2019  WBC 4.0 - 10.5 K/uL 10.6(H)  Hemoglobin 12.0 - 15.0 g/dL 9.8(L)  Hematocrit 36.0 - 46.0 % 30.4(L)  Platelets 150 - 400 K/uL 313      Assessment and plan- Patient is a 68 y.o. female with invasive mammary carcinoma of the left breast pathological prognostic stage I [id number] ER PR positive HER-2/neu negative status post lumpectomy and sentinel lymph node biopsy.She is high risk mammprint score and s/p mastectomygiven concerns for residual disease.  She is here for on treatment assessment prior to cycle 4 of adjuvant TC chemotherapy  Patient was supposed to receive cycle 4 on 10/06/2019.  However she was significantly anemic and hemoglobin had dropped down to 7.  She received 1 unit of PRBC at that time.  Chemotherapy was postponed till after Thanksgiving.  Today her hemoglobin is better at 9.8 and she will therefore proceed with cycle 4 of adjuvant TC chemotherapy today with ongoing Neulasta support.  This would be her last treatment.  She will be getting port removal by Dr. Dahlia Byes  I will see her back in 3 weeks time with repeat CBC with differential, CMP and hold tube for possible transfusion.  Her counts should slowly recover over the next 2 to 3 months.  Given that she had 1+ lymph node I will refer her to radiation oncology for consideration for adjuvant radiation treatment.  I will discuss hormone therapy with her at next visit.  Patient would also benefit from adjuvant bisphosphonates Zometa every 6 months for 3 to 5 years.  Discussed risks and benefits of Zometa including all but not limited to fatigue, hypocalcemia and risk of osteonecrosis of the jaw.  Patient will need dental clearance prior to  starting bisphosphonates.  She has an appointment with them in January and we will send normal letter requesting for clearance to start Zometa.   Visit Diagnosis 1. Malignant neoplasm of upper-outer quadrant of left breast in female, estrogen receptor positive ([place])   2. Anemia due to antineoplastic chemotherapy   3. Encounter for antineoplastic chemotherapy      Dr. Randa Evens, MD, MPH [facility] [phone] 10/27/2019 8:44 AM

## 2019-10-27 NOTE — Telephone Encounter (Signed)
Patient called reporting that she sees a letter to her dentist in [place], but wants to know if it was sent to him or if she needs to get it to him. Please return her call [phone]

## 2019-10-27 NOTE — Telephone Encounter (Signed)
Please clarify with her. thanks

## 2019-10-31 NOTE — Telephone Encounter (Signed)
Dental clearance was received and it's in patient's Media tab. Dr. Janese Banks was notified.

## 2019-11-02 ENCOUNTER — Other Ambulatory Visit: Payer: Self-pay

## 2019-11-02 ENCOUNTER — Ambulatory Visit
Admission: RE | Admit: 2019-11-02 | Discharge: 2019-11-02 | Disposition: A | Discharge status: Home / Self Care | Payer: Medicare Other | Source: Ambulatory Visit | Attending: Radiation Oncology | Admitting: Radiation Oncology

## 2019-11-02 ENCOUNTER — Encounter: Payer: Self-pay | Admitting: Radiation Oncology

## 2019-11-02 VITALS — BP 121/61 | HR 94 | Temp 98.0°F | Resp 18 | Wt 197.5 lb

## 2019-11-02 DIAGNOSIS — Z170 Estrogen receptor positive status [ER+]: Secondary | ICD-10-CM | POA: Diagnosis not present

## 2019-11-02 DIAGNOSIS — Z79899 Other long term (current) drug therapy: Secondary | ICD-10-CM | POA: Insufficient documentation

## 2019-11-02 DIAGNOSIS — E785 Hyperlipidemia, unspecified: Secondary | ICD-10-CM | POA: Diagnosis not present

## 2019-11-02 DIAGNOSIS — I10 Essential (primary) hypertension: Secondary | ICD-10-CM | POA: Diagnosis not present

## 2019-11-02 DIAGNOSIS — Z86010 Personal history of colonic polyps: Secondary | ICD-10-CM | POA: Diagnosis not present

## 2019-11-02 DIAGNOSIS — Z803 Family history of malignant neoplasm of breast: Secondary | ICD-10-CM | POA: Diagnosis not present

## 2019-11-02 DIAGNOSIS — Z9012 Acquired absence of left breast and nipple: Secondary | ICD-10-CM | POA: Diagnosis not present

## 2019-11-02 DIAGNOSIS — C50412 Malignant neoplasm of upper-outer quadrant of left female breast: Secondary | ICD-10-CM | POA: Insufficient documentation

## 2019-11-02 DIAGNOSIS — Z7982 Long term (current) use of aspirin: Secondary | ICD-10-CM | POA: Diagnosis not present

## 2019-11-02 NOTE — Consult Note (Signed)
NEW PATIENT EVALUATION  Name: Amanda Wade  MRN: [id number]  Date:   11/02/2019     DOB: [birth year]   This 68 y.o. female patient presents to the clinic for initial evaluation of at least stage Ia (T1b N1 aM0) invasive mammary carcinoma left breast status post left modified radical mastectomy and adjuvant chemotherapy for ER/PR positive HER-2 negative invasive mammary carcinoma.  REFERRING PHYSICIAN: Sindy Guadeloupe, MD  CHIEF COMPLAINT:  Chief Complaint  Patient presents with  . Breast Cancer    Initial Eval    DIAGNOSIS: There were no encounter diagnoses.   PREVIOUS INVESTIGATIONS:  Mammogram ultrasound and MRI scans reviewed Clinical notes reviewed Pathology report reviewed  HPI: Patient is a 68 year old female who originally presented with an abnormal mammogram of her left breast in June 2020.  She had diagnostic mammograms ultrasound which showed multiple areas involvement including a 1.1 cm mass at the 2:30 position of the left breast another mass at the 2 o'clock position another mass at the 3 o'clock position measuring 4 x 5 x 6 mm.  Biopsy of all these areas showed a 3:00 mass invasive mammary carcinoma 6 mm grade 1 ER/PR positive.  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  Patient an MRI scan of her left breast showing a 9.8 cm area of abnormality in the left upper breast involving the outer and inner quadrant middle and posterior depth.  This prompted left modified radical mastectomy with single lymph node biopsy.  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  1 sentinel lymph node was positive for macro metastatic disease measuring 4 mm in greatest dimension no extranodal extension.  There was 1 cm of residual invasive carcinoma overall grade 1.  Margin was clear but close at 0.5 mm.  Lymphovascular invasion was present.  Patient had MammaPrint performed which came back as a high risk group 29% chance of recurrence  without chemotherapy at 5 years.  The patient underwent 4 cycles of TC which she is tolerated fairly well.  She did have some significant anemia requiring packed red cells.  She is seen today for consideration of adjuvant radiation therapy.  She is doing well she states her incision is well-healed.  She is having some slight tenderness in her left axilla which is understandable.  She specifically denies swelling in her left upper extremity cough or bone pain.  PLANNED TREATMENT REGIMEN: Left chest wall peripheral emphatic radiation  PAST MEDICAL HISTORY:  has a past medical history of Complication of anesthesia, Hyperlipidemia, Hypertension, Malignant neoplasm of upper-outer quadrant of left breast in female, estrogen receptor positive ([place]) (05/19/2019), PONV (postoperative nausea and vomiting), and Vertigo.    PAST SURGICAL HISTORY:  Past Surgical History:  Procedure Laterality Date  . ABDOMINAL HYSTERECTOMY  2004   complete  . BREAST BIOPSY Left 05/05/2019   [facility]- ribbon clip  . BREAST BIOPSY Left 05/16/2019   us-DCIS  . BREAST BIOPSY Left 05/16/2019   [facility]- X clip  . BREAST BIOPSY WITH SENTINEL LYMPH NODE BIOPSY AND NEEDLE LOCALIZATION Left 06/05/2019   Procedure: BREAST BIOPSY WITH SENTINEL LYMPH NODE BIOPSY AND NEEDLE LOCALIZATION, LEFT, BRACKETING WIRE;  Surgeon: Robert Bellow, MD;  Location: [facility] ORS;  Service: General;  Laterality: Left;  . BREAST CYST ASPIRATION Bilateral age 49   neg  . CHOLECYSTECTOMY    . COLONOSCOPY WITH PROPOFOL N/A 08/02/2018   Procedure: COLONOSCOPY WITH PROPOFOL;  Surgeon: [name], MD;  Location: [facility]  ENDOSCOPY;  Service: Endoscopy;  Laterality: N/A;  . DILATION AND CURETTAGE OF UTERUS    . goiter surgery    . OOPHORECTOMY    . PORTACATH PLACEMENT Left 07/10/2019   Procedure: INSERTION PORT-A-CATH;  Surgeon: Jules Husbands, MD;  Location: [facility] ORS;  Service: General;  Laterality: Left;  . SIMPLE MASTECTOMY WITH AXILLARY SENTINEL NODE BIOPSY  Left 07/10/2019   Procedure: SIMPLE MASTECTOMY.; LEFT;  Surgeon: Jules Husbands, MD;  Location: [facility] ORS;  Service: General;  Laterality: Left;  . THYROIDECTOMY, PARTIAL      FAMILY HISTORY: family history includes Breast cancer in her cousin; Colon polyps in her father; Diabetes in her father; Heart attack in her father; Hypertension in her mother; Prostate cancer in her father.  SOCIAL HISTORY:  reports that she has never smoked. She has never used smokeless tobacco. She reports that she does not drink alcohol or use drugs.  ALLERGIES: Shellfish allergy and Sulfa antibiotics  MEDICATIONS:  Current Outpatient Medications  Medication Sig Dispense Refill  . acetaminophen (TYLENOL) 500 MG tablet Take 500 mg by mouth every 6 (six) hours as needed (for pain/headaches.).    Marland Kitchen aspirin EC 81 MG tablet Take 81 mg by mouth daily.    Marland Kitchen atorvastatin (LIPITOR) 10 MG tablet Take 10 mg by mouth daily.     . Calcium Carbonate-Vitamin D 600-400 MG-UNIT tablet Take 1 tablet by mouth daily.     Marland Kitchen lidocaine-prilocaine (EMLA) cream Apply to affected area once 30 g 3  . lisinopril-hydrochlorothiazide (PRINZIDE,ZESTORETIC) 20-12.5 MG tablet Take 1 tablet by mouth daily.    . Multiple Vitamin (MULTIVITAMIN WITH MINERALS) TABS tablet Take 1 tablet by mouth daily after breakfast. Centrum Silver    . ondansetron (ZOFRAN) 8 MG tablet Take 1 tablet (8 mg total) by mouth 2 (two) times daily as needed for refractory nausea / vomiting. Start on day 3 after chemo. 30 tablet 1  . prochlorperazine (COMPAZINE) 10 MG tablet Take 1 tablet (10 mg total) by mouth every 6 (six) hours as needed (Nausea or vomiting). 30 tablet 1   No current facility-administered medications for this encounter.    ECOG PERFORMANCE STATUS:  0 - Asymptomatic  REVIEW OF SYSTEMS: Patient denies any weight loss, fatigue, weakness, fever, chills or night sweats. Patient denies any loss of vision, blurred vision. Patient denies any ringing  of the ears  or hearing loss. No irregular heartbeat. Patient denies heart murmur or history of fainting. Patient denies any chest pain or pain radiating to her upper extremities. Patient denies any shortness of breath, difficulty breathing at night, cough or hemoptysis. Patient denies any swelling in the lower legs. Patient denies any nausea vomiting, vomiting of blood, or coffee ground material in the vomitus. Patient denies any stomach pain. Patient states has had normal bowel movements no significant constipation or diarrhea. Patient denies any dysuria, hematuria or significant nocturia. Patient denies any problems walking, swelling in the joints or loss of balance. Patient denies any skin changes, loss of hair or loss of weight. Patient denies any excessive worrying or anxiety or significant depression. Patient denies any problems with insomnia. Patient denies excessive thirst, polyuria, polydipsia. Patient denies any swollen glands, patient denies easy bruising or easy bleeding. Patient denies any recent infections, allergies or URI. Patient "s visual fields have not changed significantly in recent time.   PHYSICAL EXAM: There were no vitals taken for this visit. Patient status post left modified radical mastectomy chest wall is well-healed no evidence of mass  or nodularity in the mastectomy site is noted.  Right breast is free of dominant mass or nodularity.  No axillary or supraclavicular adenopathy is appreciated.  Well-developed well-nourished patient in NAD. HEENT reveals PERLA, EOMI, discs not visualized.  Oral cavity is clear. No oral mucosal lesions are identified. Neck is clear without evidence of cervical or supraclavicular adenopathy. Lungs are clear to A&P. Cardiac examination is essentially unremarkable with regular rate and rhythm without murmur rub or thrill. Abdomen is benign with no organomegaly or masses noted. Motor sensory and DTR levels are equal and symmetric in the upper and lower extremities.  Cranial nerves II through XII are grossly intact. Proprioception is intact. No peripheral adenopathy or edema is identified. No motor or sensory levels are noted. Crude visual fields are within normal range.  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  I will plan on delivering 5040 cGy in 28 fractions to the left chest wall and peripheral lymphatics would boost her scar another 1400 cGy using electron beam.  Risks and benefits of treatment including skin reaction fatigue alteration of blood counts slight chance of lymphedema of her left upper extremity inclusion of superficial lung all were discussed in detail with the patient.  I have recommended exercise her left upper extremity to try to prevent lymphedema.  I have personally set up and ordered CT simulation for next week.  Patient comprehends my treatment plan well.  She will also benefit from antiestrogen therapy after completion of radiation.  I would like to take this opportunity to thank you for allowing me to participate in the care of your patient.[name], MD

## 2019-11-07 ENCOUNTER — Other Ambulatory Visit: Payer: Self-pay

## 2019-11-07 ENCOUNTER — Ambulatory Visit
Admission: RE | Admit: 2019-11-07 | Discharge: 2019-11-07 | Disposition: A | Discharge status: Home / Self Care | Payer: Medicare Other | Source: Ambulatory Visit | Attending: Radiation Oncology | Admitting: Radiation Oncology

## 2019-11-07 DIAGNOSIS — C50412 Malignant neoplasm of upper-outer quadrant of left female breast: Secondary | ICD-10-CM | POA: Insufficient documentation

## 2019-11-07 DIAGNOSIS — Z170 Estrogen receptor positive status [ER+]: Secondary | ICD-10-CM | POA: Insufficient documentation

## 2019-11-07 DIAGNOSIS — Z510 Encounter for antineoplastic radiation therapy: Secondary | ICD-10-CM | POA: Diagnosis present

## 2019-11-08 ENCOUNTER — Ambulatory Visit: Payer: Medicare Other | Admitting: Surgery

## 2019-11-09 ENCOUNTER — Ambulatory Visit
Admission: RE | Admit: 2019-11-09 | Discharge: 2019-11-09 | Disposition: A | Discharge status: Home / Self Care | Payer: Medicare Other | Source: Ambulatory Visit | Attending: Oncology | Admitting: Oncology

## 2019-11-09 DIAGNOSIS — C50412 Malignant neoplasm of upper-outer quadrant of left female breast: Secondary | ICD-10-CM | POA: Diagnosis present

## 2019-11-09 DIAGNOSIS — Z170 Estrogen receptor positive status [ER+]: Secondary | ICD-10-CM | POA: Diagnosis present

## 2019-11-09 DIAGNOSIS — Z510 Encounter for antineoplastic radiation therapy: Secondary | ICD-10-CM | POA: Diagnosis not present

## 2019-11-09 HISTORY — DX: Personal history of antineoplastic chemotherapy: Z92.21

## 2019-11-10 ENCOUNTER — Other Ambulatory Visit: Payer: Self-pay | Admitting: *Deleted

## 2019-11-10 DIAGNOSIS — C50412 Malignant neoplasm of upper-outer quadrant of left female breast: Secondary | ICD-10-CM

## 2019-11-13 ENCOUNTER — Other Ambulatory Visit: Payer: Self-pay

## 2019-11-13 ENCOUNTER — Encounter: Payer: Self-pay | Admitting: Surgery

## 2019-11-13 ENCOUNTER — Ambulatory Visit (INDEPENDENT_AMBULATORY_CARE_PROVIDER_SITE_OTHER): Payer: Medicare Other | Admitting: Surgery

## 2019-11-13 VITALS — BP 117/70 | HR 90 | Temp 97.2°F | Ht 61.0 in | Wt 202.0 lb

## 2019-11-13 DIAGNOSIS — C50412 Malignant neoplasm of upper-outer quadrant of left female breast: Secondary | ICD-10-CM | POA: Diagnosis not present

## 2019-11-13 DIAGNOSIS — Z170 Estrogen receptor positive status [ER+]: Secondary | ICD-10-CM | POA: Diagnosis not present

## 2019-11-13 NOTE — Progress Notes (Signed)
Outpatient Surgical Follow Up  11/13/2019  Amanda Wade is an 68 y.o. female.   Chief Complaint  Patient presents with  . Follow-up     3 month follow up mastectomy , discuss port removal.    HPI: 68 year old female with mammary carcinoma extensive DCIS status post left mastectomy sentinel lymph node biopsy and just completed adjuvant chemotherapy.  She is doing very well from a mastectomy perspective and notes no pain.  No fevers no chills.  She is looking forward to starting her radiation therapy next week.   Past Medical History:  Diagnosis Date  . Complication of anesthesia   . Hyperlipidemia   . Hypertension   . Malignant neoplasm of upper-outer quadrant of left breast in female, estrogen receptor positive ([place]) 05/19/2019  . Personal history of chemotherapy   . PONV (postoperative nausea and vomiting)   . Vertigo    intermittent especially lying on left side    Past Surgical History:  Procedure Laterality Date  . ABDOMINAL HYSTERECTOMY  2004   complete  . BREAST BIOPSY Left 05/05/2019   [facility]- ribbon clip  . BREAST BIOPSY Left 05/16/2019   us-DCIS  . BREAST BIOPSY Left 05/16/2019   [facility]- X clip  . BREAST BIOPSY WITH SENTINEL LYMPH NODE BIOPSY AND NEEDLE LOCALIZATION Left 06/05/2019   Procedure: BREAST BIOPSY WITH SENTINEL LYMPH NODE BIOPSY AND NEEDLE LOCALIZATION, LEFT, BRACKETING WIRE;  Surgeon: Robert Bellow, MD;  Location: [facility] ORS;  Service: General;  Laterality: Left;  . BREAST CYST ASPIRATION Bilateral age 38   neg  . CHOLECYSTECTOMY    . COLONOSCOPY WITH PROPOFOL N/A 08/02/2018   Procedure: COLONOSCOPY WITH PROPOFOL;  Surgeon: [name], MD;  Location: [facility] ENDOSCOPY;  Service: Endoscopy;  Laterality: N/A;  . DILATION AND CURETTAGE OF UTERUS    . goiter surgery    . MASTECTOMY Left   . OOPHORECTOMY    . PORTACATH PLACEMENT Left 07/10/2019   Procedure: INSERTION PORT-A-CATH;  Surgeon: Jules Husbands, MD;  Location: [facility] ORS;  Service: General;   Laterality: Left;  . SIMPLE MASTECTOMY WITH AXILLARY SENTINEL NODE BIOPSY Left 07/10/2019   Procedure: SIMPLE MASTECTOMY.; LEFT;  Surgeon: Jules Husbands, MD;  Location: [facility] ORS;  Service: General;  Laterality: Left;  . THYROIDECTOMY, PARTIAL      Family History  Problem Relation Age of Onset  . Breast cancer Cousin        mat cousin  . Hypertension Mother   . Heart attack Father   . Prostate cancer Father   . Diabetes Father   . Colon polyps Father     Social History:  reports that she has never smoked. She has never used smokeless tobacco. She reports that she does not drink alcohol or use drugs.  Allergies:  Allergies  Allergen Reactions  . Shellfish Allergy Rash  . Sulfa Antibiotics Rash    Medications reviewed.    ROS Full ROS performed and is otherwise negative other than what is stated in HPI   BP 117/70   Pulse 90   Temp (!) 97.2 F (36.2 C) (Temporal)   Ht 5' 1"  (1.549 m)   Wt 202 lb (91.6 kg)   SpO2 97%   BMI 38.17 kg/m   Physical Exam Vitals and nursing note reviewed. Exam conducted with a chaperone present.  Constitutional:      General: She is not in acute distress.    Appearance: Normal appearance. She is normal weight.  Eyes:  General:        Right eye: No discharge.        Left eye: No discharge.  Pulmonary:     Effort: Pulmonary effort is normal.     Breath sounds: Normal breath sounds.     Comments: Breast: Left mastectomy scar, no new chest wall masses no LAD, no new breast lesions on the Right. Abdominal:     General: Abdomen is flat. There is no distension.     Palpations: There is no mass.     Tenderness: There is no abdominal tenderness. There is no guarding or rebound.     Hernia: No hernia is present.  Skin:    General: Skin is warm and dry.     Capillary Refill: Capillary refill takes less than 2 seconds.  Neurological:     General: No focal deficit present.     Mental Status: She is alert and oriented to person, place,  and time.  Psychiatric:        Mood and Affect: Mood normal.        Behavior: Behavior normal.        Thought Content: Thought content normal.        Judgment: Judgment normal.        Assessment/Plan: 68 year old female with mammary carcinoma extensive DCIS status post left mastectomy sentinel lymph node biopsy and just completed adjuvant chemotherapy.  She will start radiation next week.  Her main concern was Covid 19 epidemic. And how to handle possible contacts.  Extensive discussion with the patient was done.RTC in 6 months .  Greater than 50% of the 25 minutes  visit was spent in counseling/coordination of care   Caroleen Hamman, MD [place]

## 2019-11-13 NOTE — Patient Instructions (Signed)
   Follow-up with our office as needed.  Please call and ask to speak with a nurse if you develop questions or concerns.  

## 2019-11-14 ENCOUNTER — Other Ambulatory Visit: Payer: Self-pay

## 2019-11-14 ENCOUNTER — Ambulatory Visit
Admission: RE | Admit: 2019-11-14 | Discharge: 2019-11-14 | Disposition: A | Discharge status: Home / Self Care | Payer: Medicare Other | Source: Ambulatory Visit | Attending: Radiation Oncology | Admitting: Radiation Oncology

## 2019-11-14 DIAGNOSIS — Z510 Encounter for antineoplastic radiation therapy: Secondary | ICD-10-CM | POA: Diagnosis not present

## 2019-11-15 ENCOUNTER — Other Ambulatory Visit: Payer: Self-pay

## 2019-11-15 ENCOUNTER — Ambulatory Visit
Admission: RE | Admit: 2019-11-15 | Discharge: 2019-11-15 | Disposition: A | Discharge status: Home / Self Care | Payer: Medicare Other | Source: Ambulatory Visit | Attending: Radiation Oncology | Admitting: Radiation Oncology

## 2019-11-15 DIAGNOSIS — Z510 Encounter for antineoplastic radiation therapy: Secondary | ICD-10-CM | POA: Diagnosis not present

## 2019-11-16 ENCOUNTER — Other Ambulatory Visit: Payer: Self-pay

## 2019-11-16 ENCOUNTER — Other Ambulatory Visit: Payer: Medicare Other

## 2019-11-16 ENCOUNTER — Ambulatory Visit
Admission: RE | Admit: 2019-11-16 | Discharge: 2019-11-16 | Disposition: A | Discharge status: Home / Self Care | Payer: Medicare Other | Source: Ambulatory Visit | Attending: Radiation Oncology | Admitting: Radiation Oncology

## 2019-11-16 DIAGNOSIS — Z510 Encounter for antineoplastic radiation therapy: Secondary | ICD-10-CM | POA: Diagnosis not present

## 2019-11-19 ENCOUNTER — Telehealth: Payer: Self-pay | Admitting: Hematology and Oncology

## 2019-11-19 NOTE — Telephone Encounter (Signed)
Re: ankle edema  Patient notes bilateral ankle edema with increased activity over the past week.  She notes fatigue and shortness of breath with exertion associated with her anemia.  She is scheduled for PRBC transfusion tomorrow,  She denies any edema above her ankles, pleuritic chest pain or hemoptysis.  Discuss follow-up in clinic tomorrow unless any concerning symptoms.   Lequita Asal, MD

## 2019-11-20 ENCOUNTER — Encounter: Payer: Self-pay | Admitting: Oncology

## 2019-11-20 ENCOUNTER — Ambulatory Visit
Admission: RE | Admit: 2019-11-20 | Discharge: 2019-11-20 | Disposition: A | Discharge status: Home / Self Care | Payer: Medicare Other | Source: Ambulatory Visit | Attending: Oncology | Admitting: Oncology

## 2019-11-20 ENCOUNTER — Inpatient Hospital Stay: Payer: Medicare Other

## 2019-11-20 ENCOUNTER — Ambulatory Visit: Payer: Medicare Other

## 2019-11-20 ENCOUNTER — Telehealth: Payer: Medicare Other | Admitting: Oncology

## 2019-11-20 ENCOUNTER — Other Ambulatory Visit: Payer: Self-pay

## 2019-11-20 ENCOUNTER — Inpatient Hospital Stay (HOSPITAL_BASED_OUTPATIENT_CLINIC_OR_DEPARTMENT_OTHER): Payer: Medicare Other | Admitting: Oncology

## 2019-11-20 ENCOUNTER — Ambulatory Visit
Admission: RE | Admit: 2019-11-20 | Discharge: 2019-11-20 | Disposition: A | Discharge status: Home / Self Care | Payer: Medicare Other | Source: Ambulatory Visit | Attending: Radiation Oncology | Admitting: Radiation Oncology

## 2019-11-20 ENCOUNTER — Telehealth: Payer: Self-pay

## 2019-11-20 VITALS — BP 115/91 | HR 87 | Temp 98.4°F | Ht 61.0 in | Wt 205.0 lb

## 2019-11-20 DIAGNOSIS — R600 Localized edema: Secondary | ICD-10-CM | POA: Diagnosis present

## 2019-11-20 DIAGNOSIS — D6481 Anemia due to antineoplastic chemotherapy: Secondary | ICD-10-CM

## 2019-11-20 DIAGNOSIS — T451X5A Adverse effect of antineoplastic and immunosuppressive drugs, initial encounter: Secondary | ICD-10-CM

## 2019-11-20 DIAGNOSIS — Z7189 Other specified counseling: Secondary | ICD-10-CM | POA: Diagnosis not present

## 2019-11-20 DIAGNOSIS — Z170 Estrogen receptor positive status [ER+]: Secondary | ICD-10-CM

## 2019-11-20 DIAGNOSIS — C50412 Malignant neoplasm of upper-outer quadrant of left female breast: Secondary | ICD-10-CM

## 2019-11-20 DIAGNOSIS — Z5111 Encounter for antineoplastic chemotherapy: Secondary | ICD-10-CM | POA: Diagnosis not present

## 2019-11-20 DIAGNOSIS — Z510 Encounter for antineoplastic radiation therapy: Secondary | ICD-10-CM | POA: Diagnosis not present

## 2019-11-20 LAB — SAMPLE TO BLOOD BANK

## 2019-11-20 LAB — CBC WITH DIFFERENTIAL/PLATELET
Abs Immature Granulocytes: 0.04 10*3/uL (ref 0.00–0.07)
Basophils Absolute: 0 10*3/uL (ref 0.0–0.1)
Basophils Relative: 1 %
Eosinophils Absolute: 0.5 10*3/uL (ref 0.0–0.5)
Eosinophils Relative: 6 %
HCT: 28.4 % — ABNORMAL LOW (ref 36.0–46.0)
Hemoglobin: 9.2 g/dL — ABNORMAL LOW (ref 12.0–15.0)
Immature Granulocytes: 1 %
Lymphocytes Relative: 17 %
Lymphs Abs: 1.2 10*3/uL (ref 0.7–4.0)
MCH: 29.4 pg (ref 26.0–34.0)
MCHC: 32.4 g/dL (ref 30.0–36.0)
MCV: 90.7 fL (ref 80.0–100.0)
Monocytes Absolute: 0.5 10*3/uL (ref 0.1–1.0)
Monocytes Relative: 7 %
Neutro Abs: 5 10*3/uL (ref 1.7–7.7)
Neutrophils Relative %: 68 %
Platelets: 323 10*3/uL (ref 150–400)
RBC: 3.13 MIL/uL — ABNORMAL LOW (ref 3.87–5.11)
RDW: 15.2 % (ref 11.5–15.5)
WBC: 7.3 10*3/uL (ref 4.0–10.5)
nRBC: 0 % (ref 0.0–0.2)

## 2019-11-20 LAB — COMPREHENSIVE METABOLIC PANEL
ALT: 22 U/L (ref 0–44)
AST: 25 U/L (ref 15–41)
Albumin: 3.4 g/dL — ABNORMAL LOW (ref 3.5–5.0)
Alkaline Phosphatase: 59 U/L (ref 38–126)
Anion gap: 10 (ref 5–15)
BUN: 12 mg/dL (ref 8–23)
CO2: 28 mmol/L (ref 22–32)
Calcium: 9.5 mg/dL (ref 8.9–10.3)
Chloride: 103 mmol/L (ref 98–111)
Creatinine, Ser: 0.68 mg/dL (ref 0.44–1.00)
GFR calc Af Amer: >60 mL/min (ref >60)
GFR calc non Af Amer: >60 mL/min (ref >60)
Glucose, Bld: 142 mg/dL — ABNORMAL HIGH (ref 70–99)
Potassium: 3.6 mmol/L (ref 3.5–5.1)
Sodium: 141 mmol/L (ref 135–145)
Total Bilirubin: 0.5 mg/dL (ref 0.3–1.2)
Total Protein: 6.3 g/dL — ABNORMAL LOW (ref 6.5–8.1)

## 2019-11-20 MED ORDER — HEPARIN SOD (PORK) LOCK FLUSH 100 UNIT/ML IV SOLN
500.0000 [IU] | Freq: Once | INTRAVENOUS | Status: AC
  Administered 2019-11-20: 11:00:00 500 [IU] via INTRAVENOUS
  Filled 2019-11-20: qty 5

## 2019-11-20 MED ORDER — ANASTROZOLE 1 MG PO TABS: 1.0000 mg | ORAL_TABLET | Freq: Every day | ORAL | 2 refills | Status: DC

## 2019-11-20 NOTE — Telephone Encounter (Signed)
[name] from U/S department called to let Dr. Janese Banks know that patient's U/S was negative for DVT. Dr. Janese Banks was notified.

## 2019-11-20 NOTE — Telephone Encounter (Signed)
Pt was added to our schedule today. thanks

## 2019-11-20 NOTE — Progress Notes (Signed)
Patient stated that her bilateral edema on her ankles and feet for the past week.

## 2019-11-21 ENCOUNTER — Ambulatory Visit
Admission: RE | Admit: 2019-11-21 | Discharge: 2019-11-21 | Disposition: A | Discharge status: Home / Self Care | Payer: Medicare Other | Source: Ambulatory Visit | Attending: Radiation Oncology | Admitting: Radiation Oncology

## 2019-11-21 ENCOUNTER — Telehealth: Payer: Self-pay | Admitting: *Deleted

## 2019-11-21 ENCOUNTER — Inpatient Hospital Stay: Payer: Medicare Other | Admitting: Oncology

## 2019-11-21 ENCOUNTER — Other Ambulatory Visit: Payer: Self-pay

## 2019-11-21 DIAGNOSIS — Z510 Encounter for antineoplastic radiation therapy: Secondary | ICD-10-CM | POA: Diagnosis not present

## 2019-11-21 NOTE — Progress Notes (Signed)
Hematology/Oncology Consult note [facility]  Telephone:([phone] [phone]  Patient Care Team: Ezequiel Kayser, MD as PCP - General (Internal Medicine)   Name of the patient: Amanda Wade  [id number]  November 24, 1950   Date of visit: 11/21/19  Diagnosis-  pathological prognostic stage IA [id number] mammary carcinoma of the left breast status post lumpectomy and sentinel lymph node biopsy  Chief complaint/ Reason for visit-acute visit for leg swelling  Heme/Onc history: Patient is a 68 year old postmenopausal female with a past medical history significant for hypertension hyperlipidemia among other medical problems. She recently underwent a screening mammogram on 04/28/2019 which showed possible mass in the left breast. This was followed by diagnostic mammogram and ultrasound which showed bilobar hypo-Echoic mass at the 2:30 position of the left breast measuring 0.5 x 0.6 x 1.1 cm. Hypoechoic mass at the 2 o'clock position as well. And another bilobed irregular hypoechoic mass at the 3 o'clock position measuring 4 x 5 x 6 mm. Patient had biopsy of all these 3 masses. The 3 o'clock position breast mass came back as invasive mammary carcinoma 6 mm, grade 1 strongly ER PR positive greater than 90% and HER-2/neu negative. 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 230 breast mass also came back as intermediate grade DCIS. There was also another mass noted in the left breast which was biopsied and was 5 mm grade 1 ER PR positive and HER-2/neu negative.  Patient was taken for lumpectomy and sentinel lymph node biopsy by Dr. Bary Castilla on 06/05/2019.Final pathology showed invasive mammary carcinoma grade 1 ER PR positive and HER-2/neu negative at 4 different biopsy sites with extensive DCIS predominantly micropapillary type. One lymph node was also involved with macro metastatic carcinoma and was negative for extranodal extension  Patient's  case was discussed at tumor board and there was a concern that there may be potentially more areas of abnormality given extensive DCIS and invasive cancer seen in multiple foci in the lumpectomy specimen. Plan was to proceed with bilateral MRI.Breast MRI showed a large area of clumped discontinued non-mass enhancement in the left upper breast spanning an area of 9.8 cm in AP dimension suspicious for residual DCIS or invasive carcinoma.Patient therefore proceeded with r left mastectomy which showed small area of residual DCIS but no evidence of invasive malignancy.  Patient also had a MammaPrint testing done on her lumpectomy specimen which came back as high risk group. 29% chance of recurrence without chemotherapy at 5 years. Greater than 12% chemo benefit and estimated >93% survival at 5 years with chemotherapy and hormone therapy  Patient completed 4 cycles of adjuvant TC chemotherapy on 10/26/2019.  She also started adjuvant radiation treatment  Interval history-patient does report some fatigue since her last chemotherapy.  She has noticed increasing ankle edema over the last 1 week.  She does admit to increased activity as well over the last 1 week.  Denies any pain in her leg  ECOG PS- 1 Pain scale- 0 Opioid associated constipation- no  Review of systems- Review of Systems  Constitutional: Positive for malaise/fatigue. Negative for chills, fever and weight loss.  HENT: Negative for congestion, ear discharge and nosebleeds.   Eyes: Negative for blurred vision.  Respiratory: Negative for cough, hemoptysis, sputum production, shortness of breath and wheezing.   Cardiovascular: Positive for leg swelling. Negative for chest pain, palpitations, orthopnea and claudication.  Gastrointestinal: Negative for abdominal pain, blood in stool, constipation, diarrhea, heartburn, melena, nausea and vomiting.  Genitourinary: Negative  for dysuria, flank pain, frequency, hematuria and urgency.    Musculoskeletal: Negative for back pain, joint pain and myalgias.  Skin: Negative for rash.  Neurological: Negative for dizziness, tingling, focal weakness, seizures, weakness and headaches.  Endo/Heme/Allergies: Does not bruise/bleed easily.  Psychiatric/Behavioral: Negative for depression and suicidal ideas. The patient does not have insomnia.        Allergies  Allergen Reactions  . Shellfish Allergy Rash  . Sulfa Antibiotics Rash     Past Medical History:  Diagnosis Date  . Complication of anesthesia   . Hyperlipidemia   . Hypertension   . Malignant neoplasm of upper-outer quadrant of left breast in female, estrogen receptor positive ([place]) 05/19/2019  . Personal history of chemotherapy   . PONV (postoperative nausea and vomiting)   . Vertigo    intermittent especially lying on left side     Past Surgical History:  Procedure Laterality Date  . ABDOMINAL HYSTERECTOMY  2004   complete  . BREAST BIOPSY Left 05/05/2019   [facility]- ribbon clip  . BREAST BIOPSY Left 05/16/2019   [facility]-DCIS  . BREAST BIOPSY Left 05/16/2019   [facility]- X clip  . BREAST BIOPSY WITH SENTINEL LYMPH NODE BIOPSY AND NEEDLE LOCALIZATION Left 06/05/2019   Procedure: BREAST BIOPSY WITH SENTINEL LYMPH NODE BIOPSY AND NEEDLE LOCALIZATION, LEFT, BRACKETING WIRE;  Surgeon: Robert Bellow, MD;  Location: [facility] ORS;  Service: General;  Laterality: Left;  . BREAST CYST ASPIRATION Bilateral age 23   neg  . CHOLECYSTECTOMY    . COLONOSCOPY WITH PROPOFOL N/A 08/02/2018   Procedure: COLONOSCOPY WITH PROPOFOL;  Surgeon: [name], MD;  [facility] ENDOSCOPY;  Service: Endoscopy;  Laterality: N/A;  . DILATION AND CURETTAGE OF UTERUS    . goiter surgery    . MASTECTOMY Left   . OOPHORECTOMY    . PORTACATH PLACEMENT Left 07/10/2019   Procedure: INSERTION PORT-A-CATH;  Surgeon: Jules Husbands, MD;  Location: [facility] ORS;  Service: General;  Laterality: Left;  . SIMPLE MASTECTOMY WITH AXILLARY SENTINEL NODE BIOPSY  Left 07/10/2019   Procedure: SIMPLE MASTECTOMY.; LEFT;  Surgeon: Jules Husbands, MD;  Location: [facility] ORS;  Service: General;  Laterality: Left;  . THYROIDECTOMY, PARTIAL      Social History   Socioeconomic History  . Marital status: Divorced    Spouse name: Not on file  . Number of children: Not on file  . Years of education: Not on file  . Highest education level: Not on file  Occupational History  . Not on file  Tobacco Use  . Smoking status: Never Smoker  . Smokeless tobacco: Never Used  Substance and Sexual Activity  . Alcohol use: No  . Drug use: No  . Sexual activity: Not Currently  Other Topics Concern  . Not on file  Social History Narrative  . Not on file   Social Determinants of Health   Financial Resource Strain:   . Difficulty of Paying Living Expenses: Not on file  Food Insecurity:   . Worried About Charity fundraiser in the Last Year: Not on file  . Ran Out of Food in the Last Year: Not on file  Transportation Needs:   . Lack of Transportation (Medical): Not on file  . Lack of Transportation (Non-Medical): Not on file  Physical Activity:   . Days of Exercise per Week: Not on file  . Minutes of Exercise per Session: Not on file  Stress:   . Feeling of Stress : Not on file  Social Connections:   . Frequency of Communication with Friends and Family: Not on file  . Frequency of Social Gatherings with Friends and Family: Not on file  . Attends Religious Services: Not on file  . Active Member of Clubs or Organizations: Not on file  . Attends Archivist Meetings: Not on file  . Marital Status: Not on file  Intimate Partner Violence:   . Fear of Current or Ex-Partner: Not on file  . Emotionally Abused: Not on file  . Physically Abused: Not on file  . Sexually Abused: Not on file    Family History  Problem Relation Age of Onset  . Breast cancer Cousin        mat cousin  . Hypertension Mother   . Heart attack Father   . Prostate cancer  Father   . Diabetes Father   . Colon polyps Father      Current Outpatient Medications:  .  acetaminophen (TYLENOL) 500 MG tablet, Take 500 mg by mouth every 6 (six) hours as needed (for pain/headaches.)., Disp: , Rfl:  .  aspirin EC 81 MG tablet, Take 81 mg by mouth daily., Disp: , Rfl:  .  atorvastatin (LIPITOR) 10 MG tablet, Take 10 mg by mouth daily. , Disp: , Rfl:  .  Calcium Carbonate-Vitamin D 600-400 MG-UNIT tablet, Take 1 tablet by mouth daily. , Disp: , Rfl:  .  lidocaine-prilocaine (EMLA) cream, Apply to affected area once, Disp: 30 g, Rfl: 3 .  lisinopril-hydrochlorothiazide (PRINZIDE,ZESTORETIC) 20-12.5 MG tablet, Take 1 tablet by mouth daily., Disp: , Rfl:  .  loratadine (CLARITIN) 10 MG tablet, Take 1 tablet by mouth. Take daily for 4 days after chemo, Disp: , Rfl:  .  Multiple Vitamin (MULTIVITAMIN WITH MINERALS) TABS tablet, Take 1 tablet by mouth daily after breakfast. Centrum Silver, Disp: , Rfl:  .  anastrozole (ARIMIDEX) 1 MG tablet, Take 1 tablet (1 mg total) by mouth daily., Disp: 30 tablet, Rfl: 2 .  ondansetron (ZOFRAN) 8 MG tablet, Take 1 tablet (8 mg total) by mouth 2 (two) times daily as needed for refractory nausea / vomiting. Start on day 3 after chemo. (Patient not taking: Reported on 11/20/2019), Disp: 30 tablet, Rfl: 1 .  prochlorperazine (COMPAZINE) 10 MG tablet, Take 1 tablet (10 mg total) by mouth every 6 (six) hours as needed (Nausea or vomiting). (Patient not taking: Reported on 11/13/2019), Disp: 30 tablet, Rfl: 1  Physical exam:  Vitals:   11/20/19 0953  BP: (!) 115/91  Pulse: 87  Temp: 98.4 F (36.9 C)  TempSrc: Tympanic  Weight: 205 lb (93 kg)  Height: 5' 1"  (1.549 m)   Physical Exam HENT:     Head: Normocephalic and atraumatic.  Eyes:     Pupils: Pupils are equal, round, and reactive to light.  Cardiovascular:     Rate and Rhythm: Normal rate and regular rhythm.     Heart sounds: Normal heart sounds.  Pulmonary:     Effort: Pulmonary  effort is normal.     Breath sounds: Normal breath sounds.  Abdominal:     General: Bowel sounds are normal.     Palpations: Abdomen is soft.  Musculoskeletal:     Cervical back: Normal range of motion.     Comments: Bilateral ankle edema left greater than right  Skin:    General: Skin is warm and dry.  Neurological:     Mental Status: She is alert and oriented to person, place, and time.  CMP Latest Ref Rng & Units 11/20/2019  Glucose 70 - 99 mg/dL 142(H)  BUN 8 - 23 mg/dL 12  Creatinine 0.44 - 1.00 mg/dL 0.68  Sodium 135 - 145 mmol/L 141  Potassium 3.5 - 5.1 mmol/L 3.6  Chloride 98 - 111 mmol/L 103  CO2 22 - 32 mmol/L 28  Calcium 8.9 - 10.3 mg/dL 9.5  Total Protein 6.5 - 8.1 g/dL 6.3(L)  Total Bilirubin 0.3 - 1.2 mg/dL 0.5  Alkaline Phos 38 - 126 U/L 59  AST 15 - 41 U/L 25  ALT 0 - 44 U/L 22   CBC Latest Ref Rng & Units 11/20/2019  WBC 4.0 - 10.5 K/uL 7.3  Hemoglobin 12.0 - 15.0 g/dL 9.2(L)  Hematocrit 36.0 - 46.0 % 28.4(L)  Platelets 150 - 400 K/uL 323    No images are attached to the encounter.  US Venous Img Lower Bilateral  Result Date: 11/20/2019 CLINICAL DATA:  68 year old female with a history of lower extremity edema and possible DVT EXAM: BILATERAL LOWER EXTREMITY VENOUS DOPPLER ULTRASOUND TECHNIQUE: Gray-scale sonography with graded compression, as well as color Doppler and duplex ultrasound were performed to evaluate the lower extremity deep venous systems from the level of the common femoral vein and including the common femoral, femoral, profunda femoral, popliteal and calf veins including the posterior tibial, peroneal and gastrocnemius veins when visible. The superficial great saphenous vein was also interrogated. Spectral Doppler was utilized to evaluate flow at rest and with distal augmentation maneuvers in the common femoral, femoral and popliteal veins. COMPARISON:  None. FINDINGS: RIGHT LOWER EXTREMITY Common Femoral Vein: No evidence of thrombus.  Normal compressibility, respiratory phasicity and response to augmentation. Saphenofemoral Junction: No evidence of thrombus. Normal compressibility and flow on color Doppler imaging. Profunda Femoral Vein: No evidence of thrombus. Normal compressibility and flow on color Doppler imaging. Femoral Vein: No evidence of thrombus. Normal compressibility, respiratory phasicity and response to augmentation. Popliteal Vein: No evidence of thrombus. Normal compressibility, respiratory phasicity and response to augmentation. Calf Veins: No evidence of thrombus. Normal compressibility and flow on color Doppler imaging. Superficial Great Saphenous Vein: No evidence of thrombus. Normal compressibility and flow on color Doppler imaging. Other Findings:  None. LEFT LOWER EXTREMITY Common Femoral Vein: No evidence of thrombus. Normal compressibility, respiratory phasicity and response to augmentation. Saphenofemoral Junction: No evidence of thrombus. Normal compressibility and flow on color Doppler imaging. Profunda Femoral Vein: No evidence of thrombus. Normal compressibility and flow on color Doppler imaging. Femoral Vein: No evidence of thrombus. Normal compressibility, respiratory phasicity and response to augmentation. Popliteal Vein: No evidence of thrombus. Normal compressibility, respiratory phasicity and response to augmentation. Calf Veins: No evidence of thrombus. Normal compressibility and flow on color Doppler imaging. Superficial Great Saphenous Vein: No evidence of thrombus. Normal compressibility and flow on color Doppler imaging. Other Findings:  None. IMPRESSION: Sonographic survey of the bilateral lower extremities negative for DVT Electronically Signed   By: Corrie Mckusick D.O.   On: 11/20/2019 13:43   DG Bone Density  Result Date: 11/09/2019 EXAM: DUAL X-RAY ABSORPTIOMETRY (DXA) FOR BONE MINERAL DENSITY IMPRESSION: Technologist: SCE Your patient Tanecia Mccay completed a BMD test on 11/09/2019 using the  [employer] (analysis version: 14.10) manufactured by [employer]. The following summarizes the results of our evaluation. PATIENT BIOGRAPHICAL: Name: Dell, Hurtubise Patient ID: [id number] Birth Date: September 12, 1951 Height: 61.0 in. Gender: Female Exam Date: 11/09/2019 Weight: 198.2 lbs. Indications: Advanced Age, Caucasian, Diabetic, Height Loss, History of Fracture (Adult), Hysterectomy,  Oophorectomy Bilateral, Osteoarthritis, Postmenopausal Fractures: left ankle Treatments: calcium w/ vit D, Multi-Vitamin ASSESSMENT: The BMD measured at Femur Neck Left is 1.030 g/cm2 with a T-score of -0.1. This patient is considered NORMAL according to [facility]) criteria. The scan quality is good. Site Region Measured Measured WHO Young Adult BMD Date       Age      Classification T-score AP Spine L1-L4 11/09/2019 70.5 Normal 1.0 1.318 g/cm2 AP Spine L1-L4 09/14/2007 58.4 Normal 0.2 1.216 g/cm2 DualFemur Neck Left 11/09/2019 70.5 Normal -0.1 1.030 g/cm2 DualFemur Neck Left 09/14/2007 58.4 Normal 0.2 1.064 g/cm2 DualFemur Total Mean 11/09/2019 70.5 Normal 0.8 1.107 g/cm2 DualFemur Total Mean 09/14/2007 58.4 Normal 0.7 1.102 g/cm2 World Health Organization [facility]) criteria for post-menopausal, Caucasian Women: Normal:       T-score at or above -1 SD Osteopenia:   T-score between -1 and -2.5 SD Osteoporosis: T-score at or below -2.5 SD RECOMMENDATIONS: 1. All patients should optimize calcium and vitamin D intake. 2. Consider FDA-approved medical therapies in postmenopausal women and men aged 32 years and older, based on the following: a. A hip or vertebral(clinical or morphometric) fracture b. T-score < -2.5 at the femoral neck or spine after appropriate evaluation to exclude secondary causes c. Low bone mass (T-score between -1.0 and -2.5 at the femoral neck or spine) and a 10-year probability of a hip fracture > 3% or a 10-year probability of a major osteoporosis-related fracture > 20% based on the  US-adapted WHO algorithm d. Clinician judgment and/or patient preferences may indicate treatment for people with 10-year fracture probabilities above or below these levels FOLLOW-UP: People with diagnosed cases of osteoporosis or at high risk for fracture should have regular bone mineral density tests. For patients eligible for Medicare, routine testing is allowed once every 2 years. The testing frequency can be increased to one year for patients who have rapidly progressing disease, those who are receiving or discontinuing medical therapy to restore bone mass, or have additional risk factors. I have reviewed this report, and agree with the above findings. Mark A. Thornton Papas, M.D. [facility] Radiology Electronically Signed   By: Lavonia Dana M.D.   On: 11/09/2019 11:01     Assessment and plan- Patient is a 68 y.o. female with invasive mammary carcinoma of the left breast pathological prognostic stage I [id number] ER PR positive HER-2/neu negative status post lumpectomy and sentinel lymph node biopsy.She is high risk mammprint score and s/p mastectomygiven concerns for residual disease.   She is s/p 4 cycles of adjuvant TC chemotherapy and currently undergoing adjuvant radiation treatment.  She is here for an acute visit for bilateral leg swelling  1.  Patient noted to have bilateral ankle edema left greater than right.  I did obtain bilateral leg Dopplers today which were negative for DVT.  Suspect she has been delayed fluid retention which could be possibly secondary to chemotherapy.  Her edema is mainly restricted to her ankles.  Recommend leg elevation as much as possible and if her edema persists at the end of 1 week I will consider giving her a short course of diuretics.  Patient verbalized understanding  2.  Patient will complete adjuvant radiation treatment sometime around the end of January.  I will go ahead and prescribe her Arimidex which she will take along with calcium and vitamin D given that she  had ER positive breast cancer.  She also had baseline bone density scan which was normal.Given that her tumor was ER PR  positive hormone therapy is indicated at this time.  I discussed the role for hormone therapy. Given that she is postmenopausal I would favor 5 years of adjuvant hormone therapy with aromatase inhibitor. I discussed the risks and benefits of Arimidex including all but not limited to fatigue, hypercholesterolemia, hot flashes, arthralgias and worsening bone health.  Patient will also need to be on calcium 1200 mg along with vitamin D 800 international units.  Written information about Arimidex given to the patient. I would like her to finish radiation therapy and start hormone therapy thereafter. Patient verbalized understanding and agrees to proceed.  Treatment is being given with a curative intent.  Ideally she would need hormone therapy for 10 years given that she had high risk breast cancer  3.  Patient would also be a candidate for adjuvant bisphosphonate therapy and she does have dental appointment scheduled in February.  Once we receive clearance from them I will plan to start her first dose of Zometa which she will get in March every 6 months up to 5 years  4.  Chemo-induced anemia: Continue to monitor and repeat CBC in March.  Dr. Dahlia Byes can DC her port at this time   Visit Diagnosis 1. Bilateral lower extremity edema   2. Goals of care, counseling/discussion   3. Malignant neoplasm of upper-outer quadrant of left breast in female, estrogen receptor positive ([place])   4. Anemia due to antineoplastic chemotherapy      Dr. Randa Evens, MD, MPH [facility] [phone] 11/21/2019 8:22 AM

## 2019-11-21 NOTE — Telephone Encounter (Signed)
Patient called asking if she is to start taking the increased dose of Calcium now or when she starts her AI after finishing radiation therapy. Please advise

## 2019-11-21 NOTE — Telephone Encounter (Signed)
After radiation

## 2019-11-21 NOTE — Telephone Encounter (Signed)
Patient informed to start after she completes radiation therapy.

## 2019-11-22 ENCOUNTER — Other Ambulatory Visit: Payer: Self-pay

## 2019-11-22 ENCOUNTER — Ambulatory Visit
Admission: RE | Admit: 2019-11-22 | Discharge: 2019-11-22 | Disposition: A | Discharge status: Home / Self Care | Payer: Medicare Other | Source: Ambulatory Visit | Attending: Radiation Oncology | Admitting: Radiation Oncology

## 2019-11-22 DIAGNOSIS — Z510 Encounter for antineoplastic radiation therapy: Secondary | ICD-10-CM | POA: Diagnosis not present

## 2019-11-23 ENCOUNTER — Ambulatory Visit
Admission: RE | Admit: 2019-11-23 | Discharge: 2019-11-23 | Disposition: A | Discharge status: Home / Self Care | Payer: Medicare Other | Source: Ambulatory Visit | Attending: Radiation Oncology | Admitting: Radiation Oncology

## 2019-11-23 ENCOUNTER — Other Ambulatory Visit: Payer: Self-pay

## 2019-11-23 DIAGNOSIS — Z510 Encounter for antineoplastic radiation therapy: Secondary | ICD-10-CM | POA: Diagnosis not present

## 2019-11-27 ENCOUNTER — Ambulatory Visit
Admission: RE | Admit: 2019-11-27 | Discharge: 2019-11-27 | Disposition: A | Discharge status: Home / Self Care | Payer: Medicare Other | Source: Ambulatory Visit | Attending: Radiation Oncology | Admitting: Radiation Oncology

## 2019-11-27 ENCOUNTER — Other Ambulatory Visit: Payer: Self-pay

## 2019-11-27 DIAGNOSIS — C50412 Malignant neoplasm of upper-outer quadrant of left female breast: Secondary | ICD-10-CM | POA: Diagnosis present

## 2019-11-27 DIAGNOSIS — Z510 Encounter for antineoplastic radiation therapy: Secondary | ICD-10-CM | POA: Insufficient documentation

## 2019-11-27 DIAGNOSIS — Z170 Estrogen receptor positive status [ER+]: Secondary | ICD-10-CM | POA: Insufficient documentation

## 2019-11-28 ENCOUNTER — Ambulatory Visit
Admission: RE | Admit: 2019-11-28 | Discharge: 2019-11-28 | Disposition: A | Discharge status: Home / Self Care | Payer: Medicare Other | Source: Ambulatory Visit | Attending: Radiation Oncology | Admitting: Radiation Oncology

## 2019-11-28 ENCOUNTER — Other Ambulatory Visit: Payer: Self-pay

## 2019-11-28 DIAGNOSIS — Z510 Encounter for antineoplastic radiation therapy: Secondary | ICD-10-CM | POA: Diagnosis not present

## 2019-11-29 ENCOUNTER — Other Ambulatory Visit: Payer: Self-pay

## 2019-11-29 ENCOUNTER — Inpatient Hospital Stay: Payer: Medicare Other | Attending: Oncology

## 2019-11-29 ENCOUNTER — Ambulatory Visit
Admission: RE | Admit: 2019-11-29 | Discharge: 2019-11-29 | Disposition: A | Discharge status: Home / Self Care | Payer: Medicare Other | Source: Ambulatory Visit | Attending: Radiation Oncology | Admitting: Radiation Oncology

## 2019-11-29 DIAGNOSIS — D6481 Anemia due to antineoplastic chemotherapy: Secondary | ICD-10-CM | POA: Insufficient documentation

## 2019-11-29 DIAGNOSIS — Z90710 Acquired absence of both cervix and uterus: Secondary | ICD-10-CM | POA: Diagnosis not present

## 2019-11-29 DIAGNOSIS — Z8042 Family history of malignant neoplasm of prostate: Secondary | ICD-10-CM | POA: Insufficient documentation

## 2019-11-29 DIAGNOSIS — C50412 Malignant neoplasm of upper-outer quadrant of left female breast: Secondary | ICD-10-CM | POA: Diagnosis present

## 2019-11-29 DIAGNOSIS — Z9079 Acquired absence of other genital organ(s): Secondary | ICD-10-CM | POA: Diagnosis not present

## 2019-11-29 DIAGNOSIS — Z79899 Other long term (current) drug therapy: Secondary | ICD-10-CM | POA: Diagnosis not present

## 2019-11-29 DIAGNOSIS — T451X5D Adverse effect of antineoplastic and immunosuppressive drugs, subsequent encounter: Secondary | ICD-10-CM | POA: Insufficient documentation

## 2019-11-29 DIAGNOSIS — Z803 Family history of malignant neoplasm of breast: Secondary | ICD-10-CM | POA: Insufficient documentation

## 2019-11-29 DIAGNOSIS — Z510 Encounter for antineoplastic radiation therapy: Secondary | ICD-10-CM | POA: Diagnosis not present

## 2019-11-29 DIAGNOSIS — Z90722 Acquired absence of ovaries, bilateral: Secondary | ICD-10-CM | POA: Diagnosis not present

## 2019-11-29 DIAGNOSIS — Z7982 Long term (current) use of aspirin: Secondary | ICD-10-CM | POA: Diagnosis not present

## 2019-11-29 DIAGNOSIS — Z170 Estrogen receptor positive status [ER+]: Secondary | ICD-10-CM | POA: Insufficient documentation

## 2019-11-29 DIAGNOSIS — Z79811 Long term (current) use of aromatase inhibitors: Secondary | ICD-10-CM | POA: Insufficient documentation

## 2019-11-29 LAB — CBC
HCT: 32 % — ABNORMAL LOW (ref 36.0–46.0)
Hemoglobin: 10.4 g/dL — ABNORMAL LOW (ref 12.0–15.0)
MCH: 29.7 pg (ref 26.0–34.0)
MCHC: 32.5 g/dL (ref 30.0–36.0)
MCV: 91.4 fL (ref 80.0–100.0)
Platelets: 324 10*3/uL (ref 150–400)
RBC: 3.5 MIL/uL — ABNORMAL LOW (ref 3.87–5.11)
RDW: 14.6 % (ref 11.5–15.5)
WBC: 7.9 10*3/uL (ref 4.0–10.5)
nRBC: 0 % (ref 0.0–0.2)

## 2019-11-30 ENCOUNTER — Ambulatory Visit
Admission: RE | Admit: 2019-11-30 | Discharge: 2019-11-30 | Disposition: A | Discharge status: Home / Self Care | Payer: Medicare Other | Source: Ambulatory Visit | Attending: Radiation Oncology | Admitting: Radiation Oncology

## 2019-11-30 ENCOUNTER — Other Ambulatory Visit: Payer: Self-pay

## 2019-11-30 DIAGNOSIS — Z510 Encounter for antineoplastic radiation therapy: Secondary | ICD-10-CM | POA: Diagnosis not present

## 2019-12-01 ENCOUNTER — Other Ambulatory Visit: Payer: Self-pay

## 2019-12-01 ENCOUNTER — Ambulatory Visit
Admission: RE | Admit: 2019-12-01 | Discharge: 2019-12-01 | Disposition: A | Discharge status: Home / Self Care | Payer: Medicare Other | Source: Ambulatory Visit | Attending: Radiation Oncology | Admitting: Radiation Oncology

## 2019-12-01 DIAGNOSIS — Z510 Encounter for antineoplastic radiation therapy: Secondary | ICD-10-CM | POA: Diagnosis not present

## 2019-12-04 ENCOUNTER — Other Ambulatory Visit: Payer: Self-pay

## 2019-12-04 ENCOUNTER — Ambulatory Visit
Admission: RE | Admit: 2019-12-04 | Discharge: 2019-12-04 | Disposition: A | Discharge status: Home / Self Care | Payer: Medicare Other | Source: Ambulatory Visit | Attending: Radiation Oncology | Admitting: Radiation Oncology

## 2019-12-04 DIAGNOSIS — Z510 Encounter for antineoplastic radiation therapy: Secondary | ICD-10-CM | POA: Diagnosis not present

## 2019-12-05 ENCOUNTER — Ambulatory Visit
Admission: RE | Admit: 2019-12-05 | Discharge: 2019-12-05 | Disposition: A | Discharge status: Home / Self Care | Payer: Medicare Other | Source: Ambulatory Visit | Attending: Radiation Oncology | Admitting: Radiation Oncology

## 2019-12-05 ENCOUNTER — Other Ambulatory Visit: Payer: Self-pay

## 2019-12-05 DIAGNOSIS — Z510 Encounter for antineoplastic radiation therapy: Secondary | ICD-10-CM | POA: Diagnosis not present

## 2019-12-06 ENCOUNTER — Ambulatory Visit
Admission: RE | Admit: 2019-12-06 | Discharge: 2019-12-06 | Disposition: A | Discharge status: Home / Self Care | Payer: Medicare Other | Source: Ambulatory Visit | Attending: Radiation Oncology | Admitting: Radiation Oncology

## 2019-12-06 ENCOUNTER — Other Ambulatory Visit: Payer: Self-pay

## 2019-12-06 DIAGNOSIS — Z510 Encounter for antineoplastic radiation therapy: Secondary | ICD-10-CM | POA: Diagnosis not present

## 2019-12-07 ENCOUNTER — Ambulatory Visit
Admission: RE | Admit: 2019-12-07 | Discharge: 2019-12-07 | Disposition: A | Discharge status: Home / Self Care | Payer: Medicare Other | Source: Ambulatory Visit | Attending: Radiation Oncology | Admitting: Radiation Oncology

## 2019-12-07 ENCOUNTER — Other Ambulatory Visit: Payer: Self-pay

## 2019-12-07 DIAGNOSIS — Z510 Encounter for antineoplastic radiation therapy: Secondary | ICD-10-CM | POA: Diagnosis not present

## 2019-12-08 ENCOUNTER — Ambulatory Visit
Admission: RE | Admit: 2019-12-08 | Discharge: 2019-12-08 | Disposition: A | Discharge status: Home / Self Care | Payer: Medicare Other | Source: Ambulatory Visit | Attending: Radiation Oncology | Admitting: Radiation Oncology

## 2019-12-08 ENCOUNTER — Other Ambulatory Visit: Payer: Self-pay

## 2019-12-08 DIAGNOSIS — Z510 Encounter for antineoplastic radiation therapy: Secondary | ICD-10-CM | POA: Diagnosis not present

## 2019-12-11 ENCOUNTER — Encounter: Payer: Self-pay | Admitting: Surgery

## 2019-12-11 ENCOUNTER — Ambulatory Visit
Admission: RE | Admit: 2019-12-11 | Discharge: 2019-12-11 | Disposition: A | Discharge status: Home / Self Care | Payer: Medicare Other | Source: Ambulatory Visit | Attending: Radiation Oncology | Admitting: Radiation Oncology

## 2019-12-11 ENCOUNTER — Ambulatory Visit (INDEPENDENT_AMBULATORY_CARE_PROVIDER_SITE_OTHER): Payer: Medicare Other | Admitting: Surgery

## 2019-12-11 ENCOUNTER — Other Ambulatory Visit: Payer: Self-pay

## 2019-12-11 VITALS — BP 155/86 | HR 90 | Temp 97.6°F | Resp 14 | Ht 61.0 in | Wt 205.0 lb

## 2019-12-11 DIAGNOSIS — C50412 Malignant neoplasm of upper-outer quadrant of left female breast: Secondary | ICD-10-CM

## 2019-12-11 DIAGNOSIS — Z170 Estrogen receptor positive status [ER+]: Secondary | ICD-10-CM

## 2019-12-11 DIAGNOSIS — Z510 Encounter for antineoplastic radiation therapy: Secondary | ICD-10-CM | POA: Diagnosis not present

## 2019-12-11 NOTE — Patient Instructions (Addendum)
Please call the office if you have questions or concerns.  You may shower 12/12/2019 but do not scrub over over th area.    Implanted Port Removal Implanted port removal is a procedure to remove the port and catheter (port-a-cath) that is implanted under your skin. The port is a small disc under your skin that can be punctured with a needle. It is connected to a vein in your chest or neck by a small flexible tube (catheter). The port-a-cath is used for treatment through an IV tube and for taking blood samples. Your health care provider will remove the port-a-cath if:  You no longer need it for treatment.  It is not working properly.  The area around it gets infected.  Tell a health care provider about:  Any allergies you have.  All medicines you are taking, including vitamins, herbs, eye drops, creams, and over-the-counter medicines.  Any problems you or family members have had with anesthetic medicines.  Any blood disorders you have.  Any surgeries you have had.  Any medical conditions you have.  Whether you are pregnant or may be pregnant. What are the risks? Generally, this is a safe procedure. However, problems may occur, including:  Infection.  Bleeding.  Allergic reactions to anesthetic medicines.  Damage to nerves or blood vessels.  What happens before the procedure?  You will have: ? A physical exam. ? Blood tests. ? Imaging tests, including a chest X-ray.  Follow instructions from your health care provider about eating or drinking restrictions.  Ask your health care provider about: ? Changing or stopping your regular medicines. This is especially important if you are taking diabetes medicines or blood thinners. ? Taking medicines such as aspirin and ibuprofen. These medicines can thin your blood. Do not take these medicines before your procedure if your surgeon instructs you not to.  Ask your health care provider how your surgical site will be marked or  identified.  You may be given antibiotic medicine to help prevent infection.  Plan to have someone take you home after the procedure.  If you will be going home right after the procedure, plan to have someone stay with you for 24 hours. What happens during the procedure?  To reduce your risk of infection: ? Your health care team will wash or sanitize their hands. ? Your skin will be washed with soap.  You may be given one or more of the following: ? A medicine to help you relax (sedative). ? A medicine to numb the area (local anesthetic).  A small cut (incision) will be made at the site of your port-a-cath.  The port-a-cath and the catheter that has been inside your vein will gently be removed.  The incision will be closed with stitches (sutures), adhesive strips, or skin glue.  A bandage (dressing) will be placed over the incision. The procedure may vary among health care providers and hospitals. What happens after the procedure?  Your blood pressure, heart rate, breathing rate, and blood oxygen level will be monitored often until the medicines you were given have worn off.  Do not drive for 24 hours if you received a sedative. This information is not intended to replace advice given to you by your health care provider. Make sure you discuss any questions you have with your health care provider. Document Released: 10/21/2015 Document Revised: 04/16/2016 Document Reviewed: 08/14/2015 Elsevier Interactive Patient Education  [employer].

## 2019-12-11 NOTE — Progress Notes (Signed)
  Dx: Breast CA  Procedure: Removal Port-A-Cath  Anesthesia: Lidocaine 1% with epinephrine 10 cc.  EBL: minimal  After informed consent was obtained the patient was prepped and draped in the usual sterile fashion lidocaine 1% with epi was infiltrated and incision was created with a 15 blade knife.  Hemostat was used to dissect through subcutaneous tissue and using Metzenbaum scissors we remove the 2 Prolene stitches.  A Valsalva maneuver was done and the port was removed.  Pressure was used to obtain hemostasis.  The wound was closed in a 2 layer fashion with 3-0 Vicryl for the dermis and 4-0 Monocryl for the skin. Dermabond was applied.

## 2019-12-12 ENCOUNTER — Other Ambulatory Visit: Payer: Self-pay

## 2019-12-12 ENCOUNTER — Ambulatory Visit
Admission: RE | Admit: 2019-12-12 | Discharge: 2019-12-12 | Disposition: A | Discharge status: Home / Self Care | Payer: Medicare Other | Source: Ambulatory Visit | Attending: Radiation Oncology | Admitting: Radiation Oncology

## 2019-12-12 DIAGNOSIS — Z510 Encounter for antineoplastic radiation therapy: Secondary | ICD-10-CM | POA: Diagnosis not present

## 2019-12-13 ENCOUNTER — Other Ambulatory Visit: Payer: Self-pay

## 2019-12-13 ENCOUNTER — Ambulatory Visit
Admission: RE | Admit: 2019-12-13 | Discharge: 2019-12-13 | Disposition: A | Discharge status: Home / Self Care | Payer: Medicare Other | Source: Ambulatory Visit | Attending: Radiation Oncology | Admitting: Radiation Oncology

## 2019-12-13 ENCOUNTER — Inpatient Hospital Stay: Payer: Medicare Other

## 2019-12-13 DIAGNOSIS — C50412 Malignant neoplasm of upper-outer quadrant of left female breast: Secondary | ICD-10-CM

## 2019-12-13 DIAGNOSIS — Z170 Estrogen receptor positive status [ER+]: Secondary | ICD-10-CM

## 2019-12-13 DIAGNOSIS — Z510 Encounter for antineoplastic radiation therapy: Secondary | ICD-10-CM | POA: Diagnosis not present

## 2019-12-13 LAB — CBC
HCT: 33 % — ABNORMAL LOW (ref 36.0–46.0)
Hemoglobin: 10.9 g/dL — ABNORMAL LOW (ref 12.0–15.0)
MCH: 28.7 pg (ref 26.0–34.0)
MCHC: 33 g/dL (ref 30.0–36.0)
MCV: 86.8 fL (ref 80.0–100.0)
Platelets: 292 10*3/uL (ref 150–400)
RBC: 3.8 MIL/uL — ABNORMAL LOW (ref 3.87–5.11)
RDW: 13.6 % (ref 11.5–15.5)
WBC: 7.8 10*3/uL (ref 4.0–10.5)
nRBC: 0 % (ref 0.0–0.2)

## 2019-12-14 ENCOUNTER — Other Ambulatory Visit: Payer: Self-pay

## 2019-12-14 ENCOUNTER — Ambulatory Visit
Admission: RE | Admit: 2019-12-14 | Discharge: 2019-12-14 | Disposition: A | Discharge status: Home / Self Care | Payer: Medicare Other | Source: Ambulatory Visit | Attending: Radiation Oncology | Admitting: Radiation Oncology

## 2019-12-14 DIAGNOSIS — Z510 Encounter for antineoplastic radiation therapy: Secondary | ICD-10-CM | POA: Diagnosis not present

## 2019-12-15 ENCOUNTER — Other Ambulatory Visit: Payer: Self-pay

## 2019-12-15 ENCOUNTER — Ambulatory Visit
Admission: RE | Admit: 2019-12-15 | Discharge: 2019-12-15 | Disposition: A | Discharge status: Home / Self Care | Payer: Medicare Other | Source: Ambulatory Visit | Attending: Radiation Oncology | Admitting: Radiation Oncology

## 2019-12-15 DIAGNOSIS — Z510 Encounter for antineoplastic radiation therapy: Secondary | ICD-10-CM | POA: Diagnosis not present

## 2019-12-18 ENCOUNTER — Other Ambulatory Visit: Payer: Self-pay

## 2019-12-18 ENCOUNTER — Ambulatory Visit
Admission: RE | Admit: 2019-12-18 | Discharge: 2019-12-18 | Disposition: A | Discharge status: Home / Self Care | Payer: Medicare Other | Source: Ambulatory Visit | Attending: Radiation Oncology | Admitting: Radiation Oncology

## 2019-12-18 ENCOUNTER — Telehealth: Payer: Self-pay | Admitting: *Deleted

## 2019-12-18 DIAGNOSIS — Z510 Encounter for antineoplastic radiation therapy: Secondary | ICD-10-CM | POA: Diagnosis not present

## 2019-12-18 NOTE — Telephone Encounter (Signed)
Patient called asking if Dr Janese Banks thinks she would be ok to get the COVID vaccine and to help her get an appointment through [facility] since the Health Department is not giving to anyone under 75. Please advise

## 2019-12-18 NOTE — Telephone Encounter (Signed)
We are not able to get anyone appointments unfortunately. She [place] have to [place] through the [place].

## 2019-12-18 NOTE — Telephone Encounter (Signed)
Patient informed of doctor response.

## 2019-12-19 ENCOUNTER — Other Ambulatory Visit: Payer: Self-pay

## 2019-12-19 ENCOUNTER — Ambulatory Visit: Payer: Medicare Other

## 2019-12-19 ENCOUNTER — Ambulatory Visit
Admission: RE | Admit: 2019-12-19 | Discharge: 2019-12-19 | Disposition: A | Discharge status: Home / Self Care | Payer: Medicare Other | Source: Ambulatory Visit | Attending: Radiation Oncology | Admitting: Radiation Oncology

## 2019-12-19 DIAGNOSIS — Z510 Encounter for antineoplastic radiation therapy: Secondary | ICD-10-CM | POA: Diagnosis not present

## 2019-12-20 ENCOUNTER — Other Ambulatory Visit: Payer: Self-pay

## 2019-12-20 ENCOUNTER — Ambulatory Visit
Admission: RE | Admit: 2019-12-20 | Discharge: 2019-12-20 | Disposition: A | Discharge status: Home / Self Care | Payer: Medicare Other | Source: Ambulatory Visit | Attending: Radiation Oncology | Admitting: Radiation Oncology

## 2019-12-20 DIAGNOSIS — Z510 Encounter for antineoplastic radiation therapy: Secondary | ICD-10-CM | POA: Diagnosis not present

## 2019-12-21 ENCOUNTER — Ambulatory Visit
Admission: RE | Admit: 2019-12-21 | Discharge: 2019-12-21 | Disposition: A | Discharge status: Home / Self Care | Payer: Medicare Other | Source: Ambulatory Visit | Attending: Radiation Oncology | Admitting: Radiation Oncology

## 2019-12-21 ENCOUNTER — Other Ambulatory Visit: Payer: Self-pay

## 2019-12-21 DIAGNOSIS — Z510 Encounter for antineoplastic radiation therapy: Secondary | ICD-10-CM | POA: Diagnosis not present

## 2019-12-22 ENCOUNTER — Other Ambulatory Visit: Payer: Self-pay

## 2019-12-22 ENCOUNTER — Ambulatory Visit
Admission: RE | Admit: 2019-12-22 | Discharge: 2019-12-22 | Disposition: A | Discharge status: Home / Self Care | Payer: Medicare Other | Source: Ambulatory Visit | Attending: Radiation Oncology | Admitting: Radiation Oncology

## 2019-12-22 DIAGNOSIS — Z510 Encounter for antineoplastic radiation therapy: Secondary | ICD-10-CM | POA: Diagnosis not present

## 2019-12-25 ENCOUNTER — Ambulatory Visit
Admission: RE | Admit: 2019-12-25 | Discharge: 2019-12-25 | Disposition: A | Discharge status: Home / Self Care | Payer: Medicare Other | Source: Ambulatory Visit | Attending: Radiation Oncology | Admitting: Radiation Oncology

## 2019-12-25 ENCOUNTER — Other Ambulatory Visit: Payer: Self-pay

## 2019-12-25 DIAGNOSIS — Z510 Encounter for antineoplastic radiation therapy: Secondary | ICD-10-CM | POA: Insufficient documentation

## 2019-12-25 DIAGNOSIS — Z170 Estrogen receptor positive status [ER+]: Secondary | ICD-10-CM | POA: Insufficient documentation

## 2019-12-25 DIAGNOSIS — C50412 Malignant neoplasm of upper-outer quadrant of left female breast: Secondary | ICD-10-CM | POA: Insufficient documentation

## 2019-12-26 ENCOUNTER — Ambulatory Visit
Admission: RE | Admit: 2019-12-26 | Discharge: 2019-12-26 | Disposition: A | Discharge status: Home / Self Care | Payer: Medicare Other | Source: Ambulatory Visit | Attending: Radiation Oncology | Admitting: Radiation Oncology

## 2019-12-26 ENCOUNTER — Other Ambulatory Visit: Payer: Self-pay

## 2019-12-26 DIAGNOSIS — Z510 Encounter for antineoplastic radiation therapy: Secondary | ICD-10-CM | POA: Diagnosis not present

## 2019-12-27 ENCOUNTER — Inpatient Hospital Stay: Payer: Medicare Other | Attending: Oncology

## 2019-12-27 ENCOUNTER — Other Ambulatory Visit: Payer: Self-pay

## 2019-12-27 ENCOUNTER — Ambulatory Visit
Admission: RE | Admit: 2019-12-27 | Discharge: 2019-12-27 | Disposition: A | Discharge status: Home / Self Care | Payer: Medicare Other | Source: Ambulatory Visit | Attending: Radiation Oncology | Admitting: Radiation Oncology

## 2019-12-27 DIAGNOSIS — Z170 Estrogen receptor positive status [ER+]: Secondary | ICD-10-CM | POA: Insufficient documentation

## 2019-12-27 DIAGNOSIS — Z510 Encounter for antineoplastic radiation therapy: Secondary | ICD-10-CM | POA: Diagnosis not present

## 2019-12-27 DIAGNOSIS — C50412 Malignant neoplasm of upper-outer quadrant of left female breast: Secondary | ICD-10-CM | POA: Diagnosis not present

## 2019-12-27 LAB — CBC
HCT: 34.3 % — ABNORMAL LOW (ref 36.0–46.0)
Hemoglobin: 11.4 g/dL — ABNORMAL LOW (ref 12.0–15.0)
MCH: 28.6 pg (ref 26.0–34.0)
MCHC: 33.2 g/dL (ref 30.0–36.0)
MCV: 86.2 fL (ref 80.0–100.0)
Platelets: 267 10*3/uL (ref 150–400)
RBC: 3.98 MIL/uL (ref 3.87–5.11)
RDW: 13.5 % (ref 11.5–15.5)
WBC: 5.6 10*3/uL (ref 4.0–10.5)
nRBC: 0 % (ref 0.0–0.2)

## 2019-12-28 ENCOUNTER — Other Ambulatory Visit: Payer: Self-pay

## 2019-12-28 ENCOUNTER — Ambulatory Visit
Admission: RE | Admit: 2019-12-28 | Discharge: 2019-12-28 | Disposition: A | Discharge status: Home / Self Care | Payer: Medicare Other | Source: Ambulatory Visit | Attending: Radiation Oncology | Admitting: Radiation Oncology

## 2019-12-28 ENCOUNTER — Ambulatory Visit: Payer: Medicare Other

## 2019-12-28 DIAGNOSIS — Z510 Encounter for antineoplastic radiation therapy: Secondary | ICD-10-CM | POA: Diagnosis not present

## 2019-12-29 ENCOUNTER — Ambulatory Visit: Payer: Medicare Other | Attending: Internal Medicine

## 2019-12-29 ENCOUNTER — Other Ambulatory Visit: Payer: Self-pay

## 2019-12-29 ENCOUNTER — Ambulatory Visit
Admission: RE | Admit: 2019-12-29 | Discharge: 2019-12-29 | Disposition: A | Discharge status: Home / Self Care | Payer: Medicare Other | Source: Ambulatory Visit | Attending: Radiation Oncology | Admitting: Radiation Oncology

## 2019-12-29 DIAGNOSIS — Z23 Encounter for immunization: Secondary | ICD-10-CM | POA: Insufficient documentation

## 2019-12-29 DIAGNOSIS — Z510 Encounter for antineoplastic radiation therapy: Secondary | ICD-10-CM | POA: Diagnosis not present

## 2019-12-29 NOTE — Progress Notes (Signed)
   Covid-19 Vaccination Clinic  Name:  ANASTASIJA ANFINSON    MRN: [id number] DOB: Mar 24, 1951  12/29/2019  Ms. Wrisley was observed post Covid-19 immunization for 15 minutes without incidence. She was provided with Vaccine Information Sheet and instruction to access the V-Safe system.   Ms. Defrancesco was instructed to call 911 with any severe reactions post vaccine: Marland Kitchen Difficulty breathing  . Swelling of your face and throat  . A fast heartbeat  . A bad rash all over your body  . Dizziness and weakness    Immunizations Administered    Name Date Dose VIS Date Route   Moderna COVID-19 Vaccine 12/29/2019  1:54 PM 0.5 mL 10/24/2019 Intramuscular   Manufacturer: Moderna   Lot: [id number]   [place]: [id number]

## 2020-01-01 ENCOUNTER — Other Ambulatory Visit: Payer: Self-pay

## 2020-01-01 ENCOUNTER — Ambulatory Visit
Admission: RE | Admit: 2020-01-01 | Discharge: 2020-01-01 | Disposition: A | Discharge status: Home / Self Care | Payer: Medicare Other | Source: Ambulatory Visit | Attending: Radiation Oncology | Admitting: Radiation Oncology

## 2020-01-01 DIAGNOSIS — Z510 Encounter for antineoplastic radiation therapy: Secondary | ICD-10-CM | POA: Diagnosis not present

## 2020-01-02 ENCOUNTER — Encounter: Payer: Self-pay | Admitting: Oncology

## 2020-01-02 ENCOUNTER — Other Ambulatory Visit: Payer: Self-pay

## 2020-01-02 ENCOUNTER — Ambulatory Visit
Admission: RE | Admit: 2020-01-02 | Discharge: 2020-01-02 | Disposition: A | Discharge status: Home / Self Care | Payer: Medicare Other | Source: Ambulatory Visit | Attending: Radiation Oncology | Admitting: Radiation Oncology

## 2020-01-02 DIAGNOSIS — Z510 Encounter for antineoplastic radiation therapy: Secondary | ICD-10-CM | POA: Diagnosis not present

## 2020-01-03 ENCOUNTER — Ambulatory Visit
Admission: RE | Admit: 2020-01-03 | Discharge: 2020-01-03 | Disposition: A | Discharge status: Home / Self Care | Payer: Medicare Other | Source: Ambulatory Visit | Attending: Radiation Oncology | Admitting: Radiation Oncology

## 2020-01-03 ENCOUNTER — Other Ambulatory Visit: Payer: Self-pay

## 2020-01-03 DIAGNOSIS — Z510 Encounter for antineoplastic radiation therapy: Secondary | ICD-10-CM | POA: Diagnosis not present

## 2020-01-04 ENCOUNTER — Ambulatory Visit
Admission: RE | Admit: 2020-01-04 | Discharge: 2020-01-04 | Disposition: A | Discharge status: Home / Self Care | Payer: Medicare Other | Source: Ambulatory Visit | Attending: Radiation Oncology | Admitting: Radiation Oncology

## 2020-01-04 ENCOUNTER — Ambulatory Visit: Payer: Medicare Other

## 2020-01-04 ENCOUNTER — Other Ambulatory Visit: Payer: Self-pay

## 2020-01-04 DIAGNOSIS — Z510 Encounter for antineoplastic radiation therapy: Secondary | ICD-10-CM | POA: Diagnosis not present

## 2020-01-05 ENCOUNTER — Ambulatory Visit
Admission: RE | Admit: 2020-01-05 | Discharge: 2020-01-05 | Disposition: A | Discharge status: Home / Self Care | Payer: Medicare Other | Source: Ambulatory Visit | Attending: Radiation Oncology | Admitting: Radiation Oncology

## 2020-01-05 ENCOUNTER — Other Ambulatory Visit: Payer: Self-pay

## 2020-01-05 DIAGNOSIS — Z510 Encounter for antineoplastic radiation therapy: Secondary | ICD-10-CM | POA: Diagnosis not present

## 2020-01-09 ENCOUNTER — Ambulatory Visit: Payer: Medicare Other

## 2020-01-10 ENCOUNTER — Other Ambulatory Visit: Payer: Self-pay | Admitting: Obstetrics & Gynecology

## 2020-01-10 DIAGNOSIS — Z1231 Encounter for screening mammogram for malignant neoplasm of breast: Secondary | ICD-10-CM

## 2020-01-17 ENCOUNTER — Encounter: Payer: Self-pay | Admitting: Oncology

## 2020-01-22 ENCOUNTER — Inpatient Hospital Stay: Payer: Medicare Other

## 2020-01-22 ENCOUNTER — Ambulatory Visit: Payer: Medicare Other | Admitting: Oncology

## 2020-01-22 ENCOUNTER — Other Ambulatory Visit: Payer: Self-pay

## 2020-01-22 ENCOUNTER — Inpatient Hospital Stay: Payer: Medicare Other | Attending: Oncology | Admitting: *Deleted

## 2020-01-22 ENCOUNTER — Encounter: Payer: Self-pay | Admitting: Oncology

## 2020-01-22 ENCOUNTER — Ambulatory Visit: Payer: Medicare Other

## 2020-01-22 ENCOUNTER — Inpatient Hospital Stay (HOSPITAL_BASED_OUTPATIENT_CLINIC_OR_DEPARTMENT_OTHER): Payer: Medicare Other | Admitting: Oncology

## 2020-01-22 ENCOUNTER — Other Ambulatory Visit: Payer: Medicare Other

## 2020-01-22 VITALS — BP 127/73 | HR 80 | Temp 98.7°F | Ht 61.0 in | Wt 207.0 lb

## 2020-01-22 DIAGNOSIS — Z5181 Encounter for therapeutic drug level monitoring: Secondary | ICD-10-CM

## 2020-01-22 DIAGNOSIS — Z170 Estrogen receptor positive status [ER+]: Secondary | ICD-10-CM | POA: Diagnosis not present

## 2020-01-22 DIAGNOSIS — Z90710 Acquired absence of both cervix and uterus: Secondary | ICD-10-CM | POA: Diagnosis not present

## 2020-01-22 DIAGNOSIS — C50412 Malignant neoplasm of upper-outer quadrant of left female breast: Secondary | ICD-10-CM | POA: Diagnosis present

## 2020-01-22 DIAGNOSIS — Z8249 Family history of ischemic heart disease and other diseases of the circulatory system: Secondary | ICD-10-CM | POA: Insufficient documentation

## 2020-01-22 DIAGNOSIS — Z803 Family history of malignant neoplasm of breast: Secondary | ICD-10-CM | POA: Insufficient documentation

## 2020-01-22 DIAGNOSIS — Z79811 Long term (current) use of aromatase inhibitors: Secondary | ICD-10-CM | POA: Insufficient documentation

## 2020-01-22 DIAGNOSIS — Z90722 Acquired absence of ovaries, bilateral: Secondary | ICD-10-CM | POA: Insufficient documentation

## 2020-01-22 DIAGNOSIS — Z8042 Family history of malignant neoplasm of prostate: Secondary | ICD-10-CM | POA: Diagnosis not present

## 2020-01-22 DIAGNOSIS — Z9012 Acquired absence of left breast and nipple: Secondary | ICD-10-CM | POA: Insufficient documentation

## 2020-01-22 DIAGNOSIS — I10 Essential (primary) hypertension: Secondary | ICD-10-CM | POA: Insufficient documentation

## 2020-01-22 DIAGNOSIS — Z95828 Presence of other vascular implants and grafts: Secondary | ICD-10-CM

## 2020-01-22 DIAGNOSIS — Z9221 Personal history of antineoplastic chemotherapy: Secondary | ICD-10-CM | POA: Insufficient documentation

## 2020-01-22 DIAGNOSIS — Z923 Personal history of irradiation: Secondary | ICD-10-CM | POA: Diagnosis not present

## 2020-01-22 DIAGNOSIS — Z9079 Acquired absence of other genital organ(s): Secondary | ICD-10-CM | POA: Insufficient documentation

## 2020-01-22 DIAGNOSIS — E785 Hyperlipidemia, unspecified: Secondary | ICD-10-CM | POA: Diagnosis not present

## 2020-01-22 DIAGNOSIS — Z7983 Long term (current) use of bisphosphonates: Secondary | ICD-10-CM | POA: Diagnosis not present

## 2020-01-22 DIAGNOSIS — Z833 Family history of diabetes mellitus: Secondary | ICD-10-CM | POA: Insufficient documentation

## 2020-01-22 DIAGNOSIS — Z7982 Long term (current) use of aspirin: Secondary | ICD-10-CM | POA: Insufficient documentation

## 2020-01-22 DIAGNOSIS — Z79899 Other long term (current) drug therapy: Secondary | ICD-10-CM | POA: Diagnosis not present

## 2020-01-22 LAB — CBC WITH DIFFERENTIAL/PLATELET
Abs Immature Granulocytes: 0.03 10*3/uL (ref 0.00–0.07)
Basophils Absolute: 0.1 10*3/uL (ref 0.0–0.1)
Basophils Relative: 1 %
Eosinophils Absolute: 0.2 10*3/uL (ref 0.0–0.5)
Eosinophils Relative: 3 %
HCT: 35.4 % — ABNORMAL LOW (ref 36.0–46.0)
Hemoglobin: 11.9 g/dL — ABNORMAL LOW (ref 12.0–15.0)
Immature Granulocytes: 0 %
Lymphocytes Relative: 16 %
Lymphs Abs: 1.2 10*3/uL (ref 0.7–4.0)
MCH: 28.4 pg (ref 26.0–34.0)
MCHC: 33.6 g/dL (ref 30.0–36.0)
MCV: 84.5 fL (ref 80.0–100.0)
Monocytes Absolute: 0.6 10*3/uL (ref 0.1–1.0)
Monocytes Relative: 8 %
Neutro Abs: 5.2 10*3/uL (ref 1.7–7.7)
Neutrophils Relative %: 72 %
Platelets: 271 10*3/uL (ref 150–400)
RBC: 4.19 MIL/uL (ref 3.87–5.11)
RDW: 13.4 % (ref 11.5–15.5)
WBC: 7.2 10*3/uL (ref 4.0–10.5)
nRBC: 0 % (ref 0.0–0.2)

## 2020-01-22 LAB — COMPREHENSIVE METABOLIC PANEL
ALT: 32 U/L (ref 0–44)
AST: 41 U/L (ref 15–41)
Albumin: 3.7 g/dL (ref 3.5–5.0)
Alkaline Phosphatase: 74 U/L (ref 38–126)
Anion gap: 11 (ref 5–15)
BUN: 15 mg/dL (ref 8–23)
CO2: 27 mmol/L (ref 22–32)
Calcium: 9.7 mg/dL (ref 8.9–10.3)
Chloride: 102 mmol/L (ref 98–111)
Creatinine, Ser: 1.06 mg/dL — ABNORMAL HIGH (ref 0.44–1.00)
GFR calc Af Amer: >60 mL/min (ref >60)
GFR calc non Af Amer: 53 mL/min — ABNORMAL LOW (ref >60)
Glucose, Bld: 144 mg/dL — ABNORMAL HIGH (ref 70–99)
Potassium: 4.2 mmol/L (ref 3.5–5.1)
Sodium: 140 mmol/L (ref 135–145)
Total Bilirubin: 0.5 mg/dL (ref 0.3–1.2)
Total Protein: 7.2 g/dL (ref 6.5–8.1)

## 2020-01-22 MED ORDER — SODIUM CHLORIDE 0.9 % IV SOLN
Freq: Once | INTRAVENOUS | Status: AC
  Filled 2020-01-22: qty 250

## 2020-01-22 MED ORDER — SODIUM CHLORIDE 0.9% FLUSH
10.0000 mL | Freq: Once | INTRAVENOUS | Status: DC
  Filled 2020-01-22: qty 10

## 2020-01-22 MED ORDER — ZOLEDRONIC ACID 4 MG/100ML IV SOLN
4.0000 mg | INTRAVENOUS | Status: DC
  Administered 2020-01-22: 4 mg via INTRAVENOUS
  Filled 2020-01-22: qty 100

## 2020-01-22 NOTE — Progress Notes (Signed)
Patient stated that she had been doing well with no complaints. Patient wanted to know if her nails were going to be "normal" again since right now they are brittle. Patient's last bone density was on 11/08/2020 and her mammogram is scheduled to be on 04/29/2020.

## 2020-01-23 ENCOUNTER — Encounter: Payer: Self-pay | Admitting: *Deleted

## 2020-01-23 ENCOUNTER — Other Ambulatory Visit: Payer: Self-pay

## 2020-01-23 ENCOUNTER — Encounter: Payer: Self-pay | Admitting: Oncology

## 2020-01-23 NOTE — Progress Notes (Signed)
Hematology/Oncology Consult note [facility]  [phone] [phone]  Patient Care Team: Ezequiel Kayser, MD as PCP - General (Internal Medicine)   Name of the patient: Amanda Wade  [id number]  Dec 05, 1950   Date of visit: 01/23/20  Diagnosis- pathological prognostic stage IA [id number] mammary carcinoma of the left breast status post lumpectomy and sentinel lymph node biopsy  Chief complaint/ Reason for visit-routine follow-up of breast cancer  Heme/Onc history: Patient is a 69 year old postmenopausal female with a past medical history significant for hypertension hyperlipidemia among other medical problems. She recently underwent a screening mammogram on 04/28/2019 which showed possible mass in the left breast. This was followed by diagnostic mammogram and ultrasound which showed bilobar hypo-Echoic mass at the 2:30 position of the left breast measuring 0.5 x 0.6 x 1.1 cm. Hypoechoic mass at the 2 o'clock position as well. And another bilobed irregular hypoechoic mass at the 3 o'clock position measuring 4 x 5 x 6 mm. Patient had biopsy of all these 3 masses. The 3 o'clock position breast mass came back as invasive mammary carcinoma 6 mm, grade 1 strongly ER PR positive greater than 90% and HER-2/neu negative. 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 230 breast mass also came back as intermediate grade DCIS. There was also another mass noted in the left breast which was biopsied and was 5 mm grade 1 ER PR positive and HER-2/neu negative.  Patient was taken for lumpectomy and sentinel lymph node biopsy by Dr. Bary Castilla on 06/05/2019.Final pathology showed invasive mammary carcinoma grade 1 ER PR positive and HER-2/neu negative at 4 different biopsy sites with extensive DCIS predominantly micropapillary type. One lymph node was also involved with macro metastatic carcinoma and was negative for extranodal  extension  Patient's case was discussed at tumor board and there was a concern that there may be potentially more areas of abnormality given extensive DCIS and invasive cancer seen in multiple foci in the lumpectomy specimen. Plan was to proceed with bilateral MRI.Breast MRI showed a large area of clumped discontinued non-mass enhancement in the left upper breast spanning an area of 9.8 cm in AP dimension suspicious for residual DCIS or invasive carcinoma.Patient therefore proceeded with r left mastectomy which showed small area of residual DCIS but no evidence of invasive malignancy.  Patient also had a MammaPrint testing done on her lumpectomy specimen which came back as high risk group. 29% chance of recurrence without chemotherapy at 5 years. Greater than 12% chemo benefit and estimated >93% survival at 5 years with chemotherapy and hormone therapy  Patient completed 4 cycles of adjuvant TC chemotherapy on 10/26/2019.  She completed adjuvant radiation treatment.  Patient started taking Arimidex in December 2020  Interval history-overall she is tolerating Arimidex along with calcium and vitamin D well.  Has mild ongoing fatigue but denies other complaints  ECOG PS- 1 Pain scale- 0   Review of systems- Review of Systems  Constitutional: Positive for malaise/fatigue. Negative for chills, fever and weight loss.  HENT: Negative for congestion, ear discharge and nosebleeds.   Eyes: Negative for blurred vision.  Respiratory: Negative for cough, hemoptysis, sputum production, shortness of breath and wheezing.   Cardiovascular: Negative for chest pain, palpitations, orthopnea and claudication.  Gastrointestinal: Negative for abdominal pain, blood in stool, constipation, diarrhea, heartburn, melena, nausea and vomiting.  Genitourinary: Negative for dysuria, flank pain, frequency, hematuria and urgency.  Musculoskeletal: Negative for back pain, joint pain and myalgias.  Skin: Negative for  rash.       Nail changes from chemotherapy  Neurological: Negative for dizziness, tingling, focal weakness, seizures, weakness and headaches.  Endo/Heme/Allergies: Does not bruise/bleed easily.  Psychiatric/Behavioral: Negative for depression and suicidal ideas. The patient does not have insomnia.        Allergies  Allergen Reactions  . Shellfish Allergy Rash  . Sulfa Antibiotics Rash     Past Medical History:  Diagnosis Date  . Complication of anesthesia   . Hyperlipidemia   . Hypertension   . Malignant neoplasm of upper-outer quadrant of left breast in female, estrogen receptor positive ([place]) 05/19/2019  . Personal history of chemotherapy   . PONV (postoperative nausea and vomiting)   . Vertigo    intermittent especially lying on left side     Past Surgical History:  Procedure Laterality Date  . ABDOMINAL HYSTERECTOMY  2004   complete  . BREAST BIOPSY Left 05/05/2019   [facility]- ribbon clip  . BREAST BIOPSY Left 05/16/2019   [facility]-DCIS  . BREAST BIOPSY Left 05/16/2019   [facility]- X clip  . BREAST BIOPSY WITH SENTINEL LYMPH NODE BIOPSY AND NEEDLE LOCALIZATION Left 06/05/2019   Procedure: BREAST BIOPSY WITH SENTINEL LYMPH NODE BIOPSY AND NEEDLE LOCALIZATION, LEFT, BRACKETING WIRE;  Surgeon: Robert Bellow, MD;  Location: [facility] ORS;  Service: General;  Laterality: Left;  . BREAST CYST ASPIRATION Bilateral age 71   neg  . CHOLECYSTECTOMY    . COLONOSCOPY WITH PROPOFOL N/A 08/02/2018   Procedure: COLONOSCOPY WITH PROPOFOL;  Surgeon: [name], MD;  Location: [facility] ENDOSCOPY;  Service: Endoscopy;  Laterality: N/A;  . DILATION AND CURETTAGE OF UTERUS    . goiter surgery    . MASTECTOMY Left   . OOPHORECTOMY    . PORTACATH PLACEMENT Left 07/10/2019   Procedure: INSERTION PORT-A-CATH;  Surgeon: Jules Husbands, MD;  Location: [facility] ORS;  Service: General;  Laterality: Left;  . SIMPLE MASTECTOMY WITH AXILLARY SENTINEL NODE BIOPSY Left 07/10/2019   Procedure: SIMPLE MASTECTOMY.;  LEFT;  Surgeon: Jules Husbands, MD;  Location: [facility] ORS;  Service: General;  Laterality: Left;  . THYROIDECTOMY, PARTIAL      Social History   Socioeconomic History  . Marital status: Divorced    Spouse name: Not on file  . Number of children: Not on file  . Years of education: Not on file  . Highest education level: Not on file  Occupational History  . Not on file  Tobacco Use  . Smoking status: Never Smoker  . Smokeless tobacco: Never Used  Substance and Sexual Activity  . Alcohol use: No  . Drug use: No  . Sexual activity: Not Currently  Other Topics Concern  . Not on file  Social History Narrative  . Not on file   Social Determinants of Health   Financial Resource Strain:   . Difficulty of Paying Living Expenses: Not on file  Food Insecurity:   . Worried About Charity fundraiser in the Last Year: Not on file  . Ran Out of Food in the Last Year: Not on file  Transportation Needs:   . Lack of Transportation (Medical): Not on file  . Lack of Transportation (Non-Medical): Not on file  Physical Activity:   . Days of Exercise per Week: Not on file  . Minutes of Exercise per Session: Not on file  Stress:   . Feeling of Stress : Not on file  Social Connections:   . Frequency of Communication with Friends and Family: Not  on file  . Frequency of Social Gatherings with Friends and Family: Not on file  . Attends Religious Services: Not on file  . Active Member of Clubs or Organizations: Not on file  . Attends Archivist Meetings: Not on file  . Marital Status: Not on file  Intimate Partner Violence:   . Fear of Current or Ex-Partner: Not on file  . Emotionally Abused: Not on file  . Physically Abused: Not on file  . Sexually Abused: Not on file    Family History  Problem Relation Age of Onset  . Breast cancer Cousin        mat cousin  . Hypertension Mother   . Heart attack Father   . Prostate cancer Father   . Diabetes Father   . Colon polyps Father       Current Outpatient Medications:  .  acetaminophen (TYLENOL) 500 MG tablet, Take 500 mg by mouth every 6 (six) hours as needed (for pain/headaches.)., Disp: , Rfl:  .  anastrozole (ARIMIDEX) 1 MG tablet, Take 1 tablet (1 mg total) by mouth daily., Disp: 30 tablet, Rfl: 2 .  aspirin EC 81 MG tablet, Take 81 mg by mouth daily., Disp: , Rfl:  .  atorvastatin (LIPITOR) 10 MG tablet, Take 10 mg by mouth daily. , Disp: , Rfl:  .  Calcium Carbonate-Vitamin D 600-400 MG-UNIT tablet, Take 2 tablets by mouth daily. , Disp: , Rfl:  .  lisinopril-hydrochlorothiazide (PRINZIDE,ZESTORETIC) 20-12.5 MG tablet, Take 1 tablet by mouth daily., Disp: , Rfl:  .  Multiple Vitamin (MULTIVITAMIN WITH MINERALS) TABS tablet, Take 1 tablet by mouth daily after breakfast. Centrum Silver, Disp: , Rfl:  No current facility-administered medications for this visit.  Facility-Administered Medications Ordered in Other Visits:  .  sodium chloride flush (NS) 0.9 % injection 10 mL, 10 mL, Intravenous, Once, Sindy Guadeloupe, MD  Physical exam:  Vitals:   01/22/20 1318  BP: 127/73  Pulse: 80  Temp: 98.7 F (37.1 C)  TempSrc: Tympanic  Weight: 207 lb (93.9 kg)  Height: 5' 1" (1.549 m)   Physical Exam HENT:     Head: Normocephalic and atraumatic.  Eyes:     Pupils: Pupils are equal, round, and reactive to light.  Cardiovascular:     Rate and Rhythm: Normal rate and regular rhythm.     Heart sounds: Normal heart sounds.  Pulmonary:     Effort: Pulmonary effort is normal.     Breath sounds: Normal breath sounds.  Abdominal:     General: Bowel sounds are normal.     Palpations: Abdomen is soft.  Musculoskeletal:     Cervical back: Normal range of motion.  Skin:    General: Skin is warm and dry.  Neurological:     Mental Status: She is alert and oriented to person, place, and time.   Patient is s/p left mastectomy without reconstruction.  No evidence of chest wall recurrence.  No palpable bilateral axillary  adenopathy.  No palpable masses in the right breast  CMP Latest Ref Rng & Units 01/22/2020  Glucose 70 - 99 mg/dL 144(H)  BUN 8 - 23 mg/dL 15  Creatinine 0.44 - 1.00 mg/dL 1.06(H)  Sodium 135 - 145 mmol/L 140  Potassium 3.5 - 5.1 mmol/L 4.2  Chloride 98 - 111 mmol/L 102  CO2 22 - 32 mmol/L 27  Calcium 8.9 - 10.3 mg/dL 9.7  Total Protein 6.5 - 8.1 g/dL 7.2  Total Bilirubin 0.3 - 1.2 mg/dL 0.5  Alkaline Phos 38 - 126 U/L 74  AST 15 - 41 U/L 41  ALT 0 - 44 U/L 32   CBC Latest Ref Rng & Units 01/22/2020  WBC 4.0 - 10.5 K/uL 7.2  Hemoglobin 12.0 - 15.0 g/dL 11.9(L)  Hematocrit 36.0 - 46.0 % 35.4(L)  Platelets 150 - 400 K/uL 271      Assessment and plan- Patient is a 69 y.o. female  with invasive mammary carcinoma of the left breast pathological prognostic stage I [id number] ER PR positive HER-2/neu negative status post lumpectomy and sentinel lymph node biopsy.She is high risk mammprint score and s/p mastectomygiven concerns for residual disease.  She is s/p 4 cycles of adjuvant TC chemotherapy and adjuvant radiation treatment.  She is currently on Arimidex and this is a routine follow-up visit  Patient is tolerating Arimidex along with calcium and vitamin D well and she will take it for 10 years.  Given the patient in high risk breast cancer to warrant adjuvant chemotherapy she will proceed with adjuvant bisphosphonate Zometa every 6 months for up to 5 years.  We have obtained dental clearance given the risk of osteonecrosis of the jaw and she will get her first dose today  I will see her back in 3 months with CBC with differential and CMP  . Visit Diagnosis 1. Malignant neoplasm of upper-outer quadrant of left breast in female, estrogen receptor positive ([place])   2. Long term (current) use of bisphosphonates   3. Visit for monitoring Arimidex therapy      Dr. Randa Evens, MD, MPH [facility] at [facility] [phone] 01/23/2020 11:07 AM

## 2020-01-24 ENCOUNTER — Encounter: Payer: Self-pay | Admitting: Oncology

## 2020-01-30 ENCOUNTER — Ambulatory Visit: Payer: Medicare Other | Attending: Internal Medicine

## 2020-01-30 ENCOUNTER — Telehealth: Payer: Self-pay

## 2020-01-30 DIAGNOSIS — Z23 Encounter for immunization: Secondary | ICD-10-CM | POA: Insufficient documentation

## 2020-01-30 DIAGNOSIS — Z170 Estrogen receptor positive status [ER+]: Secondary | ICD-10-CM

## 2020-01-30 DIAGNOSIS — C50412 Malignant neoplasm of upper-outer quadrant of left female breast: Secondary | ICD-10-CM

## 2020-01-30 NOTE — Telephone Encounter (Signed)
Survivorship Care Plan visit completed.  Treatment summary reviewed and mailed to patient also sent it via [place].  ASCO answers booklet reviewed and mailed to patient.  CARE program and Cancer Transitions discussed with patient along with other resources cancer center offers to patients and caregivers.  Patient verbalized understanding.  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  Pt in agreement for APP to introduce her to the Survivorship Clinic via [place] on March 23 at 10:00 am.   Packet mailed.

## 2020-01-30 NOTE — Progress Notes (Signed)
   Covid-19 Vaccination Clinic  Name:  Amanda Wade    MRN: [id number] DOB: May 09, 1951  01/30/2020  [name] was observed post Covid-19 immunization for 15 minutes without incident. She was provided with Vaccine Information Sheet and instruction to access the V-Safe system.   [name] was instructed to call 911 with any severe reactions post vaccine: Marland Kitchen Difficulty breathing  . Swelling of face and throat  . A fast heartbeat  . A bad rash all over body  . Dizziness and weakness   Immunizations Administered    Name Date Dose VIS Date Route   Moderna COVID-19 Vaccine 01/30/2020  1:24 PM 0.5 mL 10/24/2019 Intramuscular   Manufacturer: Moderna   Lot: [id number]   [place]: [id number]

## 2020-02-04 ENCOUNTER — Encounter: Payer: Self-pay | Admitting: Oncology

## 2020-02-08 ENCOUNTER — Other Ambulatory Visit: Payer: Self-pay

## 2020-02-08 ENCOUNTER — Encounter: Payer: Self-pay | Admitting: Radiation Oncology

## 2020-02-08 ENCOUNTER — Ambulatory Visit
Admission: RE | Admit: 2020-02-08 | Discharge: 2020-02-08 | Disposition: A | Discharge status: Home / Self Care | Payer: Medicare Other | Source: Ambulatory Visit | Attending: Radiation Oncology | Admitting: Radiation Oncology

## 2020-02-08 VITALS — BP 117/69 | HR 88 | Temp 98.0°F | Resp 16 | Wt 208.1 lb

## 2020-02-08 DIAGNOSIS — F17210 Nicotine dependence, cigarettes, uncomplicated: Secondary | ICD-10-CM | POA: Diagnosis not present

## 2020-02-08 DIAGNOSIS — Z79811 Long term (current) use of aromatase inhibitors: Secondary | ICD-10-CM | POA: Insufficient documentation

## 2020-02-08 DIAGNOSIS — I10 Essential (primary) hypertension: Secondary | ICD-10-CM | POA: Insufficient documentation

## 2020-02-08 DIAGNOSIS — Z7982 Long term (current) use of aspirin: Secondary | ICD-10-CM | POA: Diagnosis not present

## 2020-02-08 DIAGNOSIS — Z9221 Personal history of antineoplastic chemotherapy: Secondary | ICD-10-CM | POA: Insufficient documentation

## 2020-02-08 DIAGNOSIS — Z170 Estrogen receptor positive status [ER+]: Secondary | ICD-10-CM | POA: Insufficient documentation

## 2020-02-08 DIAGNOSIS — E785 Hyperlipidemia, unspecified: Secondary | ICD-10-CM | POA: Diagnosis not present

## 2020-02-08 DIAGNOSIS — C50412 Malignant neoplasm of upper-outer quadrant of left female breast: Secondary | ICD-10-CM | POA: Diagnosis present

## 2020-02-08 DIAGNOSIS — Z923 Personal history of irradiation: Secondary | ICD-10-CM | POA: Insufficient documentation

## 2020-02-08 NOTE — Progress Notes (Signed)
Radiation Oncology Follow up Note  Name: Amanda Wade   Date:   02/08/2020 MRN:  [id number] DOB: 1951/05/31    This 69 y.o. female presents to the clinic today for 1 month follow-up status post radiation therapy to her left chest wall and peripheral lymphatics status post left modified radical mastectomy for T1BN1 a M0 invasive mammary carcinoma ER/PR positive HER-2 negative.Marland Kitchen  REFERRING PROVIDER: Ezequiel Kayser, MD  HPI: Patient is a 69 year old female now at 1 month having completed left chest wall peripheral emphatic radiation for stage Ia invasive mammary carcinoma the left breast status post left modified radical mastectomy.  Seen today in routine follow-up she is doing well.  She specifically denies chest wall tenderness any new nodularity masses cough or bone pain..  She is currently on Arimidex tolerating it well.  She is also on Zometa every 6 months for up to 5 years.  COMPLICATIONS OF TREATMENT: none  FOLLOW UP COMPLIANCE: keeps appointments   PHYSICAL EXAM:  BP 117/69   Pulse 88   Temp 98 F (36.7 C) (Tympanic)   Resp 16   Wt 208 lb 1.6 oz (94.4 kg)   BMI 39.32 kg/m  Patient status post left modified radical mastectomy chest wall is clear no evidence of mass or nodularity is noted.  Right breast is free of dominant mass.  No axillary or supraclavicular adenopathy is noted no evidence of lymphedema of her left upper extremity is identified.  Well-developed well-nourished patient in NAD. HEENT reveals PERLA, EOMI, discs not visualized.  Oral cavity is clear. No oral mucosal lesions are identified. Neck is clear without evidence of cervical or supraclavicular adenopathy. Lungs are clear to A&P. Cardiac examination is essentially unremarkable with regular rate and rhythm without murmur rub or thrill. Abdomen is benign with no organomegaly or masses noted. Motor sensory and DTR levels are equal and symmetric in the upper and lower extremities. Cranial nerves II through XII are  grossly intact. Proprioception is intact. No peripheral adenopathy or edema is identified. No motor or sensory levels are noted. Crude visual fields are within normal range.  RADIOLOGY RESULTS: No current films for review  PLAN: Present time she is now 1 month out from left chest wall peripheral emphatic radiation doing well very low side effect profile.  I am pleased with her overall progress.  She continues on Arimidex without side effect.  She continues close follow-up care by medical oncology.  I have asked to see her back in 1 4 to 5 months for follow-up.  Patient knows to call with any concerns.  I would like to take this opportunity to thank you for allowing me to participate in the care of your patient.[name], MD

## 2020-02-13 ENCOUNTER — Encounter: Payer: Self-pay | Admitting: Nurse Practitioner

## 2020-02-13 ENCOUNTER — Inpatient Hospital Stay (HOSPITAL_BASED_OUTPATIENT_CLINIC_OR_DEPARTMENT_OTHER): Payer: Medicare Other | Admitting: Oncology

## 2020-02-13 ENCOUNTER — Encounter: Payer: Self-pay | Admitting: Oncology

## 2020-02-13 DIAGNOSIS — C50412 Malignant neoplasm of upper-outer quadrant of left female breast: Secondary | ICD-10-CM

## 2020-02-13 DIAGNOSIS — Z170 Estrogen receptor positive status [ER+]: Secondary | ICD-10-CM | POA: Diagnosis not present

## 2020-02-13 NOTE — Progress Notes (Signed)
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   BRIEF ONCOLOGIC HISTORY:  Oncology History  Malignant neoplasm of upper-outer quadrant of left breast in female, estrogen receptor positive ([place])  05/18/2019 Cancer Staging   Staging form: Breast, AJCC 8th Edition - Clinical stage from 05/18/2019: Stage IA (cT1c, cN0, cM0, G1, ER+, PR+, HER2-) - Signed by Sindy Guadeloupe, MD on 05/21/2019   05/19/2019 Initial Diagnosis   Malignant neoplasm of upper-outer quadrant of left breast in female, estrogen receptor positive ([place])   08/03/2019 -  Chemotherapy   The patient had palonosetron (ALOXI) injection 0.25 mg, 0.25 mg, Intravenous,  Once, 4 of 4 cycles Administration: 0.25 mg (08/03/2019), 0.25 mg (08/25/2019), 0.25 mg (09/15/2019), 0.25 mg (10/26/2019) pegfilgrastim (NEULASTA ONPRO KIT) injection 6 mg, 6 mg, Subcutaneous, Once, 4 of 4 cycles Administration: 6 mg (08/03/2019), 6 mg (08/25/2019), 6 mg (09/15/2019), 6 mg (10/26/2019) cyclophosphamide (CYTOXAN) 1,200 mg in sodium chloride 0.9 % 250 mL chemo infusion, 600 mg/m2 = 1,200 mg, Intravenous,  Once, 4 of 4 cycles Administration: 1,200 mg (08/03/2019), 1,200 mg (08/25/2019), 1,200 mg (09/15/2019), 1,200 mg (10/26/2019) DOCEtaxel (TAXOTERE) 150 mg in sodium chloride 0.9 % 250 mL chemo infusion, 75 mg/m2 = 150 mg, Intravenous,  Once, 4 of 4 cycles Dose modification: 60 mg/m2 (original dose 75 mg/m2, Cycle 2, Reason: Dose not tolerated) Administration: 150 mg (08/03/2019), 120 mg (08/25/2019), 120 mg (09/15/2019), 120 mg (10/26/2019)  for chemotherapy treatment.      INTERVAL HISTORY:  Patient presents to survivorship clinic with a past medical history significant for hypertension, hyperlipidemia and breast cancer.  She initially presented for a screening mammogram back in June  2020 which showed a possible mass in the left breast.  Diagnostic mammogram and ultrasound showed 3 hypoechoic mass in the left breast.  She had lumpectomy and sentinel lymph node biopsy by Dr. Tollie Pizza on 06/05/2019.  Final pathology revealed invasive mammary carcinoma ER/PR positive and HER-2/neu negative at 4 different biopsy sites with extensive DCIS predominantly micropapillary type.  There was 1 lymph node involved with macro metastatic carcinoma which was negative for extranodal extension.  Her case was discussed at tumor board and there was a concern for potentially more areas of abnormality given extensive DCIS and invasive cancer found in lumpectomy specimen so patient proceeded with left mastectomy.  Patient had MammaPrint testing which came back as a high risk group 29% chance of recurrence without chemotherapy in 5 years.  She completed 4 cycles of adjuvant TC chemotherapy on 10/26/2019.  She completed adjuvant radiation treatment and began Arimidex in December 2020.  In the interim, she has been doing well.  She feels that she has regained her strength.  She is walking daily and anticipates increasing the distance each week.  She is tolerating the Arimidex well without any side effect.  Patient will receive Zometa every 6 months for 5 years given her high risk breast cancer.  She denies any side effects from left breast mastectomy.  She was given physical therapy exercises to prevent lymphedema in left arm.  She does those every day.   ADDITIONAL REVIEW OF SYSTEMS:  Review of Systems  Constitutional: Negative.  Negative for chills, fever, malaise/fatigue and weight loss.  HENT: Negative for congestion, ear pain and tinnitus.   Eyes: Negative.  Negative for blurred vision and double vision.  Respiratory: Negative.  Negative  for cough, sputum production and shortness of breath.   Cardiovascular: Negative.  Negative for chest pain, palpitations and leg swelling.  Gastrointestinal: Negative.   Negative for abdominal pain, constipation, diarrhea, nausea and vomiting.  Genitourinary: Negative for dysuria, frequency and urgency.  Musculoskeletal: Negative for back pain and falls.  Skin: Negative.  Negative for rash.  Neurological: Negative.  Negative for weakness and headaches.  Endo/Heme/Allergies: Negative.  Does not bruise/bleed easily.  Psychiatric/Behavioral: Negative.  Negative for depression. The patient is not nervous/anxious and does not have insomnia.     PAST MEDICAL & SURGICAL HISTORY:  Past Medical History:  Diagnosis Date  . Complication of anesthesia   . Hyperlipidemia   . Hypertension   . Malignant neoplasm of upper-outer quadrant of left breast in female, estrogen receptor positive ([place]) 05/19/2019  . Personal history of chemotherapy   . PONV (postoperative nausea and vomiting)   . Vertigo    intermittent especially lying on left side   Past Surgical History:  Procedure Laterality Date  . ABDOMINAL HYSTERECTOMY  2004   complete  . BREAST BIOPSY Left 05/05/2019   [facility]- ribbon clip  . BREAST BIOPSY Left 05/16/2019   [facility]-DCIS  . BREAST BIOPSY Left 05/16/2019   [facility]- X clip  . BREAST BIOPSY WITH SENTINEL LYMPH NODE BIOPSY AND NEEDLE LOCALIZATION Left 06/05/2019   Procedure: BREAST BIOPSY WITH SENTINEL LYMPH NODE BIOPSY AND NEEDLE LOCALIZATION, LEFT, BRACKETING WIRE;  Surgeon: Robert Bellow, MD;  Location: [facility] ORS;  Service: General;  Laterality: Left;  . BREAST CYST ASPIRATION Bilateral age 48   neg  . CHOLECYSTECTOMY    . COLONOSCOPY WITH PROPOFOL N/A 08/02/2018   Procedure: COLONOSCOPY WITH PROPOFOL;  Surgeon: [name], MD;  Location: [facility];  Service: Endoscopy;  Laterality: N/A;  . DILATION AND CURETTAGE OF UTERUS    . goiter surgery    . MASTECTOMY Left   . OOPHORECTOMY    . PORTACATH PLACEMENT Left 07/10/2019   Procedure: INSERTION PORT-A-CATH;  Surgeon: Jules Husbands, MD;  Location: [facility] ORS;  Service: General;  Laterality:  Left;  . SIMPLE MASTECTOMY WITH AXILLARY SENTINEL NODE BIOPSY Left 07/10/2019   Procedure: SIMPLE MASTECTOMY.; LEFT;  Surgeon: Jules Husbands, MD;  Location: [facility] ORS;  Service: General;  Laterality: Left;  . THYROIDECTOMY, PARTIAL      SOCIAL HISTORY:  None   CURRENT MEDICATIONS:  Current Outpatient Medications on File Prior to Visit  Medication Sig Dispense Refill  . acetaminophen (TYLENOL) 500 MG tablet Take 500 mg by mouth every 6 (six) hours as needed (for pain/headaches.).    Marland Kitchen anastrozole (ARIMIDEX) 1 MG tablet Take 1 tablet (1 mg total) by mouth daily. 30 tablet 2  . aspirin EC 81 MG tablet Take 81 mg by mouth daily.    Marland Kitchen atorvastatin (LIPITOR) 10 MG tablet Take 10 mg by mouth daily.     . Calcium Carbonate-Vitamin D 600-400 MG-UNIT tablet Take 2 tablets by mouth daily.     Marland Kitchen lisinopril-hydrochlorothiazide (PRINZIDE,ZESTORETIC) 20-12.5 MG tablet Take 1 tablet by mouth daily.    . Multiple Vitamin (MULTIVITAMIN WITH MINERALS) TABS tablet Take 1 tablet by mouth daily after breakfast. Centrum Silver     No current facility-administered medications on file prior to visit.    ALLERGIES:  Allergies  Allergen Reactions  . Shellfish Allergy Rash  . Sulfa Antibiotics Rash    PHYSICAL EXAM:  Limited due to virtual platform  LABORATORY DATA:  Lab Results  Component Value Date  WBC 7.2 01/22/2020   HGB 11.9 (L) 01/22/2020   HCT 35.4 (L) 01/22/2020   MCV 84.5 01/22/2020   PLT 271 01/22/2020      Chemistry      Component Value Date/Time   NA 140 01/22/2020 1244   K 4.2 01/22/2020 1244   CL 102 01/22/2020 1244   CO2 27 01/22/2020 1244   BUN 15 01/22/2020 1244   CREATININE 1.06 (H) 01/22/2020 1244      Component Value Date/Time   CALCIUM 9.7 01/22/2020 1244   ALKPHOS 74 01/22/2020 1244   AST 41 01/22/2020 1244   ALT 32 01/22/2020 1244   BILITOT 0.5 01/22/2020 1244      DIAGNOSTIC IMAGING:   Bone Density   T-score -0.1- Normal   RECOMMENDATIONS: 1. All  patients should optimize calcium and vitamin D intake. 2. Consider FDA-approved medical therapies in postmenopausal women and men aged 61 years and older, based on the following: a. A hip or vertebral(clinical or morphometric) fracture b. T-score < -2.5 at the femoral neck or spine after appropriate evaluation to exclude secondary causes c. Low bone mass (T-score between -1.0 and -2.5 at the femoral neck or spine) and a 10-year probability of a hip fracture > 3% or a 10-year probability of a major osteoporosis-related fracture > 20% based on the [place]-adapted WHO algorithm d. Clinician judgment and/or patient preferences may indicate treatment for people with 10-year fracture probabilities above or below these levels   ASSESSMENT & PLAN:  Ms. Fanguy is a pleasant 69 y.o. female with history of ER/PR positive HER-2/neu negative extensive DCIS status post mastectomy by Dr. Bary Castilla and treated with 4 cycles of Taxotere and Cytoxan with concurrent radiation in December 2020.  She was started on Arimidex in December and is tolerating well.  Patient presents to survivorship clinic today for survivorship care plan visit and to address any acute survivorship concerns since completing treatment.    1. History of 1 and a invasive mammary carcinoma of the left breast: Clinically, she is without evidence of disease recurrence based on physical exam/diagnostic imaging.  Today, she received a copy of her survivorship care plan (SCP) document, which was reviewed with her in detail.  The SCP details her cancer treatment history and potential late/long-term side effects of those treatments.  We discussed the follow-up schedule she can anticipate with interval imaging for surveillance of her cancer.  I have also shared a copy of her treatment summary/SCP with her PCP.  Ms. Monica will return to the survivorship clinic as needed; she will return to [place] [facility] for surveillance visit with Dr. Janese Banks  in June 2021.  2. Problem at visit: Mrs. Vlcek is doing well since completing treatment and beginning her aromatase inhibitor.  Patient does have questions regarding her plan of care moving forward.  She is wondering why she will not get routine imaging such as a PET scan.  We reviewed NCCN guidelines and I assured her that Dr. Janese Banks has scheduled her follow-up appropriately.  She will have a mammogram yearly of her right breast and will be seen by Dr. Janese Banks approximately every 3-6 months with lab work and assessment.   3. Smoking cessation: I commended Ms. Kunka's continued efforts to remain tobacco-free.  We discussed that one of the most important risk reduction strategies in preventing cancer recurrence in lung cancer patients is smoking cessation.  He is committed to abstaining from tobacco.  4. Physical activity/Healthy eating: Getting adequate physical activity and maintaining  a healthy diet as a cancer survivor is important for overall wellness and reduces the risk of cancer recurrence. We discussed the CARE program which is a fitness program that is offered to cancer survivors free of charge.  We also reviewed the American Cancer Society's booklet with recommendations for nutrition and physical activity.    4. Health promotion/Cancer screening:  Ms. Umeda is reportedly up-to-date on her colonoscopy, pap smear, skin screenings, and vaccinations.  I encouraged her to talk with his PCP about arranging appropriate cancer screening tests, as appropriate.   5. Support services/Counseling: Ms. Manzer was seen today in in effort to address both the physical and social concerns of our cancer survivors at [facility]. It is not uncommon for this period of the patient's cancer care trajectory to be one of many emotions and stressors.  I provided support today through active listening, validation of concerns, and expressive supportive counseling.  Ms. Friedhoff was encouraged to take  advantage of our support services programs and support groups to better cope in her new life as a cancer survivor after completing anti-cancer treatment.   NCCN Guidelines and Recommendations:  NCCN guideline recommendations for invasive breast cancer ER/PR positive HER-2/neu negative disease  1.  Interval history and physical exam 1-4 times per year as clinically appropriate for 5 years, then annually.  2.  Mammogram every 12 months.  3, endocrine therapy: Assess and encourage adherence; patients taking tamoxifen should have annual gynecological  assessment.   4.  Patient is on an AI or those who experienced ovarian failure secondary to treatment should have monitoring of bone health with a bone mineral density at baseline and periodically thereafter.  Dispo:  -RTC for follow-up with  Dr. Janese Banks on 04/23/2020. -RTC for follow-up with radiation oncology as scheduled. -Mammogram due in July 2021- Dr. Janese Banks will schedule at next visit.   A total of 30 minutes was spent in face-to-face care of this patient, with greater than 50% of that time spent in counseling and care coordination.    Rulon Abide, AGNP-C [place] [facility] [place] (office) 02/13/20 3:13 PM

## 2020-03-08 ENCOUNTER — Other Ambulatory Visit: Payer: Self-pay | Admitting: Oncology

## 2020-04-23 ENCOUNTER — Inpatient Hospital Stay: Payer: Medicare Other | Attending: Oncology

## 2020-04-23 ENCOUNTER — Other Ambulatory Visit: Payer: Self-pay

## 2020-04-23 ENCOUNTER — Encounter: Payer: Self-pay | Admitting: Oncology

## 2020-04-23 ENCOUNTER — Inpatient Hospital Stay (HOSPITAL_BASED_OUTPATIENT_CLINIC_OR_DEPARTMENT_OTHER): Payer: Medicare Other | Admitting: Oncology

## 2020-04-23 VITALS — BP 123/68 | HR 77 | Temp 98.3°F | Resp 18 | Wt 207.1 lb

## 2020-04-23 DIAGNOSIS — Z8249 Family history of ischemic heart disease and other diseases of the circulatory system: Secondary | ICD-10-CM | POA: Insufficient documentation

## 2020-04-23 DIAGNOSIS — Z853 Personal history of malignant neoplasm of breast: Secondary | ICD-10-CM | POA: Diagnosis not present

## 2020-04-23 DIAGNOSIS — E785 Hyperlipidemia, unspecified: Secondary | ICD-10-CM | POA: Diagnosis not present

## 2020-04-23 DIAGNOSIS — I10 Essential (primary) hypertension: Secondary | ICD-10-CM | POA: Diagnosis not present

## 2020-04-23 DIAGNOSIS — Z170 Estrogen receptor positive status [ER+]: Secondary | ICD-10-CM | POA: Diagnosis not present

## 2020-04-23 DIAGNOSIS — Z08 Encounter for follow-up examination after completed treatment for malignant neoplasm: Secondary | ICD-10-CM | POA: Diagnosis not present

## 2020-04-23 DIAGNOSIS — Z79899 Other long term (current) drug therapy: Secondary | ICD-10-CM | POA: Insufficient documentation

## 2020-04-23 DIAGNOSIS — Z7982 Long term (current) use of aspirin: Secondary | ICD-10-CM | POA: Insufficient documentation

## 2020-04-23 DIAGNOSIS — Z79811 Long term (current) use of aromatase inhibitors: Secondary | ICD-10-CM | POA: Diagnosis not present

## 2020-04-23 DIAGNOSIS — C50412 Malignant neoplasm of upper-outer quadrant of left female breast: Secondary | ICD-10-CM | POA: Insufficient documentation

## 2020-04-23 DIAGNOSIS — Z5181 Encounter for therapeutic drug level monitoring: Secondary | ICD-10-CM

## 2020-04-23 DIAGNOSIS — Z8042 Family history of malignant neoplasm of prostate: Secondary | ICD-10-CM | POA: Diagnosis not present

## 2020-04-23 DIAGNOSIS — Z90721 Acquired absence of ovaries, unilateral: Secondary | ICD-10-CM | POA: Insufficient documentation

## 2020-04-23 DIAGNOSIS — Z90710 Acquired absence of both cervix and uterus: Secondary | ICD-10-CM | POA: Diagnosis not present

## 2020-04-23 DIAGNOSIS — Z803 Family history of malignant neoplasm of breast: Secondary | ICD-10-CM | POA: Insufficient documentation

## 2020-04-23 DIAGNOSIS — Z833 Family history of diabetes mellitus: Secondary | ICD-10-CM | POA: Diagnosis not present

## 2020-04-23 DIAGNOSIS — Z9012 Acquired absence of left breast and nipple: Secondary | ICD-10-CM | POA: Diagnosis not present

## 2020-04-23 LAB — COMPREHENSIVE METABOLIC PANEL
ALT: 41 U/L (ref 0–44)
AST: 49 U/L — ABNORMAL HIGH (ref 15–41)
Albumin: 3.9 g/dL (ref 3.5–5.0)
Alkaline Phosphatase: 72 U/L (ref 38–126)
Anion gap: 10 (ref 5–15)
BUN: 15 mg/dL (ref 8–23)
CO2: 27 mmol/L (ref 22–32)
Calcium: 9.2 mg/dL (ref 8.9–10.3)
Chloride: 100 mmol/L (ref 98–111)
Creatinine, Ser: 0.77 mg/dL (ref 0.44–1.00)
GFR calc Af Amer: >60 mL/min (ref >60)
GFR calc non Af Amer: >60 mL/min (ref >60)
Glucose, Bld: 160 mg/dL — ABNORMAL HIGH (ref 70–99)
Potassium: 3.8 mmol/L (ref 3.5–5.1)
Sodium: 137 mmol/L (ref 135–145)
Total Bilirubin: 0.6 mg/dL (ref 0.3–1.2)
Total Protein: 7.2 g/dL (ref 6.5–8.1)

## 2020-04-23 LAB — CBC WITH DIFFERENTIAL/PLATELET
Abs Immature Granulocytes: 0.03 10*3/uL (ref 0.00–0.07)
Basophils Absolute: 0 10*3/uL (ref 0.0–0.1)
Basophils Relative: 1 %
Eosinophils Absolute: 0.2 10*3/uL (ref 0.0–0.5)
Eosinophils Relative: 3 %
HCT: 34.2 % — ABNORMAL LOW (ref 36.0–46.0)
Hemoglobin: 12.2 g/dL (ref 12.0–15.0)
Immature Granulocytes: 1 %
Lymphocytes Relative: 18 %
Lymphs Abs: 1.1 10*3/uL (ref 0.7–4.0)
MCH: 30.3 pg (ref 26.0–34.0)
MCHC: 35.7 g/dL (ref 30.0–36.0)
MCV: 84.9 fL (ref 80.0–100.0)
Monocytes Absolute: 0.4 10*3/uL (ref 0.1–1.0)
Monocytes Relative: 7 %
Neutro Abs: 4.3 10*3/uL (ref 1.7–7.7)
Neutrophils Relative %: 70 %
Platelets: 279 10*3/uL (ref 150–400)
RBC: 4.03 MIL/uL (ref 3.87–5.11)
RDW: 13.1 % (ref 11.5–15.5)
WBC: 6 10*3/uL (ref 4.0–10.5)
nRBC: 0 % (ref 0.0–0.2)

## 2020-04-23 NOTE — Progress Notes (Signed)
Patient states she feels more stiffness in her body,has dry mouth, has trigger finger has plans to go to PCP and get an referral for treatment, has question if that will affect her current infusion treatment.

## 2020-04-25 NOTE — Progress Notes (Signed)
Hematology/Oncology Consult note [facility]  Telephone:([phone] [phone]  Patient Care Team: Ezequiel Kayser, MD as PCP - General (Internal Medicine) Sindy Guadeloupe, MD as Consulting Physician (Oncology) Bary Castilla, Forest Gleason, MD as Consulting Physician (General Surgery) Rico Junker, RN as Oncology Nurse Navigator [name], MD as Referring Physician (Radiation Oncology)   Name of the patient: Amanda Wade  [id number]  04/20/1951   Date of visit: 04/25/20  Diagnosis- pathological prognostic stage IA [id number] mammary carcinoma of the left breast status post lumpectomy and sentinel lymph node biopsy  Chief complaint/ Reason for visit-routine follow-up of breast cancer  Heme/Onc history: Patient is a 69 year old postmenopausal female with a past medical history significant for hypertension hyperlipidemia among other medical problems. She recently underwent a screening mammogram on 04/28/2019 which showed possible mass in the left breast. This was followed by diagnostic mammogram and ultrasound which showed bilobar hypo-Echoic mass at the 2:30 position of the left breast measuring 0.5 x 0.6 x 1.1 cm. Hypoechoic mass at the 2 o'clock position as well. And another bilobed irregular hypoechoic mass at the 3 o'clock position measuring 4 x 5 x 6 mm. Patient had biopsy of all these 3 masses. The 3 o'clock position breast mass came back as invasive mammary carcinoma 6 mm, grade 1 strongly ER PR positive greater than 90% and HER-2/neu negative. 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 230 breast mass also came back as intermediate grade DCIS. There was also another mass noted in the left breast which was biopsied and was 5 mm grade 1 ER PR positive and HER-2/neu negative.  Patient was taken for lumpectomy and sentinel lymph node biopsy by Dr. Bary Castilla on 06/05/2019.Final pathology showed invasive mammary carcinoma grade 1 ER  PR positive and HER-2/neu negative at 4 different biopsy sites with extensive DCIS predominantly micropapillary type. One lymph node was also involved with macro metastatic carcinoma and was negative for extranodal extension  Patient's case was discussed at tumor board and there was a concern that there may be potentially more areas of abnormality given extensive DCIS and invasive cancer seen in multiple foci in the lumpectomy specimen. Plan was to proceed with bilateral MRI.Breast MRI showed a large area of clumped discontinued non-mass enhancement in the left upper breast spanning an area of 9.8 cm in AP dimension suspicious for residual DCIS or invasive carcinoma.Patient therefore proceeded with r left mastectomy which showed small area of residual DCIS but no evidence of invasive malignancy.  Patient also had a MammaPrint testing done on her lumpectomy specimen which came back as high risk group. 29% chance of recurrence without chemotherapy at 5 years. Greater than 12% chemo benefit and estimated >93% survival at 5 years with chemotherapy and hormone therapy  Patient completed 4 cycles of adjuvant TC chemotherapy on 10/26/2019. She completed adjuvant radiation treatment.  Patient started taking Arimidex in December 2020  Interval history- She is doing well and tolerating Arimidex along with calcium and vitamin D without significant side effects.  She has minor aches and pains in her joints which have persisted even prior to Arimidex and are not particularly worse.  Appetite and weight are stable.  Denies any new aches or pains anywhere  ECOG PS- 1 Pain scale- 0   Review of systems- Review of Systems  Constitutional: Negative for chills, fever, malaise/fatigue and weight loss.  HENT: Negative for congestion, ear discharge and nosebleeds.   Eyes: Negative for blurred vision.  Respiratory:  Negative for cough, hemoptysis, sputum production, shortness of breath and wheezing.     Cardiovascular: Negative for chest pain, palpitations, orthopnea and claudication.  Gastrointestinal: Negative for abdominal pain, blood in stool, constipation, diarrhea, heartburn, melena, nausea and vomiting.  Genitourinary: Negative for dysuria, flank pain, frequency, hematuria and urgency.  Musculoskeletal: Negative for back pain, joint pain and myalgias.  Skin: Negative for rash.  Neurological: Negative for dizziness, tingling, focal weakness, seizures, weakness and headaches.  Endo/Heme/Allergies: Does not bruise/bleed easily.  Psychiatric/Behavioral: Negative for depression and suicidal ideas. The patient does not have insomnia.       Allergies  Allergen Reactions  . Shellfish Allergy Rash  . Sulfa Antibiotics Rash     Past Medical History:  Diagnosis Date  . Complication of anesthesia   . Hyperlipidemia   . Hypertension   . Malignant neoplasm of upper-outer quadrant of left breast in female, estrogen receptor positive ([place]) 05/19/2019  . Personal history of chemotherapy   . PONV (postoperative nausea and vomiting)   . Vertigo    intermittent especially lying on left side     Past Surgical History:  Procedure Laterality Date  . ABDOMINAL HYSTERECTOMY  2004   complete  . BREAST BIOPSY Left 05/05/2019   [facility]- ribbon clip  . BREAST BIOPSY Left 05/16/2019   us-DCIS  . BREAST BIOPSY Left 05/16/2019   [facility]- X clip  . BREAST BIOPSY WITH SENTINEL LYMPH NODE BIOPSY AND NEEDLE LOCALIZATION Left 06/05/2019   Procedure: BREAST BIOPSY WITH SENTINEL LYMPH NODE BIOPSY AND NEEDLE LOCALIZATION, LEFT, BRACKETING WIRE;  Surgeon: Robert Bellow, MD;  Location: [facility] ORS;  Service: General;  Laterality: Left;  . BREAST CYST ASPIRATION Bilateral age 23   neg  . CHOLECYSTECTOMY    . COLONOSCOPY WITH PROPOFOL N/A 08/02/2018   Procedure: COLONOSCOPY WITH PROPOFOL;  Surgeon: [name], MD;  Location: [facility] ENDOSCOPY;  Service: Endoscopy;  Laterality: N/A;  . DILATION AND  CURETTAGE OF UTERUS    . goiter surgery    . MASTECTOMY Left   . OOPHORECTOMY    . PORTACATH PLACEMENT Left 07/10/2019   Procedure: INSERTION PORT-A-CATH;  Surgeon: Jules Husbands, MD;  Location: [facility] ORS;  Service: General;  Laterality: Left;  . SIMPLE MASTECTOMY WITH AXILLARY SENTINEL NODE BIOPSY Left 07/10/2019   Procedure: SIMPLE MASTECTOMY.; LEFT;  Surgeon: Jules Husbands, MD;  Location: [facility] ORS;  Service: General;  Laterality: Left;  . THYROIDECTOMY, PARTIAL      Social History   Socioeconomic History  . Marital status: Divorced    Spouse name: Not on file  . Number of children: Not on file  . Years of education: Not on file  . Highest education level: Not on file  Occupational History  . Not on file  Tobacco Use  . Smoking status: Never Smoker  . Smokeless tobacco: Never Used  Substance and Sexual Activity  . Alcohol use: No  . Drug use: No  . Sexual activity: Not Currently  Other Topics Concern  . Not on file  Social History Narrative  . Not on file   Social Determinants of Health   Financial Resource Strain:   . Difficulty of Paying Living Expenses:   Food Insecurity:   . Worried About Charity fundraiser in the Last Year:   . Arboriculturist in the Last Year:   Transportation Needs:   . Film/video editor (Medical):   Marland Kitchen Lack of Transportation (Non-Medical):   Physical Activity:   .  Days of Exercise per Week:   . Minutes of Exercise per Session:   Stress:   . Feeling of Stress :   Social Connections:   . Frequency of Communication with Friends and Family:   . Frequency of Social Gatherings with Friends and Family:   . Attends Religious Services:   . Active Member of Clubs or Organizations:   . Attends Archivist Meetings:   Marland Kitchen Marital Status:   Intimate Partner Violence:   . Fear of Current or Ex-Partner:   . Emotionally Abused:   Marland Kitchen Physically Abused:   . Sexually Abused:     Family History  Problem Relation Age of Onset  . Breast  cancer Cousin        mat cousin  . Hypertension Mother   . Heart attack Father   . Prostate cancer Father   . Diabetes Father   . Colon polyps Father      Current Outpatient Medications:  .  acetaminophen (TYLENOL) 500 MG tablet, Take 500 mg by mouth every 6 (six) hours as needed (for pain/headaches.)., Disp: , Rfl:  .  anastrozole (ARIMIDEX) 1 MG tablet, TAKE 1 TABLET BY MOUTH EVERY DAY, Disp: 30 tablet, Rfl: 2 .  aspirin EC 81 MG tablet, Take 81 mg by mouth daily., Disp: , Rfl:  .  atorvastatin (LIPITOR) 10 MG tablet, Take 10 mg by mouth daily. , Disp: , Rfl:  .  Calcium Carbonate-Vitamin D 600-400 MG-UNIT tablet, Take 2 tablets by mouth daily. , Disp: , Rfl:  .  lisinopril-hydrochlorothiazide (PRINZIDE,ZESTORETIC) 20-12.5 MG tablet, Take 1 tablet by mouth daily., Disp: , Rfl:  .  Multiple Vitamin (MULTIVITAMIN WITH MINERALS) TABS tablet, Take 1 tablet by mouth daily after breakfast. Centrum Silver, Disp: , Rfl:   Physical exam:  Vitals:   04/23/20 1012  BP: 123/68  Pulse: 77  Resp: 18  Temp: 98.3 F (36.8 C)  TempSrc: Oral  SpO2: 98%  Weight: 207 lb 1.6 oz (93.9 kg)   Physical Exam Constitutional:      General: She is not in acute distress. Cardiovascular:     Rate and Rhythm: Normal rate and regular rhythm.     Heart sounds: Normal heart sounds.  Pulmonary:     Effort: Pulmonary effort is normal.     Breath sounds: Normal breath sounds.  Abdominal:     General: Bowel sounds are normal.     Palpations: Abdomen is soft.  Skin:    General: Skin is warm and dry.  Neurological:     Mental Status: She is alert and oriented to person, place, and time.    Breast exam is performed in seated and lying down position. Patient is status post left mastectomy without reconstruction. No evidence of any chest wall recurrence. No evidence of bilateral axillary adenopathy no palpable masses in the right breast   CMP Latest Ref Rng & Units 04/23/2020  Glucose 70 - 99 mg/dL 160(H)   BUN 8 - 23 mg/dL 15  Creatinine 0.44 - 1.00 mg/dL 0.77  Sodium 135 - 145 mmol/L 137  Potassium 3.5 - 5.1 mmol/L 3.8  Chloride 98 - 111 mmol/L 100  CO2 22 - 32 mmol/L 27  Calcium 8.9 - 10.3 mg/dL 9.2  Total Protein 6.5 - 8.1 g/dL 7.2  Total Bilirubin 0.3 - 1.2 mg/dL 0.6  Alkaline Phos 38 - 126 U/L 72  AST 15 - 41 U/L 49(H)  ALT 0 - 44 U/L 41   CBC Latest Ref Rng &  Units 04/23/2020  WBC 4.0 - 10.5 K/uL 6.0  Hemoglobin 12.0 - 15.0 g/dL 12.2  Hematocrit 36.0 - 46.0 % 34.2(L)  Platelets 150 - 400 K/uL 279      Assessment and plan- Patient is a 70 y.o. female with invasive mammary carcinoma of the left breast pathological prognostic stage I [id number] ER PR positive HER-2/neu negative status post lumpectomy and sentinel lymph node biopsy.She is high risk mammprint score and s/p mastectomygiven concerns for residual disease.She is s/p 4 cycles of adjuvant TC chemotherapy and adjuvant radiation treatment.    She is here for routine follow-up of breast cancer on Arimidex  Currently tolerating Arimidex well which she will continue to take along with calcium and vitamin D for 10 years given her high risk MammaPrint.  Clinically doing well and no concerning signs and symptoms of recurrence based on today's exam.  She will be due for a mammogram this month.  I will see her back in 3 months when she receives her next Zometa infusion   Visit Diagnosis 1. Encounter for follow-up surveillance of breast cancer   2. Visit for monitoring Arimidex therapy      Dr. Randa Evens, MD, MPH [facility] at [facility] [phone] 04/25/2020 8:23 AM

## 2020-04-29 ENCOUNTER — Ambulatory Visit
Admission: RE | Admit: 2020-04-29 | Discharge: 2020-04-29 | Disposition: A | Discharge status: Home / Self Care | Payer: Medicare Other | Source: Ambulatory Visit | Attending: Obstetrics & Gynecology | Admitting: Obstetrics & Gynecology

## 2020-04-29 ENCOUNTER — Other Ambulatory Visit: Payer: Self-pay

## 2020-04-29 DIAGNOSIS — Z1231 Encounter for screening mammogram for malignant neoplasm of breast: Secondary | ICD-10-CM

## 2020-04-29 HISTORY — DX: Malignant neoplasm of unspecified site of unspecified female breast: C50.919

## 2020-04-29 HISTORY — DX: Personal history of irradiation: Z92.3

## 2020-05-08 ENCOUNTER — Other Ambulatory Visit: Payer: Self-pay

## 2020-05-08 ENCOUNTER — Ambulatory Visit (INDEPENDENT_AMBULATORY_CARE_PROVIDER_SITE_OTHER): Payer: Medicare Other | Admitting: Physician Assistant

## 2020-05-08 ENCOUNTER — Encounter: Payer: Self-pay | Admitting: Surgery

## 2020-05-08 DIAGNOSIS — Z170 Estrogen receptor positive status [ER+]: Secondary | ICD-10-CM | POA: Diagnosis not present

## 2020-05-08 DIAGNOSIS — Z09 Encounter for follow-up examination after completed treatment for conditions other than malignant neoplasm: Secondary | ICD-10-CM | POA: Diagnosis not present

## 2020-05-08 DIAGNOSIS — C50412 Malignant neoplasm of upper-outer quadrant of left female breast: Secondary | ICD-10-CM

## 2020-05-08 NOTE — Patient Instructions (Addendum)
Patient will continue following with yearly mammograms.   Follow-up with our office as needed.   Please call and ask to speak with a nurse if you develop questions or concerns.  Breast Self-Awareness Breast self-awareness means being familiar with how your breasts look and feel. It involves checking your breasts regularly and reporting any changes to your health care provider. Practicing breast self-awareness is important. Sometimes changes may not be harmful (are benign), but sometimes a change in your breasts can be a sign of a serious medical problem. It is important to learn how to do this procedure correctly so that you can catch problems early, when treatment is more likely to be successful. All women should practice breast self-awareness, including women who have had breast implants. What you need:  A mirror.  A well-lit room. How to do a breast self-exam A breast self-exam is one way to learn what is normal for your breasts and whether your breasts are changing. To do a breast self-exam: Look for changes  1. Remove all the clothing above your waist. 2. Stand in front of a mirror in a room with good lighting. 3. Put your hands on your hips. 4. Push your hands firmly downward. 5. Compare your breasts in the mirror. Look for differences between them (asymmetry), such as: ? Differences in shape. ? Differences in size. ? Puckers, dips, and bumps in one breast and not the other. 6. Look at each breast for changes in the skin, such as: ? Redness. ? Scaly areas. 7. Look for changes in your nipples, such as: ? Discharge. ? Bleeding. ? Dimpling. ? Redness. ? A change in position. Feel for changes Carefully feel your breasts for lumps and changes. It is best to do this while lying on your back on the floor, and again while sitting or standing in the tub or shower with soapy water on your skin. Feel each breast in the following way: 1. Place the arm on the side of the breast you are  examining above your head. 2. Feel your breast with the other hand. 3. Start in the nipple area and make -inch (2 cm) overlapping circles to feel your breast. Use the pads of your three middle fingers to do this. Apply light pressure, then medium pressure, then firm pressure. The light pressure will allow you to feel the tissue closest to the skin. The medium pressure will allow you to feel the tissue that is a little deeper. The firm pressure will allow you to feel the tissue close to the ribs. 4. Continue the overlapping circles, moving downward over the breast until you feel your ribs below your breast. 5. Move one finger-width toward the center of the body. Continue to use the -inch (2 cm) overlapping circles to feel your breast as you move slowly up toward your collarbone. 6. Continue the up-and-down exam using all three pressures until you reach your armpit.  Write down what you find Writing down what you find can help you remember what to discuss with your health care provider. Write down:  What is normal for each breast.  Any changes that you find in each breast, including: ? The kind of changes you find. ? Any pain or tenderness. ? Size and location of any lumps.  Where you are in your menstrual cycle, if you are still menstruating. General tips and recommendations  Examine your breasts every month.  If you are breastfeeding, the best time to examine your breasts is after a feeding  or after using a breast pump.  If you menstruate, the best time to examine your breasts is 5-7 days after your period. Breasts are generally lumpier during menstrual periods, and it may be more difficult to notice changes.  With time and practice, you will become more familiar with the variations in your breasts and more comfortable with the exam. Contact a health care provider if you:  See a change in the shape or size of your breasts or nipples.  See a change in the skin of your breast or  nipples, such as a reddened or scaly area.  Have unusual discharge from your nipples.  Find a lump or thick area that was not there before.  Have pain in your breasts.  Have any concerns related to your breast health. Summary  Breast self-awareness includes looking for physical changes in your breasts, as well as feeling for any changes within your breasts.  Breast self-awareness should be performed in front of a mirror in a well-lit room.  You should examine your breasts every month. If you menstruate, the best time to examine your breasts is 5-7 days after your menstrual period.  Let your health care provider know of any changes you notice in your breasts, including changes in size, changes on the skin, pain or tenderness, or unusual fluid from your nipples. This information is not intended to replace advice given to you by your health care provider. Make sure you discuss any questions you have with your health care provider. Document Revised: 06/28/2018 Document Reviewed: 06/28/2018 Elsevier Patient Education  2020 Elsevier In

## 2020-05-08 NOTE — Progress Notes (Signed)
[facility] NOTE  05/08/2020  History of Present Illness: Amanda Wade is a 69 y.o. female with a history of extensive left DCIS with invasive mammary carcinoma of the left breast s/p left simple mastectomy on 08/17 with Dr Dahlia Byes. She has been following up with oncology (Dr Janese Banks) and doing well. She was since completed chemotherapy and radiation and was started on hormone therapy (Arimidex) in December 2020. She was last seen in our clinic on 12/11/2019 for port removal. Last seen in oncology clinic on 06/01, notes reviewed.   Today, she reports that she is doing well. She notes occasional pulling sensation around her incision when reaching out to grab something but this is very sporadic and typically resolves spontaneously. She is not taking anything for pain. No reported fever, chills, cough, CP, SOB, nausea, emesis, or urinary/bowel changes. She denied any issues with hormone therapy. Her most recent CBC and CMP are very reassuring and essentially unremarkable. She continues to do self breast examinations of her right breast at home. Screening mammogram done through her gynecologist on 06/07 negative, BI-RADS 1. No other issues.      Past Medical History: Past Medical History:  Diagnosis Date  . Breast cancer ([place])    left breast  . Complication of anesthesia   . Hyperlipidemia   . Hypertension   . Malignant neoplasm of upper-outer quadrant of left breast in female, estrogen receptor positive ([place]) 05/19/2019  . Personal history of chemotherapy   . Personal history of radiation therapy   . PONV (postoperative nausea and vomiting)   . Vertigo    intermittent especially lying on left side     Past Surgical History: Past Surgical History:  Procedure Laterality Date  . ABDOMINAL HYSTERECTOMY  2004   complete  . BREAST BIOPSY Left 05/05/2019   [facility]- ribbon clip  . BREAST BIOPSY Left 05/16/2019   us-DCIS  . BREAST BIOPSY Left 05/16/2019   [facility]- X clip   . BREAST BIOPSY WITH SENTINEL LYMPH NODE BIOPSY AND NEEDLE LOCALIZATION Left 06/05/2019   Procedure: BREAST BIOPSY WITH SENTINEL LYMPH NODE BIOPSY AND NEEDLE LOCALIZATION, LEFT, BRACKETING WIRE;  Surgeon: Robert Bellow, MD;  Location: [facility] ORS;  Service: General;  Laterality: Left;  . BREAST CYST ASPIRATION Bilateral age 14   neg  . BREAST LUMPECTOMY Left 05/2019   positive  . CHOLECYSTECTOMY    . COLONOSCOPY WITH PROPOFOL N/A 08/02/2018   Procedure: COLONOSCOPY WITH PROPOFOL;  Surgeon: [name], MD;  Location: [facility] ENDOSCOPY;  Service: Endoscopy;  Laterality: N/A;  . DILATION AND CURETTAGE OF UTERUS    . goiter surgery    . MASTECTOMY Left 07/10/2019  . OOPHORECTOMY    . PORTACATH PLACEMENT Left 07/10/2019   Procedure: INSERTION PORT-A-CATH;  Surgeon: Jules Husbands, MD;  Location: [facility] ORS;  Service: General;  Laterality: Left;  . SIMPLE MASTECTOMY WITH AXILLARY SENTINEL NODE BIOPSY Left 07/10/2019   Procedure: SIMPLE MASTECTOMY.; LEFT;  Surgeon: Jules Husbands, MD;  Location: [facility] ORS;  Service: General;  Laterality: Left;  . THYROIDECTOMY, PARTIAL      Home Medications: Prior to Admission medications   Medication Sig Start Date End Date Taking? Authorizing Provider  acetaminophen (TYLENOL) 500 MG tablet Take 500 mg by mouth every 6 (six) hours as needed (for pain/headaches.).   Yes [provider]  anastrozole (ARIMIDEX) 1 MG tablet TAKE 1 TABLET BY MOUTH EVERY DAY 03/08/20  Yes Sindy Guadeloupe, MD  aspirin EC 81 MG tablet  Take 81 mg by mouth daily.   Yes [provider]  atorvastatin (LIPITOR) 10 MG tablet Take 10 mg by mouth daily.    Yes [provider]  Calcium Carb-Cholecalciferol 600-800 MG-UNIT TABS Take by mouth.   Yes [provider]  Calcium Carbonate-Vitamin D 600-400 MG-UNIT tablet Take 2 tablets by mouth daily.    Yes [provider]  lisinopril-hydrochlorothiazide (PRINZIDE,ZESTORETIC) 20-12.5 MG tablet Take 1  tablet by mouth daily.   Yes [provider]  Multiple Vitamin (MULTIVITAMIN WITH MINERALS) TABS tablet Take 1 tablet by mouth daily after breakfast. Centrum Silver   Yes [provider]    Allergies: Allergies  Allergen Reactions  . Shellfish Allergy Rash  . Sulfa Antibiotics Rash    Review of Systems: Review of Systems  Constitutional: Negative for chills, fever, malaise/fatigue and weight loss.  HENT: Negative for congestion and sore throat.   Respiratory: Negative for cough and shortness of breath.   Cardiovascular: Negative for chest pain and palpitations.  Gastrointestinal: Negative for abdominal pain, nausea and vomiting.  Genitourinary: Negative for dysuria and urgency.  All other systems reviewed and are negative.   Physical Exam There were no vitals taken for this visit.  Physical Exam Vitals and nursing note reviewed. Exam conducted with a chaperone present.  Constitutional:      General: She is not in acute distress.    Appearance: Normal appearance. She is normal weight. She is not ill-appearing.  HENT:     Head: Normocephalic and atraumatic.  Eyes:     Conjunctiva/sclera: Conjunctivae normal.     Pupils: Pupils are equal, round, and reactive to light.  Cardiovascular:     Rate and Rhythm: Normal rate and regular rhythm.     Pulses: Normal pulses.     Heart sounds: No murmur heard.   Pulmonary:     Effort: Pulmonary effort is normal. No respiratory distress.  Chest:    Genitourinary:    Comments: Deferred Skin:    General: Skin is warm and dry.     Coloration: Skin is not pale.     Findings: No erythema.  Neurological:     General: No focal deficit present.     Mental Status: She is alert and oriented to person, place, and time.  Psychiatric:        Mood and Affect: Mood normal.        Behavior: Behavior normal.     Labs/Imaging:  Screening Mammogram (04/29/2020) personally reviewed and no concerning masses or lesions  identified, and radiologist report reviewed below:  IMPRESSION: No mammographic evidence of malignancy. A result letter of this screening mammogram will be mailed directly to the patient.  RECOMMENDATION: Screening mammogram in one year.  (Code:SM-R-87M)  BI-RADS CATEGORY  1: Negative.   Assessment and Plan: This is a 69 y.o. female with a history of extensive left DCIS with invasive mammary carcinoma of the left breast s/p left simple mastectomy on 08/17 who has since completed adjuvant chemotherapy and radiation and now on hormone therapy.    - Nothing further identified from surgical perspective  - Continue hormone therapy per oncology; follow up as scheduled  - Continue screening mammograms yearly through gynecologist or PCP, we are also happy to order these if needed  - Continue self breast exams of the right breast.   - rtc prn  Face-to-face time spent with the patient and care providers was 15 minutes, with more than 50% of the time spent counseling, educating, and  coordinating care of the patient.     Edison Simon, PA-C  [facility] 05/08/2020, 11:48 AM [phone] M-F: 7am - 4pm

## 2020-05-30 ENCOUNTER — Other Ambulatory Visit: Payer: Self-pay | Admitting: Oncology

## 2020-06-12 DIAGNOSIS — M65311 Trigger thumb, right thumb: Secondary | ICD-10-CM | POA: Insufficient documentation

## 2020-06-19 ENCOUNTER — Other Ambulatory Visit: Payer: Self-pay | Admitting: Surgery

## 2020-06-25 ENCOUNTER — Other Ambulatory Visit: Payer: Self-pay

## 2020-06-25 ENCOUNTER — Encounter
Admission: RE | Admit: 2020-06-25 | Discharge: 2020-06-25 | Disposition: A | Discharge status: Home / Self Care | Payer: Medicare Other | Source: Ambulatory Visit | Attending: Surgery | Admitting: Surgery

## 2020-06-25 HISTORY — DX: Prediabetes: R73.03

## 2020-06-25 HISTORY — DX: Headache, unspecified: R51.9

## 2020-06-25 NOTE — Patient Instructions (Signed)
Your procedure is scheduled on: Wednesday July 03, 2020. Report to Day Surgery inside [facility] 2nd floor. To find out your arrival time please call [phone] between 1PM - 3PM on Tuesday July 02, 2020.  Remember: Instructions that are not followed completely may result in serious medical risk,  up to and including death, or upon the discretion of your surgeon and anesthesiologist your  surgery may need to be rescheduled.     _X__ 1. Do not eat food after midnight the night before your procedure.                 No gum chewing or hard candies. You may drink clear liquids up to 2 hours                 before you are scheduled to arrive for your surgery- DO not drink clear                 liquids within 2 hours of the start of your surgery.                 Clear Liquids include:  water, apple juice without pulp, clear Gatorade, G2 or                  Gatorade Zero (avoid Red/Purple/Blue), Black Coffee or Tea (Do not add                 anything to coffee or tea).  __X__2.  Complete the carbohydrate drink provided to you, 2 hours before arrival.  __X__3.  On the morning of surgery brush your teeth with toothpaste and water, you                may rinse your mouth with mouthwash if you wish.  Do not swallow any toothpaste of mouthwash.     _X__ 4.  No Alcohol for 24 hours before or after surgery.   _X__ 5.  Do Not Smoke or use e-cigarettes For 24 Hours Prior to Your Surgery.                 Do not use any chewable tobacco products for at least 6 hours prior to                 Surgery.  _X__  6.  Do not use any recreational drugs (marijuana, cocaine, heroin, ecstacy, MDMA or other)                For at least one week prior to your surgery.  Combination of these drugs with anesthesia                May have life threatening results.  __X__ 7.  Notify your doctor if there is any change in your medical condition      (cold, fever, infections).     Do not  wear jewelry, make-up, hairpins, clips or nail polish. Do not wear lotions, powders, or perfumes. You may wear deodorant. Do not shave 48 hours prior to surgery. Men may shave face and neck. Do not bring valuables to the hospital.    [facility] is not responsible for any belongings or valuables.  Contacts, dentures or bridgework may not be worn into surgery. Leave your suitcase in the car. After surgery it may be brought to your room. For patients admitted to the hospital, discharge time is determined by your treatment team.   Patients discharged the day of surgery will  not be allowed to drive home.   Make arrangements for someone to be with you for the first 24 hours of your Same Day Discharge.   __X__ Take these medicines the morning of surgery with A SIP OF WATER:    1.anastrozole (ARIMIDEX) 1 MG     __X__ Use CHG Soap as directed  __X__ Stop aspirin 7 days before your surgery.   __X__ Stop Anti-inflammatories such Ibuprofen, Aleve, Advil, naproxen, and or BC powders.   __X__ Stop supplements until after surgery.    __X__ Do not start any herbal supplements before your surgery.

## 2020-07-01 ENCOUNTER — Other Ambulatory Visit: Payer: Medicare Other

## 2020-07-03 ENCOUNTER — Ambulatory Visit: Admission: RE | Admit: 2020-07-03 | Payer: Medicare Other | Source: Home / Self Care | Admitting: Surgery

## 2020-07-03 ENCOUNTER — Encounter: Admission: RE | Payer: Self-pay | Source: Home / Self Care

## 2020-07-03 SURGERY — RELEASE, A1 PULLEY, FOR TRIGGER FINGER
Anesthesia: Choice | Laterality: Right

## 2020-07-15 ENCOUNTER — Ambulatory Visit: Payer: Medicare Other | Admitting: Radiation Oncology

## 2020-07-22 ENCOUNTER — Other Ambulatory Visit: Payer: Self-pay

## 2020-07-22 ENCOUNTER — Encounter: Payer: Self-pay | Admitting: Radiation Oncology

## 2020-07-22 ENCOUNTER — Ambulatory Visit
Admission: RE | Admit: 2020-07-22 | Discharge: 2020-07-22 | Disposition: A | Discharge status: Home / Self Care | Payer: Medicare Other | Source: Ambulatory Visit | Attending: Radiation Oncology | Admitting: Radiation Oncology

## 2020-07-22 VITALS — BP 132/91 | HR 85 | Wt 208.0 lb

## 2020-07-22 DIAGNOSIS — C50412 Malignant neoplasm of upper-outer quadrant of left female breast: Secondary | ICD-10-CM

## 2020-07-22 NOTE — Progress Notes (Signed)
Radiation Oncology Follow up Note  Name: Amanda Wade   Date:   07/22/2020 MRN:  [id number] DOB: 12/10/1950    This 69 y.o. female presents to the clinic today for 42-monthfollow-up status post left chest wall radiation.  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  Seen today in routine follow-up she is doing well she occasionally has some twinges in the left chest wall consistent with scarring.  She has no other complaints..  She is currently on Arimidex tolerating it well without side effects.  In June she had a right mammogram which was negative and benign which I have reviewed.  COMPLICATIONS OF TREATMENT: none  FOLLOW UP COMPLIANCE: keeps appointments   PHYSICAL EXAM:  BP (!) 132/91 (BP Location: Left Arm, Patient Position: Sitting, Cuff Size: Normal)   Pulse 85   Wt 208 lb (94.3 kg)   BMI 39.30 kg/m  Patient status post left modified radical mastectomy.  No evidence of chest wall mass or nodularity is noted she has no swelling in her left upper extremity.  Right breast is free of dominant mass or nodularity.  No axillary or supraclavicular adenopathy is appreciated.  Well-developed well-nourished patient in NAD. HEENT reveals PERLA, EOMI, discs not visualized.  Oral cavity is clear. No oral mucosal lesions are identified. Neck is clear without evidence of cervical or supraclavicular adenopathy. Lungs are clear to A&P. Cardiac examination is essentially unremarkable with regular rate and rhythm without murmur rub or thrill. Abdomen is benign with no organomegaly or masses noted. Motor sensory and DTR levels are equal and symmetric in the upper and lower extremities. Cranial nerves II through  XII are grossly intact. Proprioception is intact. No peripheral adenopathy or edema is identified. No motor or sensory levels are noted. Crude visual fields are within normal range.  RADIOLOGY RESULTS: Mammogram of her right breast is reviewed compatible with above-stated findings  PLAN: Patient now seen out 6 months doing well with no evidence of disease.  And pleased with her overall progress.  She continues on Arimidex without side effect.  I have asked to see her back in 6 months for follow-up.  Patient knows to call sooner with any concerns.  I would like to take this opportunity to thank you for allowing me to participate in the care of your patient..[name] MD

## 2020-07-25 ENCOUNTER — Encounter: Payer: Self-pay | Admitting: Oncology

## 2020-07-25 ENCOUNTER — Inpatient Hospital Stay: Payer: Medicare Other | Attending: Oncology

## 2020-07-25 ENCOUNTER — Other Ambulatory Visit: Payer: Medicare Other

## 2020-07-25 ENCOUNTER — Inpatient Hospital Stay: Payer: Medicare Other

## 2020-07-25 ENCOUNTER — Inpatient Hospital Stay (HOSPITAL_BASED_OUTPATIENT_CLINIC_OR_DEPARTMENT_OTHER): Payer: Medicare Other | Admitting: Oncology

## 2020-07-25 ENCOUNTER — Ambulatory Visit: Payer: Medicare Other

## 2020-07-25 ENCOUNTER — Other Ambulatory Visit: Payer: Self-pay

## 2020-07-25 ENCOUNTER — Ambulatory Visit: Payer: Medicare Other | Admitting: Oncology

## 2020-07-25 VITALS — BP 120/86 | HR 75 | Temp 98.6°F | Resp 16 | Wt 210.2 lb

## 2020-07-25 DIAGNOSIS — Z79811 Long term (current) use of aromatase inhibitors: Secondary | ICD-10-CM

## 2020-07-25 DIAGNOSIS — Z5181 Encounter for therapeutic drug level monitoring: Secondary | ICD-10-CM

## 2020-07-25 DIAGNOSIS — Z7982 Long term (current) use of aspirin: Secondary | ICD-10-CM | POA: Insufficient documentation

## 2020-07-25 DIAGNOSIS — Z90710 Acquired absence of both cervix and uterus: Secondary | ICD-10-CM | POA: Insufficient documentation

## 2020-07-25 DIAGNOSIS — Z90722 Acquired absence of ovaries, bilateral: Secondary | ICD-10-CM | POA: Diagnosis not present

## 2020-07-25 DIAGNOSIS — Z9079 Acquired absence of other genital organ(s): Secondary | ICD-10-CM | POA: Diagnosis not present

## 2020-07-25 DIAGNOSIS — E785 Hyperlipidemia, unspecified: Secondary | ICD-10-CM | POA: Diagnosis not present

## 2020-07-25 DIAGNOSIS — Z833 Family history of diabetes mellitus: Secondary | ICD-10-CM | POA: Diagnosis not present

## 2020-07-25 DIAGNOSIS — Z7983 Long term (current) use of bisphosphonates: Secondary | ICD-10-CM | POA: Insufficient documentation

## 2020-07-25 DIAGNOSIS — Z170 Estrogen receptor positive status [ER+]: Secondary | ICD-10-CM | POA: Diagnosis not present

## 2020-07-25 DIAGNOSIS — Z08 Encounter for follow-up examination after completed treatment for malignant neoplasm: Secondary | ICD-10-CM | POA: Diagnosis not present

## 2020-07-25 DIAGNOSIS — I10 Essential (primary) hypertension: Secondary | ICD-10-CM | POA: Insufficient documentation

## 2020-07-25 DIAGNOSIS — Z923 Personal history of irradiation: Secondary | ICD-10-CM | POA: Insufficient documentation

## 2020-07-25 DIAGNOSIS — Z853 Personal history of malignant neoplasm of breast: Secondary | ICD-10-CM | POA: Diagnosis not present

## 2020-07-25 DIAGNOSIS — Z8042 Family history of malignant neoplasm of prostate: Secondary | ICD-10-CM | POA: Diagnosis not present

## 2020-07-25 DIAGNOSIS — C50412 Malignant neoplasm of upper-outer quadrant of left female breast: Secondary | ICD-10-CM | POA: Insufficient documentation

## 2020-07-25 DIAGNOSIS — Z9221 Personal history of antineoplastic chemotherapy: Secondary | ICD-10-CM | POA: Diagnosis not present

## 2020-07-25 DIAGNOSIS — Z8249 Family history of ischemic heart disease and other diseases of the circulatory system: Secondary | ICD-10-CM | POA: Insufficient documentation

## 2020-07-25 DIAGNOSIS — Z803 Family history of malignant neoplasm of breast: Secondary | ICD-10-CM | POA: Diagnosis not present

## 2020-07-25 DIAGNOSIS — Z79899 Other long term (current) drug therapy: Secondary | ICD-10-CM | POA: Diagnosis not present

## 2020-07-25 DIAGNOSIS — Z9012 Acquired absence of left breast and nipple: Secondary | ICD-10-CM | POA: Insufficient documentation

## 2020-07-25 LAB — CBC WITH DIFFERENTIAL/PLATELET
Abs Immature Granulocytes: 0.06 10*3/uL (ref 0.00–0.07)
Basophils Absolute: 0.1 10*3/uL (ref 0.0–0.1)
Basophils Relative: 1 %
Eosinophils Absolute: 0.2 10*3/uL (ref 0.0–0.5)
Eosinophils Relative: 3 %
HCT: 33.6 % — ABNORMAL LOW (ref 36.0–46.0)
Hemoglobin: 12 g/dL (ref 12.0–15.0)
Immature Granulocytes: 1 %
Lymphocytes Relative: 18 %
Lymphs Abs: 1.2 10*3/uL (ref 0.7–4.0)
MCH: 30.2 pg (ref 26.0–34.0)
MCHC: 35.7 g/dL (ref 30.0–36.0)
MCV: 84.4 fL (ref 80.0–100.0)
Monocytes Absolute: 0.4 10*3/uL (ref 0.1–1.0)
Monocytes Relative: 6 %
Neutro Abs: 4.7 10*3/uL (ref 1.7–7.7)
Neutrophils Relative %: 71 %
Platelets: 245 10*3/uL (ref 150–400)
RBC: 3.98 MIL/uL (ref 3.87–5.11)
RDW: 12.9 % (ref 11.5–15.5)
WBC: 6.7 10*3/uL (ref 4.0–10.5)
nRBC: 0 % (ref 0.0–0.2)

## 2020-07-25 LAB — COMPREHENSIVE METABOLIC PANEL
ALT: 33 U/L (ref 0–44)
AST: 44 U/L — ABNORMAL HIGH (ref 15–41)
Albumin: 3.8 g/dL (ref 3.5–5.0)
Alkaline Phosphatase: 80 U/L (ref 38–126)
Anion gap: 12 (ref 5–15)
BUN: 13 mg/dL (ref 8–23)
CO2: 26 mmol/L (ref 22–32)
Calcium: 9.6 mg/dL (ref 8.9–10.3)
Chloride: 99 mmol/L (ref 98–111)
Creatinine, Ser: 0.95 mg/dL (ref 0.44–1.00)
GFR calc Af Amer: >60 mL/min (ref >60)
GFR calc non Af Amer: >60 mL/min (ref >60)
Glucose, Bld: 198 mg/dL — ABNORMAL HIGH (ref 70–99)
Potassium: 3.8 mmol/L (ref 3.5–5.1)
Sodium: 137 mmol/L (ref 135–145)
Total Bilirubin: 0.4 mg/dL (ref 0.3–1.2)
Total Protein: 7.1 g/dL (ref 6.5–8.1)

## 2020-07-25 MED ORDER — SODIUM CHLORIDE 0.9 % IV SOLN
Freq: Once | INTRAVENOUS | Status: AC
  Filled 2020-07-25: qty 250

## 2020-07-25 MED ORDER — ZOLEDRONIC ACID 4 MG/100ML IV SOLN
4.0000 mg | INTRAVENOUS | Status: DC
  Administered 2020-07-25: 4 mg via INTRAVENOUS
  Filled 2020-07-25: qty 100

## 2020-07-25 NOTE — Progress Notes (Signed)
Hematology/Oncology Consult note [facility]  Telephone:([phone] [phone]  Patient Care Team: Ezequiel Kayser, MD as PCP - General (Internal Medicine) Sindy Guadeloupe, MD as Consulting Physician (Oncology) Bary Castilla, Forest Gleason, MD as Consulting Physician (General Surgery) Rico Junker, RN as Oncology Nurse Navigator [name], MD as Referring Physician (Radiation Oncology)   Name of the patient: Amanda Wade  [id number]  [birth year]   Date of visit: 07/25/20  Diagnosis-  pathological prognostic stage IA mpT1b mpN1aInvasive mammary carcinoma of the left breast status post lumpectomy and sentinel lymph node biopsy  Chief complaint/ Reason for visit-routine follow-up of breast cancer  Heme/Onc history: Patient is a 69 year old postmenopausal female with a past medical history significant for hypertension hyperlipidemia among other medical problems. She recently underwent a screening mammogram on 04/28/2019 which showed possible mass in the left breast. This was followed by diagnostic mammogram and ultrasound which showed bilobar hypo-Echoic mass at the 2:30 position of the left breast measuring 0.5 x 0.6 x 1.1 cm. Hypoechoic mass at the 2 o'clock position as well. And another bilobed irregular hypoechoic mass at the 3 o'clock position measuring 4 x 5 x 6 mm. Patient had biopsy of all these 3 masses. The 3 o'clock position breast mass came back as invasive mammary carcinoma 6 mm, grade 1 strongly ER PR positive greater than 90% and HER-2/neu negative. 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 230 breast mass also came back as intermediate grade DCIS. There was also another mass noted in the left breast which was biopsied and was 5 mm grade 1 ER PR positive and HER-2/neu negative.  Patient was taken for lumpectomy and sentinel lymph node biopsy by Dr. Bary Castilla on 06/05/2019.Final pathology showed invasive mammary carcinoma grade 1  ER PR positive and HER-2/neu negative at 4 different biopsy sites with extensive DCIS predominantly micropapillary type. One lymph node was also involved with macro metastatic carcinoma and was negative for extranodal extension  Patient's case was discussed at tumor board and there was a concern that there may be potentially more areas of abnormality given extensive DCIS and invasive cancer seen in multiple foci in the lumpectomy specimen. Plan was to proceed with bilateral MRI.Breast MRI showed a large area of clumped discontinued non-mass enhancement in the left upper breast spanning an area of 9.8 cm in AP dimension suspicious for residual DCIS or invasive carcinoma.Patient therefore proceeded with r left mastectomy which showed small area of residual DCIS but no evidence of invasive malignancy.  Patient also had a MammaPrint testing done on her lumpectomy specimen which came back as high risk group. 29% chance of recurrence without chemotherapy at 5 years. Greater than 12% chemo benefit and estimated >93% survival at 5 years with chemotherapy and hormone therapy  Patient completed 4 cycles of adjuvant TC chemotherapy on 10/26/2019. Shecompletedadjuvant radiation treatment.Patient started taking Arimidex in December 2020   Interval history-she currently reports doing well and denies any new aches or pains.  She is tolerating Arimidex along with calcium and vitamin D well.  Appetite and weight have remained stable.  She is taking her routine Covid precautions in her day-to-day life.  ECOG PS- 1 Pain scale- 0   Review of systems- Review of Systems  Constitutional: Negative for chills, fever, malaise/fatigue and weight loss.  HENT: Negative for congestion, ear discharge and nosebleeds.   Eyes: Negative for blurred vision.  Respiratory: Negative for cough, hemoptysis, sputum production, shortness of breath and wheezing.  Cardiovascular: Negative for chest pain, palpitations,  orthopnea and claudication.  Gastrointestinal: Negative for abdominal pain, blood in stool, constipation, diarrhea, heartburn, melena, nausea and vomiting.  Genitourinary: Negative for dysuria, flank pain, frequency, hematuria and urgency.  Musculoskeletal: Negative for back pain, joint pain and myalgias.  Skin: Negative for rash.  Neurological: Negative for dizziness, tingling, focal weakness, seizures, weakness and headaches.  Endo/Heme/Allergies: Does not bruise/bleed easily.  Psychiatric/Behavioral: Negative for depression and suicidal ideas. The patient does not have insomnia.        Allergies  Allergen Reactions  . Shellfish Allergy Rash  . Sulfa Antibiotics Rash     Past Medical History:  Diagnosis Date  . Breast cancer ([place])    left breast  . Complication of anesthesia   . Headache   . Hyperlipidemia   . Hypertension   . Malignant neoplasm of upper-outer quadrant of left breast in female, estrogen receptor positive ([place]) 05/19/2019  . Personal history of chemotherapy   . Personal history of radiation therapy   . PONV (postoperative nausea and vomiting) 1987  . Pre-diabetes   . Vertigo    intermittent especially lying on left side     Past Surgical History:  Procedure Laterality Date  . ABDOMINAL HYSTERECTOMY  2004   complete  . BREAST BIOPSY Left 05/05/2019   [facility]- ribbon clip  . BREAST BIOPSY Left 05/16/2019   [facility]-DCIS  . BREAST BIOPSY Left 05/16/2019   [facility]- X clip  . BREAST BIOPSY WITH SENTINEL LYMPH NODE BIOPSY AND NEEDLE LOCALIZATION Left 06/05/2019   Procedure: BREAST BIOPSY WITH SENTINEL LYMPH NODE BIOPSY AND NEEDLE LOCALIZATION, LEFT, BRACKETING WIRE;  Surgeon: Robert Bellow, MD;  Location: [facility] ORS;  Service: General;  Laterality: Left;  . BREAST CYST ASPIRATION Bilateral age 55   neg  . BREAST LUMPECTOMY Left 05/2019   positive  . CHOLECYSTECTOMY    . COLONOSCOPY WITH PROPOFOL N/A 08/02/2018   Procedure: COLONOSCOPY WITH PROPOFOL;  Surgeon:  [name], MD;  Location: [facility] ENDOSCOPY;  Service: Endoscopy;  Laterality: N/A;  . DILATION AND CURETTAGE OF UTERUS    . goiter surgery    . MASTECTOMY Left 07/10/2019  . OOPHORECTOMY    . PORTACATH PLACEMENT Left 07/10/2019   Procedure: INSERTION PORT-A-CATH;  Surgeon: Jules Husbands, MD;  Location: [facility] ORS;  Service: General;  Laterality: Left;  . SIMPLE MASTECTOMY WITH AXILLARY SENTINEL NODE BIOPSY Left 07/10/2019   Procedure: SIMPLE MASTECTOMY.; LEFT;  Surgeon: Jules Husbands, MD;  Location: [facility] ORS;  Service: General;  Laterality: Left;  . THYROIDECTOMY, PARTIAL      Social History   Socioeconomic History  . Marital status: Divorced    Spouse name: Not on file  . Number of children: Not on file  . Years of education: Not on file  . Highest education level: Not on file  Occupational History  . Not on file  Tobacco Use  . Smoking status: Never Smoker  . Smokeless tobacco: Never Used  Vaping Use  . Vaping Use: Never used  Substance and Sexual Activity  . Alcohol use: No    Comment: social and rarely   . Drug use: No  . Sexual activity: Not Currently  Other Topics Concern  . Not on file  Social History Narrative  . Not on file   Social Determinants of Health   Financial Resource Strain:   . Difficulty of Paying Living Expenses: Not on file  Food Insecurity:   . Worried About [employer] of  Food in the Last Year: Not on file  . Ran Out of Food in the Last Year: Not on file  Transportation Needs:   . Lack of Transportation (Medical): Not on file  . Lack of Transportation (Non-Medical): Not on file  Physical Activity:   . Days of Exercise per Week: Not on file  . Minutes of Exercise per Session: Not on file  Stress:   . Feeling of Stress : Not on file  Social Connections:   . Frequency of Communication with Friends and Family: Not on file  . Frequency of Social Gatherings with Friends and Family: Not on file  . Attends Religious Services: Not on file  .  Active Member of Clubs or Organizations: Not on file  . Attends Archivist Meetings: Not on file  . Marital Status: Not on file  Intimate Partner Violence:   . Fear of Current or Ex-Partner: Not on file  . Emotionally Abused: Not on file  . Physically Abused: Not on file  . Sexually Abused: Not on file    Family History  Problem Relation Age of Onset  . Breast cancer Cousin        mat cousin  . Hypertension Mother   . Heart attack Father   . Prostate cancer Father   . Diabetes Father   . Colon polyps Father      Current Outpatient Medications:  .  acetaminophen (TYLENOL) 500 MG tablet, Take 500 mg by mouth every 6 (six) hours as needed (for pain/headaches.)., Disp: , Rfl:  .  anastrozole (ARIMIDEX) 1 MG tablet, TAKE 1 TABLET BY MOUTH EVERY DAY (Patient taking differently: Take 1 mg by mouth daily. ), Disp: 30 tablet, Rfl: 2 .  aspirin EC 81 MG tablet, Take 81 mg by mouth daily., Disp: , Rfl:  .  atorvastatin (LIPITOR) 10 MG tablet, Take 10 mg by mouth daily. , Disp: , Rfl:  .  Calcium Carbonate-Vitamin D 600-400 MG-UNIT tablet, Take 2 tablets by mouth daily. , Disp: , Rfl:  .  lisinopril-hydrochlorothiazide (PRINZIDE,ZESTORETIC) 20-12.5 MG tablet, Take 1 tablet by mouth daily., Disp: , Rfl:  .  Multiple Vitamin (MULTIVITAMIN WITH MINERALS) TABS tablet, Take 1 tablet by mouth daily after breakfast. Centrum Silver, Disp: , Rfl:   Physical exam:  Vitals:   07/25/20 0852  BP: 120/86  Pulse: 75  Resp: 16  Temp: 98.6 F (37 C)  TempSrc: Tympanic  SpO2: 96%  Weight: 210 lb 3.2 oz (95.3 kg)   Physical Exam HENT:     Head: Normocephalic and atraumatic.  Eyes:     Pupils: Pupils are equal, round, and reactive to light.  Cardiovascular:     Rate and Rhythm: Normal rate and regular rhythm.     Heart sounds: Normal heart sounds.  Pulmonary:     Effort: Pulmonary effort is normal.     Breath sounds: Normal breath sounds.  Abdominal:     General: Bowel sounds are  normal.     Palpations: Abdomen is soft.  Musculoskeletal:     Cervical back: Normal range of motion.  Skin:    General: Skin is warm and dry.  Neurological:     Mental Status: She is alert and oriented to person, place, and time.     Breast exam: Patient is s/p left mastectomy without reconstruction with a well-healed surgical scar.  No palpable left axillary adenopathy.  No palpable right axillary adenopathy.  No palpable masses in the right breast.   CMP  Latest Ref Rng & Units 04/23/2020  Glucose 70 - 99 mg/dL 160(H)  BUN 8 - 23 mg/dL 15  Creatinine 0.44 - 1.00 mg/dL 0.77  Sodium 135 - 145 mmol/L 137  Potassium 3.5 - 5.1 mmol/L 3.8  Chloride 98 - 111 mmol/L 100  CO2 22 - 32 mmol/L 27  Calcium 8.9 - 10.3 mg/dL 9.2  Total Protein 6.5 - 8.1 g/dL 7.2  Total Bilirubin 0.3 - 1.2 mg/dL 0.6  Alkaline Phos 38 - 126 U/L 72  AST 15 - 41 U/L 49(H)  ALT 0 - 44 U/L 41   CBC Latest Ref Rng & Units 07/25/2020  WBC 4.0 - 10.5 K/uL 6.7  Hemoglobin 12.0 - 15.0 g/dL 12.0  Hematocrit 36 - 46 % 33.6(L)  Platelets 150 - 400 K/uL 245      Assessment and plan- Patient is a 69 y.o. female female with invasive mammary carcinoma of the left breast pathological prognostic stage I [id number] ER PR positive HER-2/neu negative status post lumpectomy and sentinel lymph node biopsy.She is high risk mammprint score and s/p mastectomygiven concerns for residual disease.She is s/p 4 cycles of adjuvant TC chemotherapyand adjuvant radiation treatment.  She is here for routine follow-up of breast cancer and to receive Zometa  Clinically patient is doing well with no concerning signs and symptoms no recurrence .  She had her right breast mammogram on 04/29/2020 which was normal.  She will continue to take Arimidex along with calcium and vitamin D for 10 years.  Patient is receiving adjuvant bisphosphonates every 6 months and will receive her next dose of Zometa today.  I will see her back in 6 months prior  to next cycle of Zometa and she will get labs CBC with differential and CMP on that day   Visit Diagnosis 1. Encounter for follow-up surveillance of breast cancer   2. Visit for monitoring Arimidex therapy   3. Long term (current) use of bisphosphonates      Dr. Randa Evens, MD, MPH [facility] at [facility] [phone] 07/25/2020 9:20 AM

## 2020-08-12 IMAGING — US ULTRASOUND LEFT BREAST LIMITED
1 series · 13 of 17 positions shown · non-contrast
Comparison: Previous exam(s).

CLINICAL DATA: Recall from screening to evaluate 2 possible masses.

EXAM:
DIGITAL DIAGNOSTIC left MAMMOGRAM WITH TOMO
ULTRASOUND left BREAST

[Series 1: ultrasound left breast limited · 0.05mm/px · 13 of 17 slices shown]
[im 1/17]
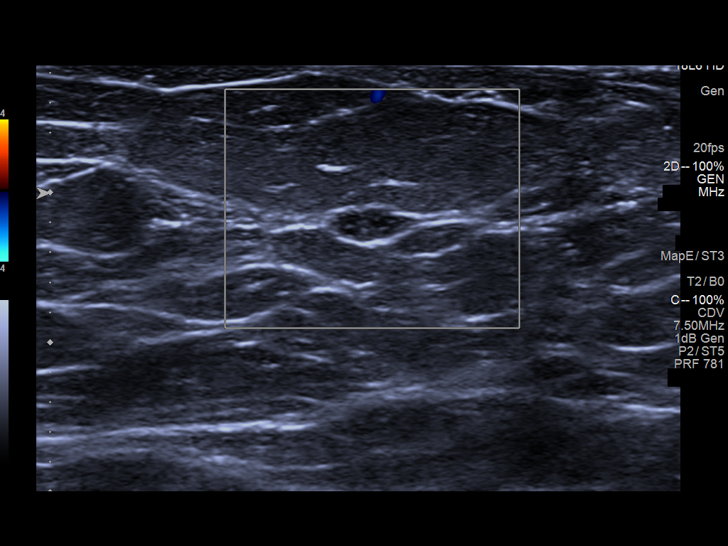
[im 2/17]
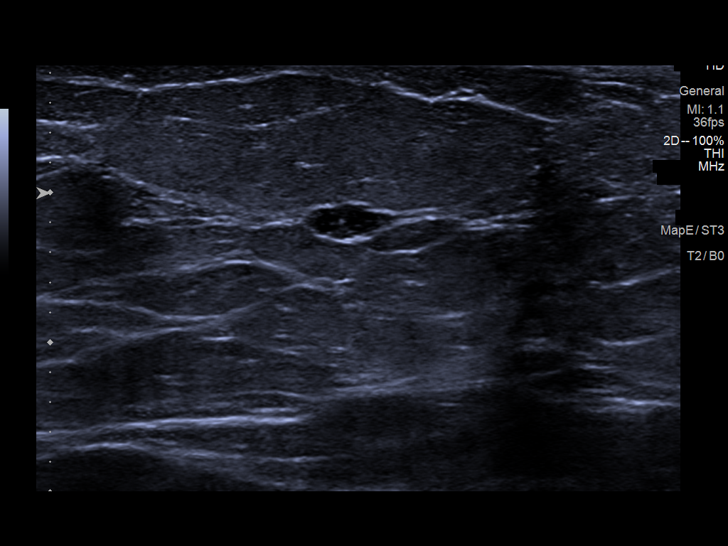
[im 4/17]
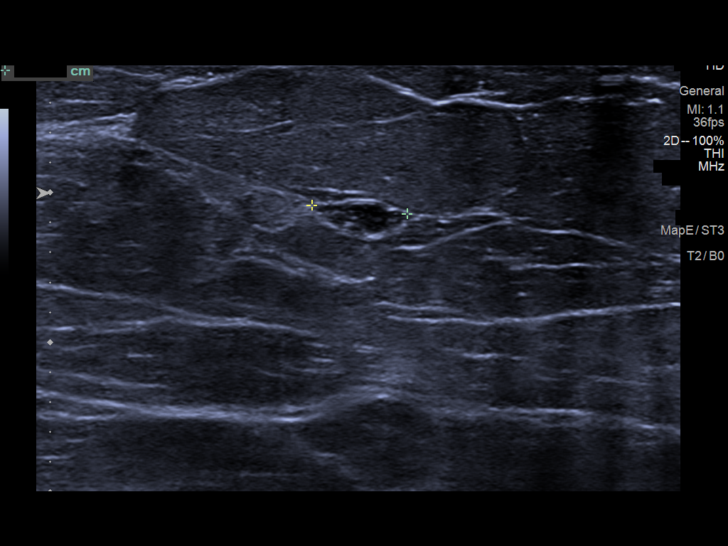
[im 5/17]
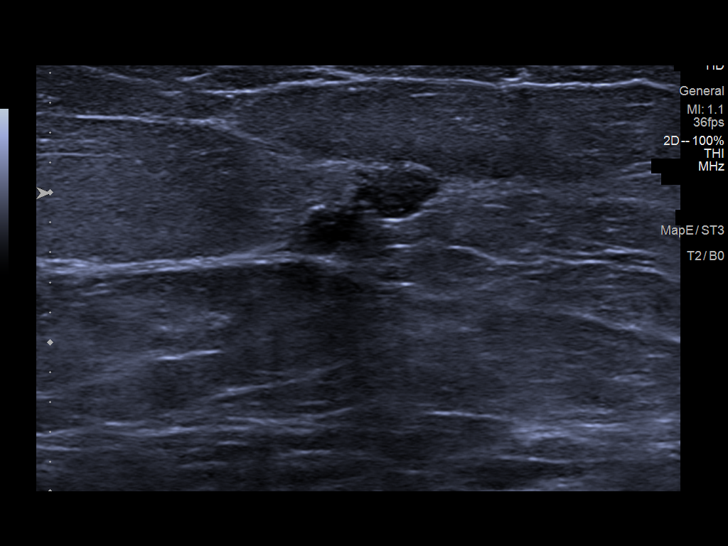
[im 6/17]
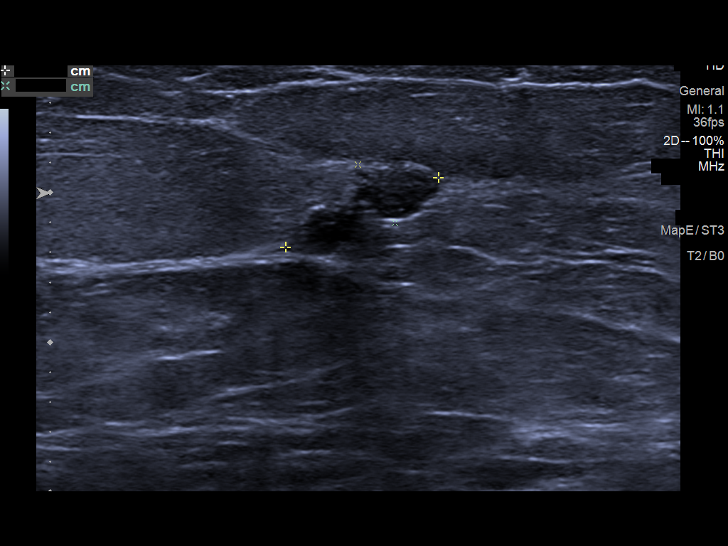
[im 8/17]
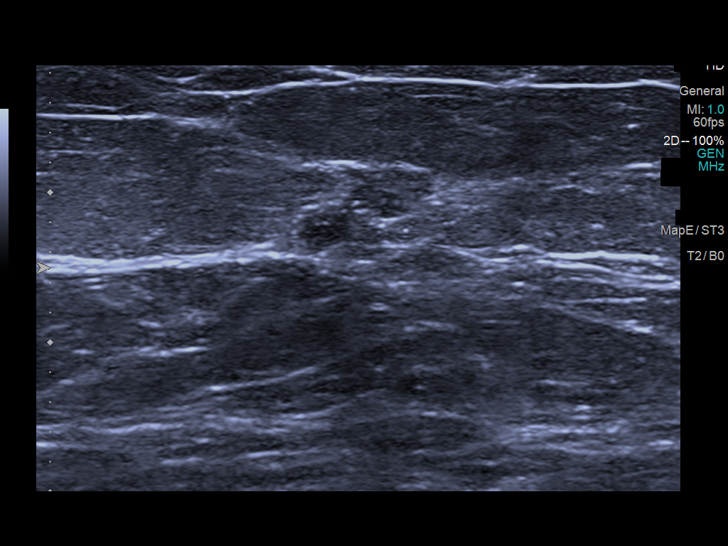
[im 9/17]
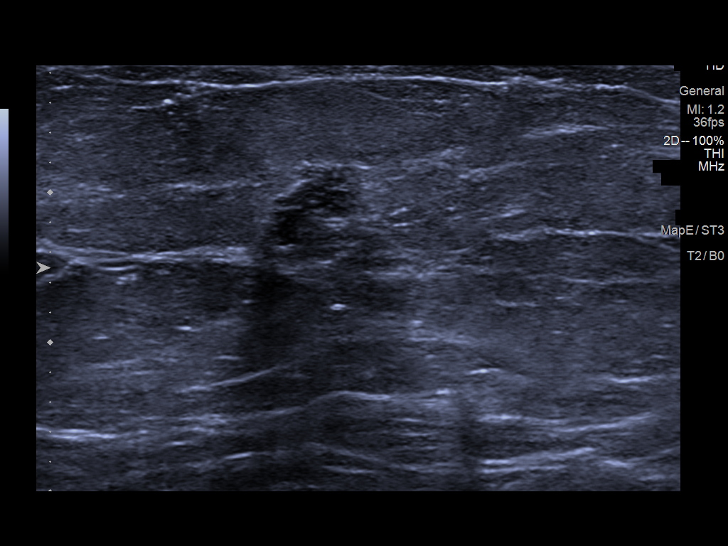
[im 10/17]
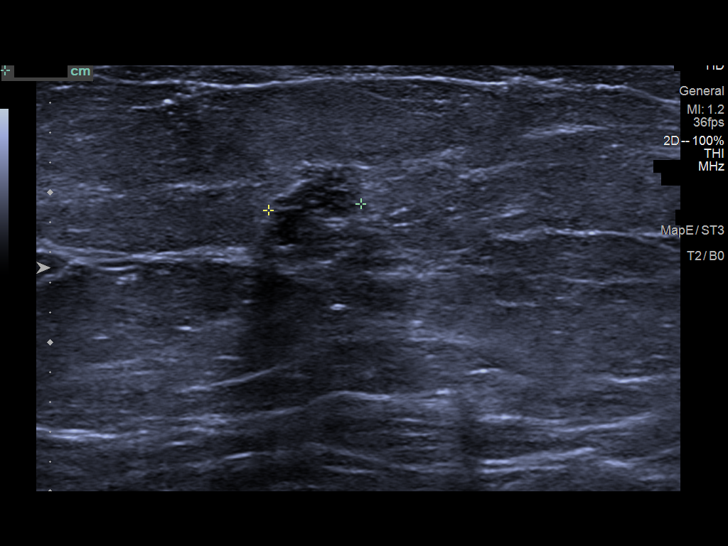
[im 12/17]
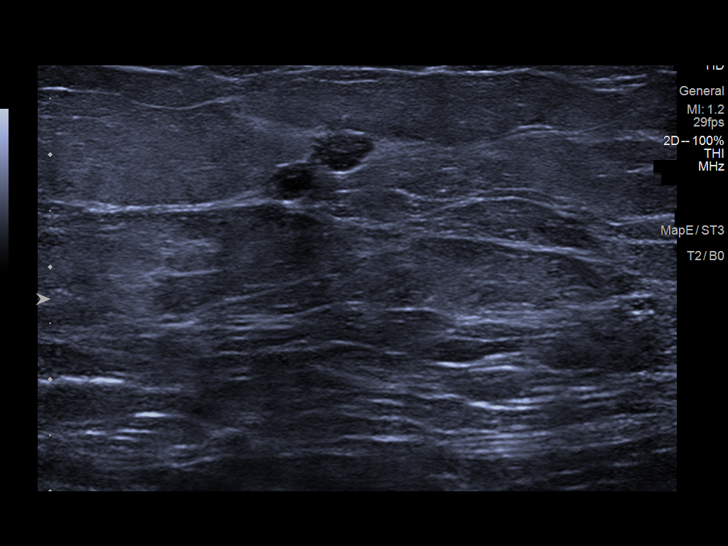
[im 13/17]
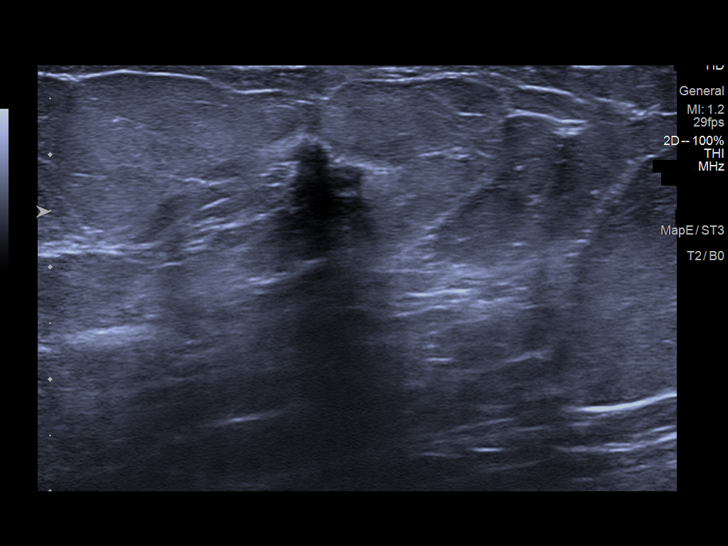
[im 14/17]
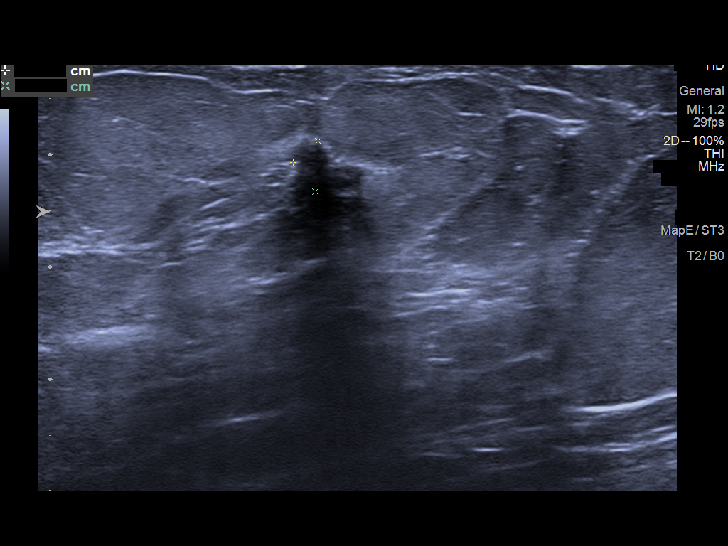
[im 16/17]
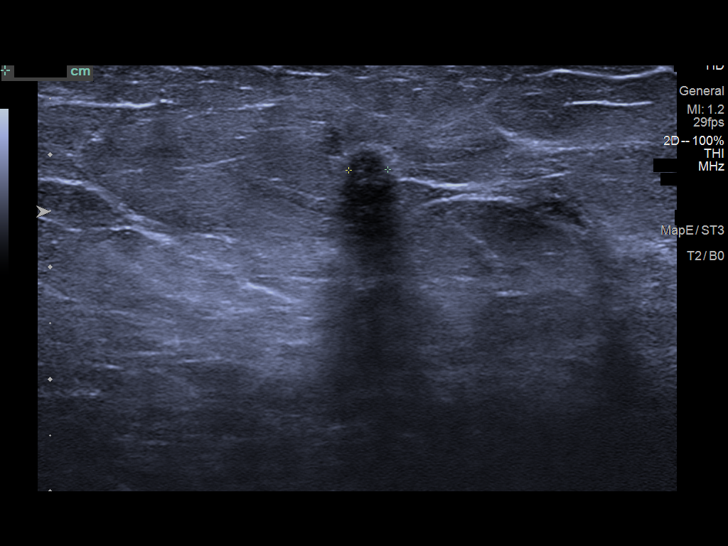
[im 17/17]
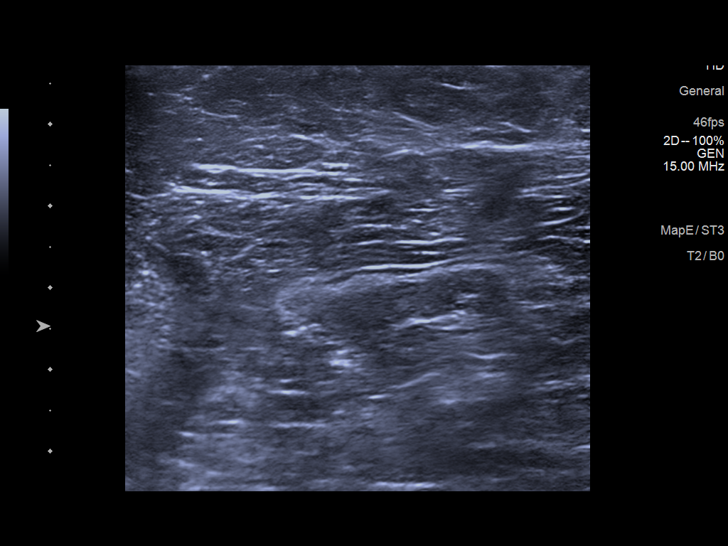

[13 of 17 positions shown; findings below may reference images not displayed]

ACR Breast Density Category b: There are scattered areas of
fibroglandular density.
FINDINGS: Additional spot compression images were obtained. There are several
small subcentimeter round/oval nodular densities waxing and waning
over the outer mid to upper left breast compared to prior exams.
Today's images demonstrate an oval partially circumscribed but
partially irregular 9 mm nodule over the outer midportion of the
left breast in the middle third. There is also a 4 mm oval partially
circumscribed mass over the upper outer left breast in the middle
third. Remainder the exam is unchanged.

Targeted ultrasound is performed, showing a bilobed hypoechoic mass
with minimal through transmission at the [DATE] position of the left
breast 6 cm from the nipple measuring 0.5 x 0.6 x 1.1 cm which may
correspond to the larger mammographic finding described. There is an
oval circumscribed hypoechoic mass with mild through transmission at
the 2 o'clock position 4 cm from the nipple which mammary not
correspond to the second mammographic abnormality.

There is also a somewhat bilobed slightly irregular hypoechoic mass
with shadowing at the 3 o'clock position of the left breast 3 cm
from the nipple measuring 4 x 5 x 6 mm. This may or may not
correspond to 1 of the mammographic findings.

Ultrasound of the left axilla is normal.
IMPRESSION: Two probable benign masses at the 2 o'clock and [DATE] position of the
left breast as described. More indeterminate appearing 6 mm mass at
the 3 o'clock position of the left breast 3 cm from the nipple.

RECOMMENDATION:
Recommend ultrasound-guided core needle biopsy of the indeterminate
mass at the 3 o'clock position. If benign, would recommend six-month
follow-up diagnostic left breast mammogram and ultrasound of the
other 2 probable benign masses. If malignant, patient may require
biopsies of the other masses.

I have discussed the findings and recommendations with the patient.
Results were also provided in writing at the conclusion of the
visit. If applicable, a reminder letter will be sent to the patient
regarding the next appointment.

BI-RADS CATEGORY  4: Suspicious.

Biopsy will be scheduled prior to patient's departure from the
[REDACTED].

## 2020-09-07 ENCOUNTER — Other Ambulatory Visit: Payer: Self-pay | Admitting: Oncology

## 2020-09-15 IMAGING — MG MM PLC BREAST LOC DEV 1ST LESION INC MAMMO GUIDE*L*
7 series · 7 of 7 positions shown · non-contrast
Comparison: Previous exams.

CLINICAL DATA: Bracketed needle localization

EXAM:
NEEDLE LOCALIZATION OF THE LEFT BREAST WITH MAMMO GUIDANCE

[L CC (1 of 3)]
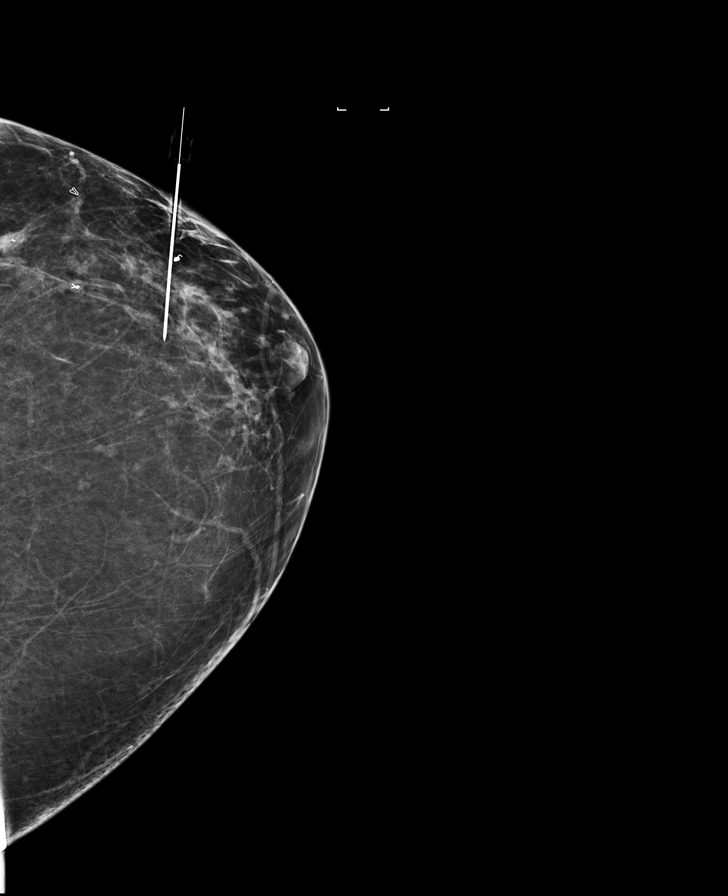

[L LM (1 of 2)]
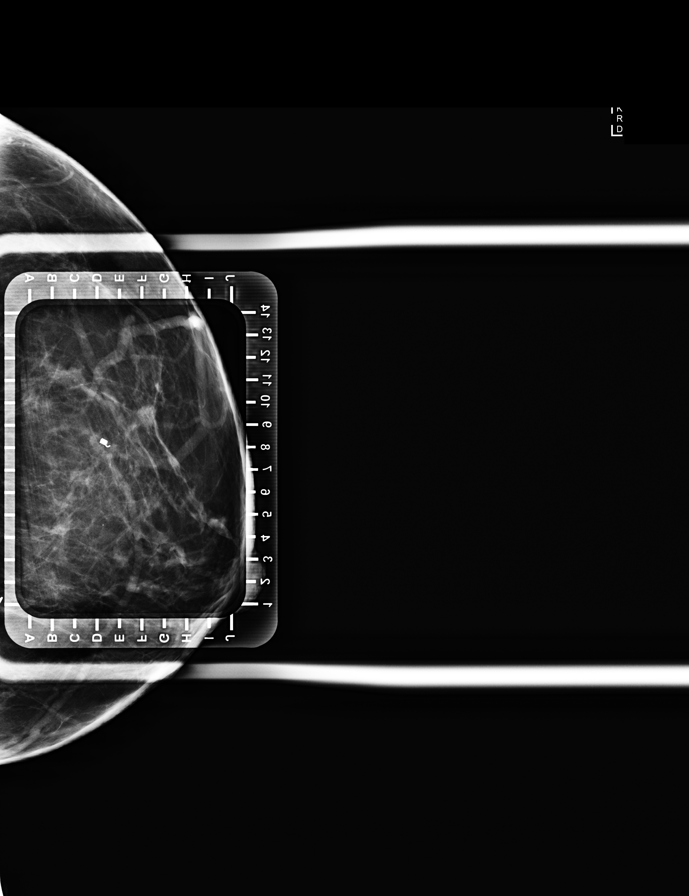

[L SPECIMEN (1 of 2)]
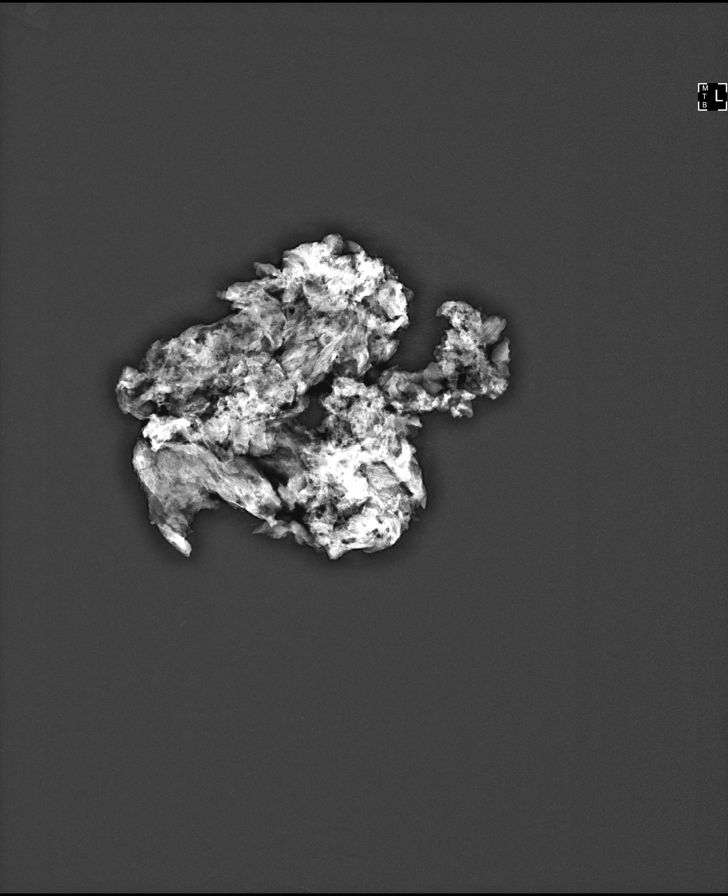

[L LM (2 of 2)]
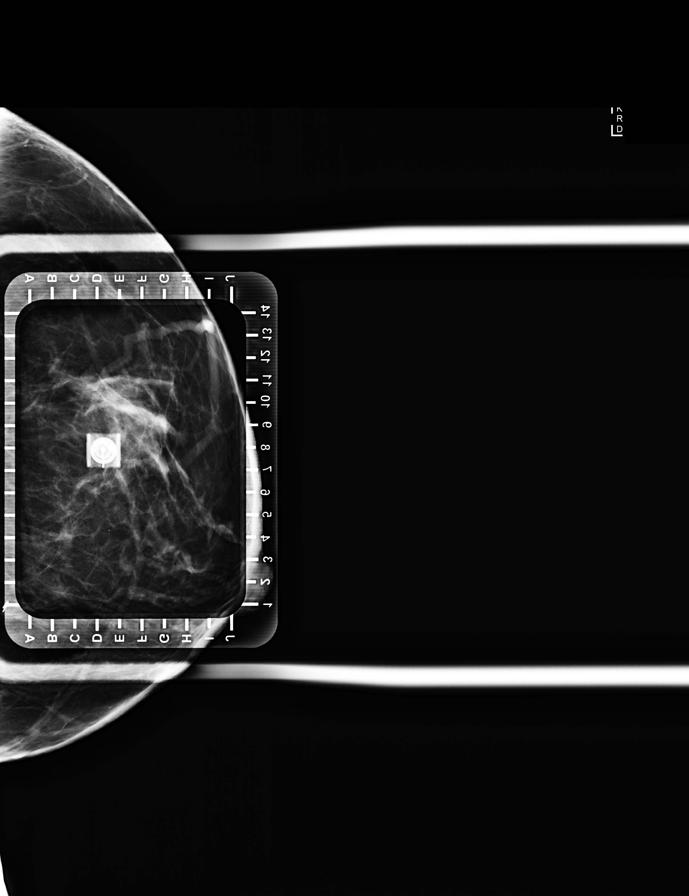

[L SPECIMEN (2 of 2)]
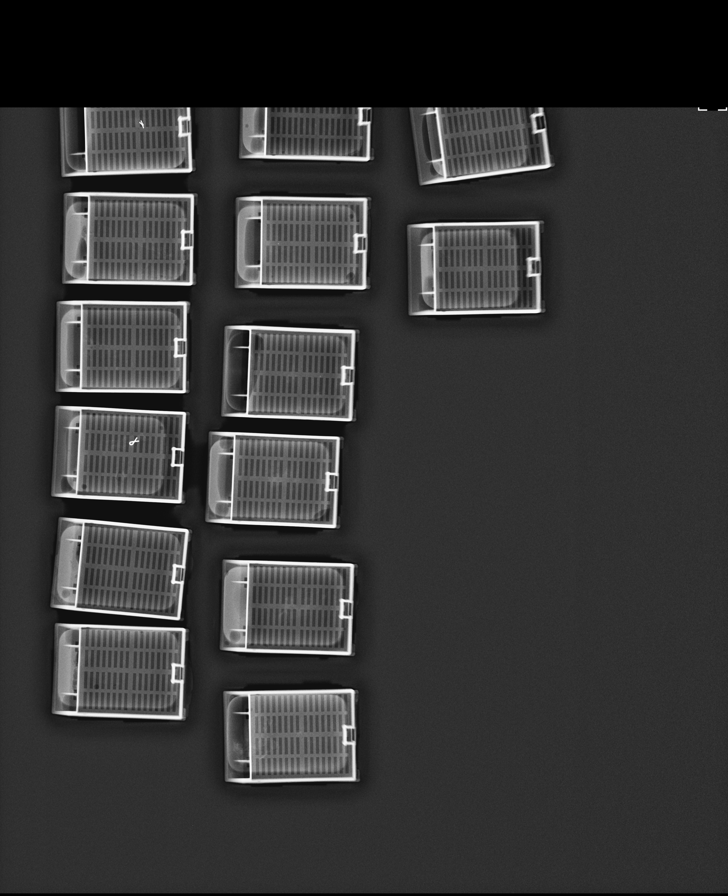

[L CC (2 of 3)]
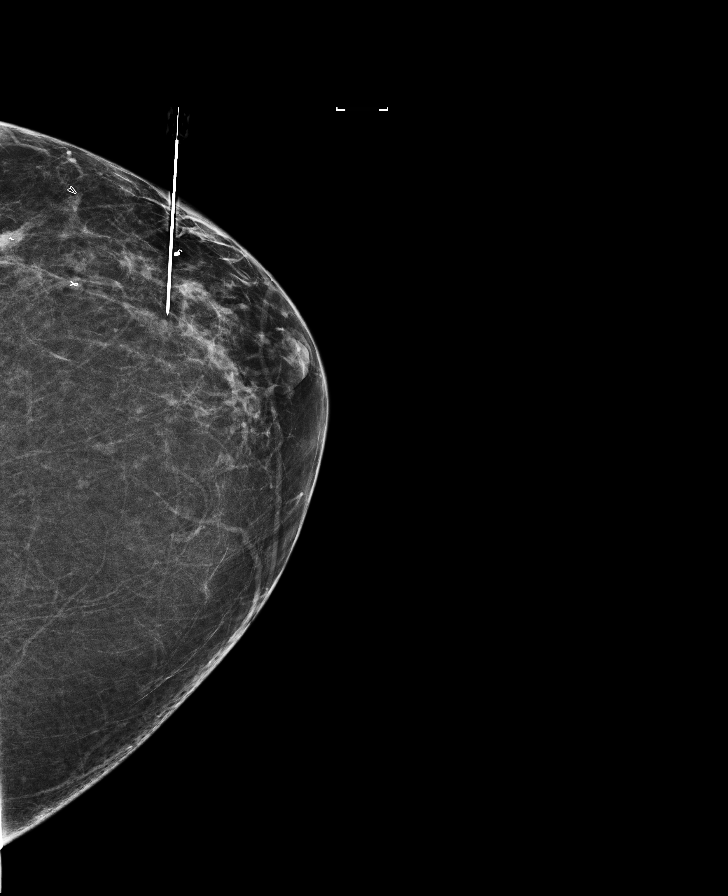

[L CC (3 of 3)]
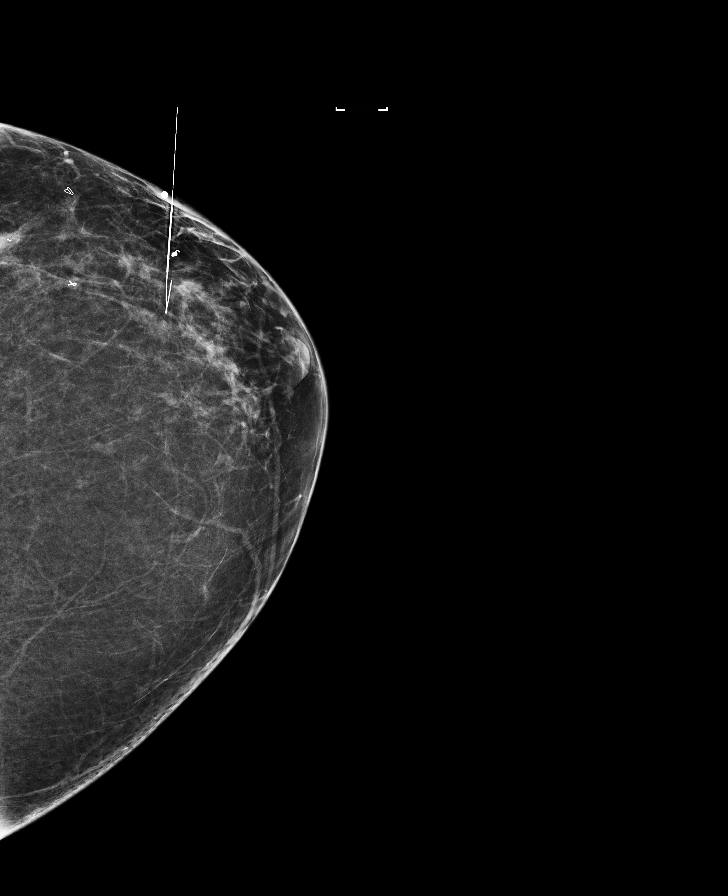

[7 of 7 positions shown; findings below may reference images not displayed]

FINDINGS: Patient presents for needle localization prior to surgery. I met
with the patient and we discussed the procedure of needle
localization including benefits and alternatives. We discussed the
high likelihood of a successful procedure. We discussed the risks of
the procedure, including infection, bleeding, tissue injury, and
further surgery. Informed, written consent was given. The usual
time-out protocol was performed immediately prior to the procedure.

Using mammographic guidance, sterile technique, 1% lidocaine and a 5
cm modified Kopans needle, the anterior coil shaped clip was
localized using lateral approach. Using mammographic guidance,
sterile technique, and 1% lidocaine, a 7 cm modified Prospero needle
was used to localize the posterior X shaped clip using a lateral
approach.

Of note, the heart and ribbon shaped clips are located superior and
inferior to the localizing wires respectively. The heart shaped clip
is located 1.95 cm superior and 2.6 cm posterior to the anterior
wire. The ribbon shaped clip is located 2.5 cm inferior and 1.3 cm
anterior to the posterior wire.

The images were marked for the surgeon.
IMPRESSION: Needle localization left breast. No apparent complications.

## 2020-10-29 DIAGNOSIS — N904 Leukoplakia of vulva: Secondary | ICD-10-CM | POA: Insufficient documentation

## 2020-11-29 ENCOUNTER — Other Ambulatory Visit: Payer: Self-pay | Admitting: Oncology

## 2021-01-09 ENCOUNTER — Other Ambulatory Visit: Payer: Self-pay | Admitting: Obstetrics & Gynecology

## 2021-01-09 ENCOUNTER — Other Ambulatory Visit: Payer: Self-pay | Admitting: Internal Medicine

## 2021-01-09 DIAGNOSIS — Z1231 Encounter for screening mammogram for malignant neoplasm of breast: Secondary | ICD-10-CM

## 2021-01-20 ENCOUNTER — Ambulatory Visit
Admission: RE | Admit: 2021-01-20 | Discharge: 2021-01-20 | Disposition: A | Discharge status: Home / Self Care | Payer: Medicare Other | Source: Ambulatory Visit | Attending: Radiation Oncology | Admitting: Radiation Oncology

## 2021-01-20 ENCOUNTER — Encounter: Payer: Self-pay | Admitting: Radiation Oncology

## 2021-01-20 ENCOUNTER — Other Ambulatory Visit: Payer: Self-pay

## 2021-01-20 VITALS — BP 139/77 | HR 77 | Temp 97.0°F | Wt 212.0 lb

## 2021-01-20 DIAGNOSIS — Z79811 Long term (current) use of aromatase inhibitors: Secondary | ICD-10-CM | POA: Insufficient documentation

## 2021-01-20 DIAGNOSIS — C50412 Malignant neoplasm of upper-outer quadrant of left female breast: Secondary | ICD-10-CM | POA: Diagnosis present

## 2021-01-20 DIAGNOSIS — Z923 Personal history of irradiation: Secondary | ICD-10-CM | POA: Insufficient documentation

## 2021-01-20 DIAGNOSIS — Z170 Estrogen receptor positive status [ER+]: Secondary | ICD-10-CM | POA: Diagnosis not present

## 2021-01-20 NOTE — Progress Notes (Signed)
Radiation Oncology Follow up Note  Name: Amanda Wade   Date:   01/20/2021 MRN:  [id number] DOB: December 28, 1950    This 70 y.o. 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  REFERRING PROVIDER: Ezequiel Kayser, MD  HPI: Patient is a 70 year old female now at 1 year having completed radiation therapy to her left chest wall and peripheral lymphatics for stage T1BN1A ER/PR positive invasive mammary carcinoma.  Seen today in routine follow-up she is doing well.  She specifically denies any nodularity or irritation of her mastectomy scar.  She is having no swelling in her left upper extremity.  And specifically denies cough or bone pain..  Patient is currently on Arimidex tolerant well without side effect.  She had a mammogram of her right breast which I have reviewed which was BI-RADS 1 negative.  COMPLICATIONS OF TREATMENT: none  FOLLOW UP COMPLIANCE: keeps appointments   PHYSICAL EXAM:  BP 139/77   Pulse 77   Temp (!) 97 F (36.1 C) (Tympanic)   Wt 212 lb (96.2 kg)   BMI 40.06 kg/m  Patient status post left modified radical mastectomy chest wall is clear without evidence of mass or nodularity right breast is free of dominant mass.  No axillary or supraclavicular adenopathy is appreciated no evidence of lymphedema in the left upper extremity is noted.  Well-developed well-nourished patient in NAD. HEENT reveals PERLA, EOMI, discs not visualized.  Oral cavity is clear. No oral mucosal lesions are identified. Neck is clear without evidence of cervical or supraclavicular adenopathy. Lungs are clear to A&P. Cardiac examination is essentially unremarkable with regular rate and rhythm without murmur rub or thrill. Abdomen is benign with no organomegaly or masses noted. Motor sensory and DTR levels are equal and symmetric in the upper and lower  extremities. Cranial nerves II through XII are grossly intact. Proprioception is intact. No peripheral adenopathy or edema is identified. No motor or sensory levels are noted. Crude visual fields are within normal range.  RADIOLOGY RESULTS: Right-sided mammogram reviewed compatible with above-stated findings  PLAN: Present time patient continues to do well 1 year out from chest wall and peripheral lymphatic radiation.  I am pleased with her overall progress.  She has no evidence of disease.  She continues on Arimidex without side effect.  She is scheduled for another mammogram in June.  Patient knows to call with any concerns.  I would like to take this opportunity to thank you for allowing me to participate in the care of your patient.[name], MD

## 2021-01-24 ENCOUNTER — Ambulatory Visit: Payer: Medicare Other

## 2021-01-24 ENCOUNTER — Ambulatory Visit: Payer: Medicare Other | Admitting: Oncology

## 2021-01-24 ENCOUNTER — Other Ambulatory Visit: Payer: Medicare Other

## 2021-02-03 ENCOUNTER — Other Ambulatory Visit: Payer: Self-pay | Admitting: *Deleted

## 2021-02-03 DIAGNOSIS — Z170 Estrogen receptor positive status [ER+]: Secondary | ICD-10-CM

## 2021-02-04 ENCOUNTER — Inpatient Hospital Stay (HOSPITAL_BASED_OUTPATIENT_CLINIC_OR_DEPARTMENT_OTHER): Payer: Medicare Other | Admitting: Oncology

## 2021-02-04 ENCOUNTER — Inpatient Hospital Stay: Payer: Medicare Other | Attending: Oncology

## 2021-02-04 ENCOUNTER — Encounter: Payer: Self-pay | Admitting: Oncology

## 2021-02-04 ENCOUNTER — Inpatient Hospital Stay: Payer: Medicare Other

## 2021-02-04 VITALS — BP 120/80 | HR 77 | Temp 98.7°F | Resp 16 | Wt 213.0 lb

## 2021-02-04 DIAGNOSIS — Z7984 Long term (current) use of oral hypoglycemic drugs: Secondary | ICD-10-CM | POA: Diagnosis not present

## 2021-02-04 DIAGNOSIS — Z8249 Family history of ischemic heart disease and other diseases of the circulatory system: Secondary | ICD-10-CM | POA: Insufficient documentation

## 2021-02-04 DIAGNOSIS — Z170 Estrogen receptor positive status [ER+]: Secondary | ICD-10-CM

## 2021-02-04 DIAGNOSIS — Z90710 Acquired absence of both cervix and uterus: Secondary | ICD-10-CM | POA: Insufficient documentation

## 2021-02-04 DIAGNOSIS — Z9079 Acquired absence of other genital organ(s): Secondary | ICD-10-CM | POA: Insufficient documentation

## 2021-02-04 DIAGNOSIS — C50412 Malignant neoplasm of upper-outer quadrant of left female breast: Secondary | ICD-10-CM | POA: Insufficient documentation

## 2021-02-04 DIAGNOSIS — Z5181 Encounter for therapeutic drug level monitoring: Secondary | ICD-10-CM | POA: Diagnosis not present

## 2021-02-04 DIAGNOSIS — Z803 Family history of malignant neoplasm of breast: Secondary | ICD-10-CM | POA: Insufficient documentation

## 2021-02-04 DIAGNOSIS — I10 Essential (primary) hypertension: Secondary | ICD-10-CM | POA: Insufficient documentation

## 2021-02-04 DIAGNOSIS — Z79811 Long term (current) use of aromatase inhibitors: Secondary | ICD-10-CM

## 2021-02-04 DIAGNOSIS — Z7983 Long term (current) use of bisphosphonates: Secondary | ICD-10-CM

## 2021-02-04 DIAGNOSIS — Z90722 Acquired absence of ovaries, bilateral: Secondary | ICD-10-CM | POA: Diagnosis not present

## 2021-02-04 DIAGNOSIS — Z853 Personal history of malignant neoplasm of breast: Secondary | ICD-10-CM | POA: Diagnosis not present

## 2021-02-04 DIAGNOSIS — Z7982 Long term (current) use of aspirin: Secondary | ICD-10-CM | POA: Insufficient documentation

## 2021-02-04 DIAGNOSIS — Z08 Encounter for follow-up examination after completed treatment for malignant neoplasm: Secondary | ICD-10-CM | POA: Diagnosis not present

## 2021-02-04 DIAGNOSIS — Z923 Personal history of irradiation: Secondary | ICD-10-CM | POA: Diagnosis not present

## 2021-02-04 DIAGNOSIS — Z8042 Family history of malignant neoplasm of prostate: Secondary | ICD-10-CM | POA: Insufficient documentation

## 2021-02-04 DIAGNOSIS — Z79899 Other long term (current) drug therapy: Secondary | ICD-10-CM | POA: Insufficient documentation

## 2021-02-04 DIAGNOSIS — Z833 Family history of diabetes mellitus: Secondary | ICD-10-CM | POA: Insufficient documentation

## 2021-02-04 LAB — CBC WITH DIFFERENTIAL/PLATELET
Abs Immature Granulocytes: 0.06 10*3/uL (ref 0.00–0.07)
Basophils Absolute: 0.1 10*3/uL (ref 0.0–0.1)
Basophils Relative: 1 %
Eosinophils Absolute: 0.2 10*3/uL (ref 0.0–0.5)
Eosinophils Relative: 3 %
HCT: 35.3 % — ABNORMAL LOW (ref 36.0–46.0)
Hemoglobin: 12.5 g/dL (ref 12.0–15.0)
Immature Granulocytes: 1 %
Lymphocytes Relative: 20 %
Lymphs Abs: 1.4 10*3/uL (ref 0.7–4.0)
MCH: 30.6 pg (ref 26.0–34.0)
MCHC: 35.4 g/dL (ref 30.0–36.0)
MCV: 86.3 fL (ref 80.0–100.0)
Monocytes Absolute: 0.4 10*3/uL (ref 0.1–1.0)
Monocytes Relative: 6 %
Neutro Abs: 5 10*3/uL (ref 1.7–7.7)
Neutrophils Relative %: 69 %
Platelets: 282 10*3/uL (ref 150–400)
RBC: 4.09 MIL/uL (ref 3.87–5.11)
RDW: 13.2 % (ref 11.5–15.5)
WBC: 7.1 10*3/uL (ref 4.0–10.5)
nRBC: 0 % (ref 0.0–0.2)

## 2021-02-04 LAB — COMPREHENSIVE METABOLIC PANEL
ALT: 43 U/L (ref 0–44)
AST: 49 U/L — ABNORMAL HIGH (ref 15–41)
Albumin: 3.8 g/dL (ref 3.5–5.0)
Alkaline Phosphatase: 76 U/L (ref 38–126)
Anion gap: 13 (ref 5–15)
BUN: 14 mg/dL (ref 8–23)
CO2: 25 mmol/L (ref 22–32)
Calcium: 9.8 mg/dL (ref 8.9–10.3)
Chloride: 99 mmol/L (ref 98–111)
Creatinine, Ser: 0.87 mg/dL (ref 0.44–1.00)
GFR, Estimated: >60 mL/min (ref >60)
Glucose, Bld: 182 mg/dL — ABNORMAL HIGH (ref 70–99)
Potassium: 4.2 mmol/L (ref 3.5–5.1)
Sodium: 137 mmol/L (ref 135–145)
Total Bilirubin: 0.7 mg/dL (ref 0.3–1.2)
Total Protein: 7 g/dL (ref 6.5–8.1)

## 2021-02-04 MED ORDER — ZOLEDRONIC ACID 4 MG/100ML IV SOLN
4.0000 mg | INTRAVENOUS | Status: DC
  Administered 2021-02-04: 4 mg via INTRAVENOUS
  Filled 2021-02-04: qty 100

## 2021-02-04 MED ORDER — SODIUM CHLORIDE 0.9 % IV SOLN
Freq: Once | INTRAVENOUS | Status: AC
  Filled 2021-02-04: qty 250

## 2021-02-04 NOTE — Progress Notes (Signed)
Pt doing well, no c/o.

## 2021-02-05 ENCOUNTER — Encounter: Payer: Self-pay | Admitting: Oncology

## 2021-02-05 NOTE — Progress Notes (Signed)
Hematology/Oncology Consult note [facility]  Telephone:([phone] [phone]  Patient Care Team: Ezequiel Kayser, MD as PCP - General (Internal Medicine) Sindy Guadeloupe, MD as Consulting Physician (Oncology) Bary Castilla, Forest Gleason, MD as Consulting Physician (General Surgery) Rico Junker, RN as Oncology Nurse Navigator [name], MD as Referring Physician (Radiation Oncology)   Name of the patient: Amanda Wade  [id number]  16-Dec-1950   Date of visit: 02/05/21  Diagnosis- pathological prognostic stage IA mpT1b mpN1aInvasive mammary carcinoma of the left breast status post lumpectomy and sentinel lymph node biopsy  Chief complaint/ Reason for visit-routine follow-up of breast cancer and to receive Zometa  Heme/Onc history: Patient is a 70 year old postmenopausal female with a past medical history significant for hypertension hyperlipidemia among other medical problems. She recently underwent a screening mammogram on 04/28/2019 which showed possible mass in the left breast. This was followed by diagnostic mammogram and ultrasound which showed bilobar hypo-Echoic mass at the 2:30 position of the left breast measuring 0.5 x 0.6 x 1.1 cm. Hypoechoic mass at the 2 o'clock position as well. And another bilobed irregular hypoechoic mass at the 3 o'clock position measuring 4 x 5 x 6 mm. Patient had biopsy of all these 3 masses. The 3 o'clock position breast mass came back as invasive mammary carcinoma 6 mm, grade 1 strongly ER PR positive greater than 90% and HER-2/neu negative. 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 230 breast mass also came back as intermediate grade DCIS. There was also another mass noted in the left breast which was biopsied and was 5 mm grade 1 ER PR positive and HER-2/neu negative.  Patient was taken for lumpectomy and sentinel lymph node biopsy by Dr. Bary Castilla on 06/05/2019.Final pathology showed invasive  mammary carcinoma grade 1 ER PR positive and HER-2/neu negative at 4 different biopsy sites with extensive DCIS predominantly micropapillary type. One lymph node was also involved with macro metastatic carcinoma and was negative for extranodal extension  Patient's case was discussed at tumor board and there was a concern that there may be potentially more areas of abnormality given extensive DCIS and invasive cancer seen in multiple foci in the lumpectomy specimen. Plan was to proceed with bilateral MRI.Breast MRI showed a large area of clumped discontinued non-mass enhancement in the left upper breast spanning an area of 9.8 cm in AP dimension suspicious for residual DCIS or invasive carcinoma.Patient therefore proceeded with r left mastectomy which showed small area of residual DCIS but no evidence of invasive malignancy.  Patient also had a MammaPrint testing done on her lumpectomy specimen which came back as high risk group. 29% chance of recurrence without chemotherapy at 5 years. Greater than 12% chemo benefit and estimated >93% survival at 5 years with chemotherapy and hormone therapy  Patient completed 4 cycles of adjuvant TC chemotherapy on 10/26/2019. Shecompletedadjuvant radiation treatment.Patient started taking Arimidex in December 2020   Interval history-patient reports doing well.  Appetite and weight have remained stable.  Denies any new aches and pains anywhere.  ECOG PS- 1 Pain scale- 0   Review of systems- Review of Systems  Constitutional: Negative for chills, fever, malaise/fatigue and weight loss.  HENT: Negative for congestion, ear discharge and nosebleeds.   Eyes: Negative for blurred vision.  Respiratory: Negative for cough, hemoptysis, sputum production, shortness of breath and wheezing.   Cardiovascular: Negative for chest pain, palpitations, orthopnea and claudication.  Gastrointestinal: Negative for abdominal pain, blood in stool, constipation,  diarrhea,  heartburn, melena, nausea and vomiting.  Genitourinary: Negative for dysuria, flank pain, frequency, hematuria and urgency.  Musculoskeletal: Negative for back pain, joint pain and myalgias.  Skin: Negative for rash.  Neurological: Negative for dizziness, tingling, focal weakness, seizures, weakness and headaches.  Endo/Heme/Allergies: Does not bruise/bleed easily.  Psychiatric/Behavioral: Negative for depression and suicidal ideas. The patient does not have insomnia.      Allergies  Allergen Reactions  . Shellfish Allergy Rash  . Sulfa Antibiotics Rash     Past Medical History:  Diagnosis Date  . Breast cancer ([place])    left breast  . Complication of anesthesia   . Headache   . Hyperlipidemia   . Hypertension   . Malignant neoplasm of upper-outer quadrant of left breast in female, estrogen receptor positive ([place]) 05/19/2019  . Personal history of chemotherapy   . Personal history of radiation therapy   . PONV (postoperative nausea and vomiting) 1987  . Pre-diabetes   . Trigger finger of right hand    and thumb  . Vertigo    intermittent especially lying on left side     Past Surgical History:  Procedure Laterality Date  . ABDOMINAL HYSTERECTOMY  2004   complete  . BREAST BIOPSY Left 05/05/2019   [facility]- ribbon clip  . BREAST BIOPSY Left 05/16/2019   [facility]-DCIS  . BREAST BIOPSY Left 05/16/2019   [facility]- X clip  . BREAST BIOPSY WITH SENTINEL LYMPH NODE BIOPSY AND NEEDLE LOCALIZATION Left 06/05/2019   Procedure: BREAST BIOPSY WITH SENTINEL LYMPH NODE BIOPSY AND NEEDLE LOCALIZATION, LEFT, BRACKETING WIRE;  Surgeon: Robert Bellow, MD;  Location: [facility] ORS;  Service: General;  Laterality: Left;  . BREAST CYST ASPIRATION Bilateral age 65   neg  . BREAST LUMPECTOMY Left 05/2019   positive  . CHOLECYSTECTOMY    . COLONOSCOPY WITH PROPOFOL N/A 08/02/2018   Procedure: COLONOSCOPY WITH PROPOFOL;  Surgeon: [name], MD;  Location: [facility] ENDOSCOPY;  Service:  Endoscopy;  Laterality: N/A;  . DILATION AND CURETTAGE OF UTERUS    . goiter surgery    . MASTECTOMY Left 07/10/2019  . OOPHORECTOMY    . PORTACATH PLACEMENT Left 07/10/2019   Procedure: INSERTION PORT-A-CATH;  Surgeon: Jules Husbands, MD;  Location: [facility] ORS;  Service: General;  Laterality: Left;  . SIMPLE MASTECTOMY WITH AXILLARY SENTINEL NODE BIOPSY Left 07/10/2019   Procedure: SIMPLE MASTECTOMY.; LEFT;  Surgeon: Jules Husbands, MD;  Location: [facility] ORS;  Service: General;  Laterality: Left;  . THYROIDECTOMY, PARTIAL    . TRIGGER FINGER RELEASE     rigt hand thumb and pointer finger    Social History   Socioeconomic History  . Marital status: Divorced    Spouse name: Not on file  . Number of children: Not on file  . Years of education: Not on file  . Highest education level: Not on file  Occupational History  . Not on file  Tobacco Use  . Smoking status: Never Smoker  . Smokeless tobacco: Never Used  Vaping Use  . Vaping Use: Never used  Substance and Sexual Activity  . Alcohol use: Not Currently  . Drug use: No  . Sexual activity: Not Currently  Other Topics Concern  . Not on file  Social History Narrative  . Not on file   Social Determinants of Health   Financial Resource Strain: Not on file  Food Insecurity: Not on file  Transportation Needs: Not on file  Physical Activity: Not on file  Stress: Not on  file  Social Connections: Not on file  Intimate Partner Violence: Not on file    Family History  Problem Relation Age of Onset  . Breast cancer Cousin        mat cousin  . Hypertension Mother   . Heart attack Father   . Prostate cancer Father   . Diabetes Father   . Colon polyps Father      Current Outpatient Medications:  .  acetaminophen (TYLENOL) 500 MG tablet, Take 500 mg by mouth every 6 (six) hours as needed (for pain/headaches.)., Disp: , Rfl:  .  anastrozole (ARIMIDEX) 1 MG tablet, TAKE 1 TABLET BY MOUTH EVERY DAY, Disp: 90 tablet, Rfl: 3 .   aspirin EC 81 MG tablet, Take 81 mg by mouth daily., Disp: , Rfl:  .  atorvastatin (LIPITOR) 10 MG tablet, Take 10 mg by mouth daily. , Disp: , Rfl:  .  Calcium Carbonate-Vitamin D 600-400 MG-UNIT tablet, Take 2 tablets by mouth daily. , Disp: , Rfl:  .  lisinopril-hydrochlorothiazide (PRINZIDE,ZESTORETIC) 20-12.5 MG tablet, Take 1 tablet by mouth daily., Disp: , Rfl:  .  metFORMIN (GLUCOPHAGE-XR) 500 MG 24 hr tablet, Take 1 tablet by mouth in the morning and at bedtime., Disp: , Rfl:  .  Multiple Vitamin (MULTIVITAMIN WITH MINERALS) TABS tablet, Take 1 tablet by mouth daily after breakfast. Centrum Silver, Disp: , Rfl:   Physical exam:  Vitals:   02/04/21 1002  BP: 120/80  Pulse: 77  Resp: 16  Temp: 98.7 F (37.1 C)  TempSrc: Tympanic  Weight: 213 lb (96.6 kg)   Physical Exam Constitutional:      General: She is not in acute distress. Cardiovascular:     Rate and Rhythm: Normal rate and regular rhythm.     Heart sounds: Normal heart sounds.  Pulmonary:     Effort: Pulmonary effort is normal.     Breath sounds: Normal breath sounds.  Abdominal:     General: Bowel sounds are normal.     Palpations: Abdomen is soft.  Skin:    General: Skin is warm and dry.  Neurological:     Mental Status: She is alert and oriented to person, place, and time.   Breast exam: Patient is s/p left mastectomy without reconstruction.  No evidence of chest wall recurrence.  No palpable bilateral axillary adenopathy.  No palpable masses in the right breast.  CMP Latest Ref Rng & Units 02/04/2021  Glucose 70 - 99 mg/dL 182(H)  BUN 8 - 23 mg/dL 14  Creatinine 0.44 - 1.00 mg/dL 0.87  Sodium 135 - 145 mmol/L 137  Potassium 3.5 - 5.1 mmol/L 4.2  Chloride 98 - 111 mmol/L 99  CO2 22 - 32 mmol/L 25  Calcium 8.9 - 10.3 mg/dL 9.8  Total Protein 6.5 - 8.1 g/dL 7.0  Total Bilirubin 0.3 - 1.2 mg/dL 0.7  Alkaline Phos 38 - 126 U/L 76  AST 15 - 41 U/L 49(H)  ALT 0 - 44 U/L 43   CBC Latest Ref Rng & Units  02/04/2021  WBC 4.0 - 10.5 K/uL 7.1  Hemoglobin 12.0 - 15.0 g/dL 12.5  Hematocrit 36.0 - 46.0 % 35.3(L)  Platelets 150 - 400 K/uL 282      Assessment and plan- Patient is a 70 y.o. female with invasive mammary carcinoma of the left breast pathological prognostic stage I [id number] ER PR positive HER-2/neu negative status post lumpectomy and sentinel lymph node biopsy.She is high risk mammprint score and s/p mastectomygiven concerns for residual  disease.She is s/p 4 cycles of adjuvant TC chemotherapyand adjuvant radiation treatment. She is here for routine follow-up of breast cancer and to receive Zometa  Counts okay to proceed with Zometa today.  Clinically she is doing well with no concerning signs and symptoms of recurrence based on today's exam.  She will continue taking Arimidex along with calcium and vitamin D.She would be due for repeat bone density scan in December 2022.  I will see her back in 6 months for next dose of Zometa.  She will be getting a mammogram in June 2022 with GYN   Visit Diagnosis 1. Encounter for follow-up surveillance of breast cancer   2. Long term (current) use of bisphosphonates   3. High risk medication use   4. Visit for monitoring Arimidex therapy      Dr. Randa Evens, MD, MPH [facility] [phone] 02/05/2021 12:10 PM

## 2021-04-06 ENCOUNTER — Encounter: Payer: Self-pay | Admitting: Gynecology

## 2021-04-06 ENCOUNTER — Other Ambulatory Visit: Payer: Self-pay

## 2021-04-06 ENCOUNTER — Ambulatory Visit
Admission: EM | Admit: 2021-04-06 | Discharge: 2021-04-06 | Disposition: A | Discharge status: Home / Self Care | Payer: Medicare Other | Attending: Sports Medicine | Admitting: Sports Medicine

## 2021-04-06 DIAGNOSIS — W108XXA Fall (on) (from) other stairs and steps, initial encounter: Secondary | ICD-10-CM

## 2021-04-06 DIAGNOSIS — H9202 Otalgia, left ear: Secondary | ICD-10-CM

## 2021-04-06 DIAGNOSIS — S01312A Laceration without foreign body of left ear, initial encounter: Secondary | ICD-10-CM | POA: Diagnosis not present

## 2021-04-06 DIAGNOSIS — S0990XA Unspecified injury of head, initial encounter: Secondary | ICD-10-CM

## 2021-04-06 DIAGNOSIS — S0081XA Abrasion of other part of head, initial encounter: Secondary | ICD-10-CM

## 2021-04-06 NOTE — Discharge Instructions (Addendum)
As we discussed, he did have a laceration on the external aspect of your ear.  It is in a difficult spot so I have elected to use Dermabond and not suture it.  Please see educational handouts on the tissue adhesive Dermabond that I use. As we discussed, if you develop a headache, nausea, vomiting, or change in behavior a could be the sign of something going on inside of your head and brain and you need to call 911.  I have asked you to let a friend know that I want him to check in on you for the next 24 hours.  This is because you live alone. You can use Tylenol only if you needed.  I do not want you to use any Motrin, Advil, ibuprofen, Naprosyn, or Aleve.  You can continue with your 81 mg of aspirin.

## 2021-04-06 NOTE — ED Triage Notes (Signed)
Per patient while at church today she trip at stairs and fell. Per patient left side head hit  Wooden door. Patient with left ear abrasion. Pt. Deny any headache/ or  loss consciousness.

## 2021-04-09 DIAGNOSIS — S01312A Laceration without foreign body of left ear, initial encounter: Secondary | ICD-10-CM | POA: Diagnosis not present

## 2021-04-09 NOTE — ED Provider Notes (Signed)
[facility]    CSN: [id number] Arrival date & time: 04/06/21  1130      History   Chief Complaint No chief complaint on file.   HPI Amanda Wade is a 70 y.o. female.   Patient is a pleasant 70 year old female who presents for evaluation of the above issue.  She normally sees Dr. Dorthula Perfect at [facility] here in [facility] for ongoing medical care.  She reports that she tripped on the stairs going into church.  It was a mechanical fall.  No syncope or chest pain.  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  She also has a little bit of left-sided headache.  Has an abrasion over her temple.  No loss of consciousness.  No changes in behavior.  No nausea or vomiting.  She does have some mild low back pain but that is chronic.  She is not really concerned.  Not having any neurological issues.  No chest pain shortness of breath.  No red flag signs or symptoms elicited on history.     Past Medical History:  Diagnosis Date  . Breast cancer ([place])    left breast  . Complication of anesthesia   . Headache   . Hyperlipidemia   . Hypertension   . Malignant neoplasm of upper-outer quadrant of left breast in female, estrogen receptor positive ([place]) 05/19/2019  . Personal history of chemotherapy   . Personal history of radiation therapy   . PONV (postoperative nausea and vomiting) 1987  . Pre-diabetes   . Trigger finger of right hand    and thumb  . Vertigo    intermittent especially lying on left side    Patient Active Problem List   Diagnosis Date Noted  . Anemia due to antineoplastic chemotherapy 10/06/2019  . Low grade fever 08/08/2019  . Nausea without vomiting 08/08/2019  . Goals of care, counseling/discussion 05/21/2019  . Malignant neoplasm of upper-outer quadrant of left breast in female, estrogen receptor positive ([place]) 05/19/2019  . Primary osteoarthritis of right ankle 11/09/2017  . Chronic renal  insufficiency, stage 2 (mild) 04/08/2017  . Primary osteoarthritis of both knees 10/11/2015  . High risk medication use 04/08/2015  . Hyperlipidemia associated with type 2 diabetes mellitus ([place]) 10/01/2014  . Hypertension associated with type 2 diabetes mellitus ([place]) 10/01/2014  . Type 2 diabetes mellitus with stage 2 chronic kidney disease, without long-term current use of insulin ([place]) 10/01/2014  . History of adenomatous polyp of colon 08/15/2013    Past Surgical History:  Procedure Laterality Date  . ABDOMINAL HYSTERECTOMY  2004   complete  . BREAST BIOPSY Left 05/05/2019   [facility]- ribbon clip  . BREAST BIOPSY Left 05/16/2019   [facility]-DCIS  . BREAST BIOPSY Left 05/16/2019   [facility]- X clip  . BREAST BIOPSY WITH SENTINEL LYMPH NODE BIOPSY AND NEEDLE LOCALIZATION Left 06/05/2019   Procedure: BREAST BIOPSY WITH SENTINEL LYMPH NODE BIOPSY AND NEEDLE LOCALIZATION, LEFT, BRACKETING WIRE;  Surgeon: Robert Bellow, MD;  Location: [facility] ORS;  Service: General;  Laterality: Left;  . BREAST CYST ASPIRATION Bilateral age 17   neg  . BREAST LUMPECTOMY Left 05/2019   positive  . CHOLECYSTECTOMY    . COLONOSCOPY WITH PROPOFOL N/A 08/02/2018   Procedure: COLONOSCOPY WITH PROPOFOL;  Surgeon: [name], MD;  Location: [facility];  Service: Endoscopy;  Laterality: N/A;  . DILATION AND CURETTAGE OF UTERUS    . goiter surgery    .  MASTECTOMY Left 07/10/2019  . OOPHORECTOMY    . PORTACATH PLACEMENT Left 07/10/2019   Procedure: INSERTION PORT-A-CATH;  Surgeon: Jules Husbands, MD;  Location: [facility] ORS;  Service: General;  Laterality: Left;  . SIMPLE MASTECTOMY WITH AXILLARY SENTINEL NODE BIOPSY Left 07/10/2019   Procedure: SIMPLE MASTECTOMY.; LEFT;  Surgeon: Jules Husbands, MD;  Location: [facility] ORS;  Service: General;  Laterality: Left;  . THYROIDECTOMY, PARTIAL    . TRIGGER FINGER RELEASE     rigt hand thumb and pointer finger    OB History    Gravida  1   Para  1   Term      Preterm       AB      Living  1     SAB      IAB      Ectopic      Multiple      Live Births           Obstetric Comments  Menstrual age: 6  Age 1st Pregnancy: 86           Home Medications    Prior to Admission medications   Medication Sig Start Date End Date Taking? Authorizing Provider  acetaminophen (TYLENOL) 500 MG tablet Take 500 mg by mouth every 6 (six) hours as needed (for pain/headaches.).   Yes [provider]  anastrozole (ARIMIDEX) 1 MG tablet TAKE 1 TABLET BY MOUTH EVERY DAY 11/29/20  Yes Sindy Guadeloupe, MD  aspirin EC 81 MG tablet Take 81 mg by mouth daily.   Yes [provider]  atorvastatin (LIPITOR) 10 MG tablet Take 10 mg by mouth daily.    Yes [provider]  Calcium Carbonate-Vitamin D 600-400 MG-UNIT tablet Take 2 tablets by mouth daily.    Yes [provider]  lisinopril-hydrochlorothiazide (PRINZIDE,ZESTORETIC) 20-12.5 MG tablet Take 1 tablet by mouth daily.   Yes [provider]  metFORMIN (GLUCOPHAGE-XR) 500 MG 24 hr tablet Take 1 tablet by mouth in the morning and at bedtime. 10/29/20  Yes [provider]  Multiple Vitamin (MULTIVITAMIN WITH MINERALS) TABS tablet Take 1 tablet by mouth daily after breakfast. Centrum Silver   Yes [provider]    Family History Family History  Problem Relation Age of Onset  . Breast cancer Cousin        mat cousin  . Hypertension Mother   . Heart attack Father   . Prostate cancer Father   . Diabetes Father   . Colon polyps Father     Social History Social History   Tobacco Use  . Smoking status: Never Smoker  . Smokeless tobacco: Never Used  Vaping Use  . Vaping Use: Never used  Substance Use Topics  . Alcohol use: Not Currently  . Drug use: No     Allergies   Shellfish allergy and Sulfa antibiotics   Review of Systems Review of Systems  Constitutional: Negative for appetite change, chills, diaphoresis, fatigue and fever.  HENT:  Negative for congestion, ear pain, postnasal drip, rhinorrhea, sinus pressure, sinus pain, sneezing and sore throat.   Eyes: Negative for photophobia, pain and visual disturbance.  Respiratory: Negative for cough, chest tightness and shortness of breath.   Cardiovascular: Negative for chest pain and palpitations.  Gastrointestinal: Negative for abdominal pain, diarrhea, nausea and vomiting.  Genitourinary: Negative for dysuria.  Musculoskeletal: Negative for back pain, myalgias, neck pain and neck stiffness.  Skin: Positive for color change and wound. Negative for pallor and  rash.  Neurological: Positive for headaches. Negative for dizziness, syncope, speech difficulty, light-headedness and numbness.  Hematological: Negative for adenopathy. Does not bruise/bleed easily.  All other systems reviewed and are negative.    Physical Exam Triage Vital Signs ED Triage Vitals  Enc Vitals Group     BP 04/06/21 1152 101/82     Pulse Rate 04/06/21 1152 85     Resp 04/06/21 1152 16     Temp 04/06/21 1152 99.2 F (37.3 C)     Temp Source 04/06/21 1152 Oral     SpO2 04/06/21 1152 97 %     Weight 04/06/21 1154 200 lb (90.7 kg)     Height --      Head Circumference --      Peak Flow --      Pain Score 04/06/21 1154 0     Pain Loc --      Pain Edu? --      Excl. in [place]? --    No data found.  Updated Vital Signs BP 101/82 (BP Location: Right Arm)   Pulse 85   Temp 99.2 F (37.3 C) (Oral)   Resp 16   Wt 90.7 kg   SpO2 97%   BMI 37.79 kg/m   Visual Acuity Right Eye Distance:   Left Eye Distance:   Bilateral Distance:    Right Eye Near:   Left Eye Near:    Bilateral Near:     Physical Exam Vitals and nursing note reviewed.  Constitutional:      General: She is not in acute distress.    Appearance: Normal appearance. She is not ill-appearing, toxic-appearing or diaphoretic.  HENT:     Head: Normocephalic and atraumatic.     Nose: Nose normal.     Mouth/Throat:     Mouth:  Mucous membranes are moist.  Eyes:     Extraocular Movements: Extraocular movements intact.     Conjunctiva/sclera: Conjunctivae normal.     Pupils: Pupils are equal, round, and reactive to light.  Cardiovascular:     Rate and Rhythm: Normal rate and regular rhythm.     Pulses: Normal pulses.     Heart sounds: Normal heart sounds. No murmur heard. No friction rub. No gallop.   Pulmonary:     Effort: Pulmonary effort is normal. No respiratory distress.     Breath sounds: Normal breath sounds. No stridor. No wheezing, rhonchi or rales.  Musculoskeletal:     Cervical back: Normal range of motion and neck supple.  Skin:    General: Skin is warm and dry.     Capillary Refill: Capillary refill takes less than 2 seconds.     Coloration: Skin is not jaundiced.     Findings: Abrasion, signs of injury, laceration and lesion present. No bruising.     Comments: There is an abrasion over the left side of her face concentrated over the temple region.  There is no bleeding from that area.  It is tender to palpation.  Some mild soft tissue swelling. She has a 2 cm laceration just above the earlobe at the pinna.  The Band-Aid was removed and she continued to bleed.  It is at one of the folds of the ear.  Would be very difficult to suture.  Will apply Dermabond.  Neurological:     General: No focal deficit present.     Mental Status: She is alert and oriented to person, place, and time.     Cranial Nerves: No cranial  nerve deficit.     Sensory: No sensory deficit.     Motor: No weakness.     Coordination: Coordination normal.     Gait: Gait normal.  Psychiatric:        Mood and Affect: Mood normal.      UC Treatments / Results  Labs (all labs ordered are listed, but only abnormal results are displayed) Labs Reviewed - No data to display  EKG   Radiology No results found.  Procedures Laceration Repair  Date/Time: 04/09/2021 11:49 PM Performed by: Verda Cumins, MD Authorized by:  Verda Cumins, MD   Consent:    Consent obtained:  Verbal   Consent given by:  Patient   Risks, benefits, and alternatives were discussed: yes     Risks discussed:  Infection, pain, need for additional repair and poor wound healing   Alternatives discussed:  No treatment Universal protocol:    Patient identity confirmed:  Verbally with patient Anesthesia:    Anesthesia method:  None Laceration details:    Location:  Ear   Ear location:  L ear   Length (cm):  2   Depth (mm):  1 Pre-procedure details:    Preparation:  Patient was prepped and draped in usual sterile fashion Exploration:    Hemostasis achieved with:  Direct pressure   Imaging outcome: foreign body not noted     Contaminated: no   Treatment:    Wound cleansed with: alcohol.   Amount of cleaning:  Standard   Visualized foreign bodies/material removed: no     Debridement:  None   Undermining:  None Skin repair:    Repair method:  Tissue adhesive Approximation:    Approximation:  Close Repair type:    Repair type:  Simple Post-procedure details:    Dressing:  Open (no dressing)   Procedure completion:  Tolerated well, no immediate complications   (including critical care time)  Medications Ordered in UC Medications - No data to display  Initial Impression / Assessment and Plan / UC Course  I have reviewed the triage vital signs and the nursing notes.  Pertinent labs & imaging results that were available during my care of the patient were reviewed by me and considered in my medical decision making (see chart for details).  Clinical impression: Mechanical fall at church with an abrasion to the temple area on the left side of her face and a laceration to the left ear.  Treatment plan: 1.  The findings and treatment plan were discussed in detail with the patient.  Patient was in agreement. 2.  For the abrasion I just recommended supportive care. 3.  For the laceration we discussed the options in detail.   Given that its in a difficult spot I used Dermabond and did not suture it.  Procedure documented above. 4.  I advised her if she developed any headache, nausea, vomiting, or any other red flags that she should go directly to the ER.  She should also call 911.  She voiced verbal understanding. 5.  I also advised that since she lives alone it would be good if somebody stay with her for the next 24 hours to ensure that she does not have a slow bleed from the injury.  That said, she was neurologically intact on examination. 6.  Tylenol only.  I want her to avoid NSAIDs and aspirin.  She is on 81 mg of aspirin and she can continue that. 7.  Educational handouts provided. 8.  I advised her  to follow-up with her PCP. 9.  She was stable on discharge and she will follow-up here as needed.    Final Clinical Impressions(s) / UC Diagnoses   Final diagnoses:  Laceration of left external ear, initial encounter  Left ear pain  Fall (on) (from) other stairs and steps, initial encounter  Facial abrasion, initial encounter  Injury of head, initial encounter     Discharge Instructions     As we discussed, he did have a laceration on the external aspect of your ear.  It is in a difficult spot so I have elected to use Dermabond and not suture it.  Please see educational handouts on the tissue adhesive Dermabond that I use. As we discussed, if you develop a headache, nausea, vomiting, or change in behavior a could be the sign of something going on inside of your head and brain and you need to call 911.  I have asked you to let a friend know that I want him to check in on you for the next 24 hours.  This is because you live alone. You can use Tylenol only if you needed.  I do not want you to use any Motrin, Advil, ibuprofen, Naprosyn, or Aleve.  You can continue with your 81 mg of aspirin.    ED Prescriptions    None     PDMP not reviewed this encounter.   Verda Cumins, MD 04/09/21 2356

## 2021-04-30 ENCOUNTER — Ambulatory Visit
Admission: RE | Admit: 2021-04-30 | Discharge: 2021-04-30 | Disposition: A | Discharge status: Home / Self Care | Payer: Medicare Other | Source: Ambulatory Visit | Attending: Obstetrics & Gynecology | Admitting: Obstetrics & Gynecology

## 2021-04-30 ENCOUNTER — Other Ambulatory Visit: Payer: Self-pay

## 2021-04-30 DIAGNOSIS — Z1231 Encounter for screening mammogram for malignant neoplasm of breast: Secondary | ICD-10-CM | POA: Diagnosis not present

## 2021-08-01 ENCOUNTER — Encounter: Payer: Self-pay | Admitting: Nurse Practitioner

## 2021-08-07 ENCOUNTER — Inpatient Hospital Stay (HOSPITAL_BASED_OUTPATIENT_CLINIC_OR_DEPARTMENT_OTHER): Payer: Medicare Other | Admitting: Nurse Practitioner

## 2021-08-07 ENCOUNTER — Other Ambulatory Visit: Payer: Self-pay

## 2021-08-07 ENCOUNTER — Inpatient Hospital Stay: Payer: Medicare Other

## 2021-08-07 ENCOUNTER — Encounter: Payer: Self-pay | Admitting: Nurse Practitioner

## 2021-08-07 ENCOUNTER — Inpatient Hospital Stay: Payer: Medicare Other | Attending: Nurse Practitioner

## 2021-08-07 VITALS — BP 145/74 | HR 82 | Temp 98.7°F | Resp 20 | Wt 210.7 lb

## 2021-08-07 DIAGNOSIS — C50412 Malignant neoplasm of upper-outer quadrant of left female breast: Secondary | ICD-10-CM

## 2021-08-07 DIAGNOSIS — Z923 Personal history of irradiation: Secondary | ICD-10-CM | POA: Insufficient documentation

## 2021-08-07 DIAGNOSIS — Z79811 Long term (current) use of aromatase inhibitors: Secondary | ICD-10-CM | POA: Insufficient documentation

## 2021-08-07 DIAGNOSIS — I10 Essential (primary) hypertension: Secondary | ICD-10-CM | POA: Diagnosis not present

## 2021-08-07 DIAGNOSIS — Z7982 Long term (current) use of aspirin: Secondary | ICD-10-CM | POA: Insufficient documentation

## 2021-08-07 DIAGNOSIS — Z170 Estrogen receptor positive status [ER+]: Secondary | ICD-10-CM | POA: Insufficient documentation

## 2021-08-07 DIAGNOSIS — E785 Hyperlipidemia, unspecified: Secondary | ICD-10-CM | POA: Insufficient documentation

## 2021-08-07 DIAGNOSIS — Z79899 Other long term (current) drug therapy: Secondary | ICD-10-CM | POA: Insufficient documentation

## 2021-08-07 DIAGNOSIS — Z9012 Acquired absence of left breast and nipple: Secondary | ICD-10-CM | POA: Diagnosis not present

## 2021-08-07 DIAGNOSIS — R7989 Other specified abnormal findings of blood chemistry: Secondary | ICD-10-CM

## 2021-08-07 DIAGNOSIS — Z7984 Long term (current) use of oral hypoglycemic drugs: Secondary | ICD-10-CM | POA: Diagnosis not present

## 2021-08-07 DIAGNOSIS — Z853 Personal history of malignant neoplasm of breast: Secondary | ICD-10-CM

## 2021-08-07 DIAGNOSIS — Z08 Encounter for follow-up examination after completed treatment for malignant neoplasm: Secondary | ICD-10-CM

## 2021-08-07 DIAGNOSIS — Z9221 Personal history of antineoplastic chemotherapy: Secondary | ICD-10-CM | POA: Diagnosis not present

## 2021-08-07 HISTORY — DX: Other specified abnormal findings of blood chemistry: R79.89

## 2021-08-07 LAB — CBC WITH DIFFERENTIAL/PLATELET
Abs Immature Granulocytes: 0.03 10*3/uL (ref 0.00–0.07)
Basophils Absolute: 0 10*3/uL (ref 0.0–0.1)
Basophils Relative: 1 %
Eosinophils Absolute: 0.2 10*3/uL (ref 0.0–0.5)
Eosinophils Relative: 4 %
HCT: 36.1 % (ref 36.0–46.0)
Hemoglobin: 12.5 g/dL (ref 12.0–15.0)
Immature Granulocytes: 1 %
Lymphocytes Relative: 18 %
Lymphs Abs: 1 10*3/uL (ref 0.7–4.0)
MCH: 30.3 pg (ref 26.0–34.0)
MCHC: 34.6 g/dL (ref 30.0–36.0)
MCV: 87.6 fL (ref 80.0–100.0)
Monocytes Absolute: 0.5 10*3/uL (ref 0.1–1.0)
Monocytes Relative: 9 %
Neutro Abs: 3.9 10*3/uL (ref 1.7–7.7)
Neutrophils Relative %: 67 %
Platelets: 240 10*3/uL (ref 150–400)
RBC: 4.12 MIL/uL (ref 3.87–5.11)
RDW: 13.2 % (ref 11.5–15.5)
WBC: 5.7 10*3/uL (ref 4.0–10.5)
nRBC: 0 % (ref 0.0–0.2)

## 2021-08-07 LAB — COMPREHENSIVE METABOLIC PANEL
ALT: 50 U/L — ABNORMAL HIGH (ref 0–44)
AST: 76 U/L — ABNORMAL HIGH (ref 15–41)
Albumin: 3.9 g/dL (ref 3.5–5.0)
Alkaline Phosphatase: 80 U/L (ref 38–126)
Anion gap: 9 (ref 5–15)
BUN: 15 mg/dL (ref 8–23)
CO2: 30 mmol/L (ref 22–32)
Calcium: 9.8 mg/dL (ref 8.9–10.3)
Chloride: 96 mmol/L — ABNORMAL LOW (ref 98–111)
Creatinine, Ser: 0.93 mg/dL (ref 0.44–1.00)
GFR, Estimated: >60 mL/min (ref >60)
Glucose, Bld: 213 mg/dL — ABNORMAL HIGH (ref 70–99)
Potassium: 4.1 mmol/L (ref 3.5–5.1)
Sodium: 135 mmol/L (ref 135–145)
Total Bilirubin: 0.7 mg/dL (ref 0.3–1.2)
Total Protein: 7.5 g/dL (ref 6.5–8.1)

## 2021-08-07 MED ORDER — SODIUM CHLORIDE 0.9 % IV SOLN
Freq: Once | INTRAVENOUS | Status: AC
  Filled 2021-08-07: qty 250

## 2021-08-07 MED ORDER — ZOLEDRONIC ACID 4 MG/100ML IV SOLN
4.0000 mg | INTRAVENOUS | Status: DC
  Administered 2021-08-07: 4 mg via INTRAVENOUS
  Filled 2021-08-07: qty 100

## 2021-08-07 NOTE — Patient Instructions (Signed)
[facility]  Discharge Instructions: Thank you for choosing [facility] to provide your oncology and hematology care.  If you have a lab appointment with the [facility], please go directly to the [facility] and check in at the registration area.  Wear comfortable clothing and clothing appropriate for easy access to any Portacath or PICC line.   We strive to give you quality time with your provider. You may need to reschedule your appointment if you arrive late (15 or more minutes).  Arriving late affects you and other patients whose appointments are after yours.  Also, if you miss three or more appointments without notifying the office, you may be dismissed from the clinic at the provider's discretion.      For prescription refill requests, have your pharmacy contact our office and allow 72 hours for refills to be completed.    Today you received the following : [place]   To help prevent nausea and vomiting after your treatment, we encourage you to take your nausea medication as directed.  BELOW ARE SYMPTOMS THAT SHOULD BE REPORTED IMMEDIATELY: *FEVER GREATER THAN 100.4 F (38 C) OR HIGHER *CHILLS OR SWEATING *NAUSEA AND VOMITING THAT IS NOT CONTROLLED WITH YOUR NAUSEA MEDICATION *UNUSUAL SHORTNESS OF BREATH *UNUSUAL BRUISING OR BLEEDING *URINARY PROBLEMS (pain or burning when urinating, or frequent urination) *BOWEL PROBLEMS (unusual diarrhea, constipation, pain near the anus) TENDERNESS IN MOUTH AND THROAT WITH OR WITHOUT PRESENCE OF ULCERS (sore throat, sores in mouth, or a toothache) UNUSUAL RASH, SWELLING OR PAIN  UNUSUAL VAGINAL DISCHARGE OR ITCHING   Items with * indicate a potential emergency and should be followed up as soon as possible or go to the Emergency Department if any problems should occur.  Please show the CHEMOTHERAPY ALERT CARD or IMMUNOTHERAPY ALERT CARD at check-in to the Emergency Department and triage  nurse.  Should you have questions after your visit or need to cancel or reschedule your appointment, please contact [facility]  [phone] and follow the prompts.  Office hours are 8:00 a.m. to 4:30 p.m. Monday - Friday. Please note that voicemails left after 4:00 p.m. may not be returned until the following business day.  We are closed weekends and major holidays. You have access to a nurse at all times for urgent questions. Please call the main number to the clinic [phone] and follow the prompts.  For any non-urgent questions, you may also contact your provider using MyChart. We now offer e-Visits for anyone 6 and older to request care online for non-urgent symptoms. For details visit [email or web address].   Also download the MyChart app! Go to the app store, search "MyChart", open the app, select [facility], and log in with your MyChart username and password.  Due to Covid, a mask is required upon entering the hospital/clinic. If you do not have a mask, one will be given to you upon arrival. For doctor visits, patients may have 1 support person aged 56 or older with them. For treatment visits, patients cannot have anyone with them due to current Covid guidelines and our immunocompromised population.

## 2021-08-07 NOTE — Progress Notes (Signed)
Hematology/Oncology Consult Note [facility] Telephone:([phone] [phone]  Patient Care Team: Ezequiel Kayser, MD (Inactive) as PCP - General (Internal Medicine) Sindy Guadeloupe, MD as Consulting Physician (Oncology) Bary Castilla, Forest Gleason, MD as Consulting Physician (General Surgery) Rico Junker, RN as Oncology Nurse Navigator [name], MD as Referring Physician (Radiation Oncology)   Name of the patient: Amanda Wade  [id number]  01-02-1951   Date of visit: 08/07/21  Diagnosis- pathological prognostic stage IA [id number] Invasive mammary carcinoma of the left breast status post lumpectomy and sentinel lymph node biopsy  Chief complaint/ Reason for visit-routine follow-up of breast cancer and to receive Zometa  Heme/Onc history: Patient is a 70 year old postmenopausal female with a past medical history significant for hypertension hyperlipidemia among other medical problems.  She recently underwent a screening mammogram on 04/28/2019 which showed possible mass in the left breast.  This was followed by diagnostic mammogram and ultrasound which showed bilobar hypo-Echoic mass at the 2:30 position of the left breast measuring 0.5 x 0.6 x 1.1 cm.  Hypoechoic mass at the 2 o'clock position as well.  And another bilobed irregular hypoechoic mass at the 3 o'clock position measuring 4 x 5 x 6 mm.  Patient had biopsy of all these 3 masses.  The 3 o'clock position breast mass came back as invasive mammary carcinoma 6 mm, grade 1 strongly ER PR positive greater than 90% and HER-2/neu negative.  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  230 breast mass also came back as intermediate grade DCIS.  There was also another mass noted in the left breast which was biopsied and was 5 mm grade 1 ER PR positive and HER-2/neu negative.   Patient was taken for lumpectomy and sentinel lymph node biopsy by Dr. Bary Castilla on 06/05/2019.Final pathology showed invasive  mammary carcinoma grade 1 ER PR positive and HER-2/neu negative at 4 different biopsy sites with extensive DCIS predominantly micropapillary type.  One lymph node was also involved with macro metastatic carcinoma and was negative for extranodal extension   Patient's case was discussed at tumor board and there was a concern that there may be potentially more areas of abnormality given extensive DCIS and invasive cancer seen in multiple foci in the lumpectomy specimen.  Plan was to proceed with bilateral MRI.Breast MRI showed a large area of clumped discontinued non-mass enhancement in the left upper breast spanning an area of 9.8 cm in AP dimension suspicious for residual DCIS or invasive carcinoma.  Patient therefore proceeded with r left mastectomy which showed small area of residual DCIS but no evidence of invasive malignancy.   Patient also had a MammaPrint testing done on her lumpectomy specimen which came back as high risk group.  29% chance of recurrence without chemotherapy at 5 years.  Greater than 12% chemo benefit and estimated > 93% survival at 5 years with chemotherapy and hormone therapy   Patient completed 4 cycles of adjuvant TC chemotherapy on 10/26/2019.  She completed adjuvant radiation treatment.  Patient started taking Arimidex in December 2020    Interval history-patient is 70 year old female who returns to clinic for further evaluation and continued surveillance of breast cancer and consideration of Zometa.  She continues to feel well.  Denies new lumps or bumps.  No fatigue, night sweats, recurrent infections.  No chest pain or shortness of breath.  ECOG PS- 1 Pain scale- 0  Review of systems- Review of Systems  Constitutional:  Negative for chills, fever, malaise/fatigue and  weight loss.  HENT:  Negative for congestion, ear discharge and nosebleeds.   Eyes:  Negative for blurred vision.  Respiratory:  Negative for cough, hemoptysis, sputum production, shortness of breath and  wheezing.   Cardiovascular:  Negative for chest pain, palpitations, orthopnea and claudication.  Gastrointestinal:  Negative for abdominal pain, blood in stool, constipation, diarrhea, heartburn, melena, nausea and vomiting.  Genitourinary:  Negative for dysuria, flank pain, frequency, hematuria and urgency.  Musculoskeletal:  Negative for back pain, joint pain and myalgias.  Skin:  Negative for rash.  Neurological:  Negative for dizziness, tingling, focal weakness, seizures, weakness and headaches.  Endo/Heme/Allergies:  Does not bruise/bleed easily.  Psychiatric/Behavioral:  Negative for depression and suicidal ideas. The patient does not have insomnia.     Allergies  Allergen Reactions   Shellfish Allergy Rash   Sulfa Antibiotics Rash    Past Medical History:  Diagnosis Date   Breast cancer ([place])    left breast   Complication of anesthesia    Headache    Hyperlipidemia    Hypertension    Malignant neoplasm of upper-outer quadrant of left breast in female, estrogen receptor positive ([place]) 05/19/2019   Personal history of chemotherapy    Personal history of radiation therapy    PONV (postoperative nausea and vomiting) 1987   Pre-diabetes    Trigger finger of right hand    and thumb   Vertigo    intermittent especially lying on left side    Past Surgical History:  Procedure Laterality Date   ABDOMINAL HYSTERECTOMY  2004   complete   BREAST BIOPSY Left 05/05/2019   [facility]- ribbon clip   BREAST BIOPSY Left 05/16/2019   [facility]-DCIS   BREAST BIOPSY Left 05/16/2019   [facility]- X clip   BREAST BIOPSY WITH SENTINEL LYMPH NODE BIOPSY AND NEEDLE LOCALIZATION Left 06/05/2019   Procedure: BREAST BIOPSY WITH SENTINEL LYMPH NODE BIOPSY AND NEEDLE LOCALIZATION, LEFT, BRACKETING WIRE;  Surgeon: Robert Bellow, MD;  Location: [facility] ORS;  Service: General;  Laterality: Left;   BREAST CYST ASPIRATION Bilateral age 44   neg   BREAST LUMPECTOMY Left 05/2019   positive   CHOLECYSTECTOMY      COLONOSCOPY WITH PROPOFOL N/A 08/02/2018   Procedure: COLONOSCOPY WITH PROPOFOL;  Surgeon: [name], MD;  [facility] ENDOSCOPY;  Service: Endoscopy;  Laterality: N/A;   DILATION AND CURETTAGE OF UTERUS     goiter surgery     MASTECTOMY Left 07/10/2019   OOPHORECTOMY     PORTACATH PLACEMENT Left 07/10/2019   Procedure: INSERTION PORT-A-CATH;  Surgeon: Jules Husbands, MD;  Location: [facility] ORS;  Service: General;  Laterality: Left;   SIMPLE MASTECTOMY WITH AXILLARY SENTINEL NODE BIOPSY Left 07/10/2019   Procedure: SIMPLE MASTECTOMY.; LEFT;  Surgeon: Jules Husbands, MD;  Location: [facility] ORS;  Service: General;  Laterality: Left;   THYROIDECTOMY, PARTIAL     TRIGGER FINGER RELEASE     rigt hand thumb and pointer finger    Social History   Socioeconomic History   Marital status: Divorced    Spouse name: Not on file   Number of children: Not on file   Years of education: Not on file   Highest education level: Not on file  Occupational History   Not on file  Tobacco Use   Smoking status: Never   Smokeless tobacco: Never  Vaping Use   Vaping Use: Never used  Substance and Sexual Activity   Alcohol use: Not Currently   Drug use: No  Sexual activity: Not Currently  Other Topics Concern   Not on file  Social History Narrative   Not on file   Social Determinants of Health   Financial Resource Strain: Not on file  Food Insecurity: Not on file  Transportation Needs: Not on file  Physical Activity: Not on file  Stress: Not on file  Social Connections: Not on file  Intimate Partner Violence: Not on file    Family History  Problem Relation Age of Onset   Breast cancer Cousin        mat cousin   Hypertension Mother    Heart attack Father    Prostate cancer Father    Diabetes Father    Colon polyps Father     Current Outpatient Medications:    acetaminophen (TYLENOL) 500 MG tablet, Take 500 mg by mouth every 6 (six) hours as needed (for pain/headaches.)., Disp: ,  Rfl:    anastrozole (ARIMIDEX) 1 MG tablet, TAKE 1 TABLET BY MOUTH EVERY DAY, Disp: 90 tablet, Rfl: 3   aspirin EC 81 MG tablet, Take 81 mg by mouth daily., Disp: , Rfl:    atorvastatin (LIPITOR) 10 MG tablet, Take 10 mg by mouth daily. , Disp: , Rfl:    Calcium Carbonate-Vitamin D 600-400 MG-UNIT tablet, Take 2 tablets by mouth daily. , Disp: , Rfl:    lisinopril-hydrochlorothiazide (PRINZIDE,ZESTORETIC) 20-12.5 MG tablet, Take 1 tablet by mouth daily., Disp: , Rfl:    metFORMIN (GLUCOPHAGE-XR) 500 MG 24 hr tablet, Take 1 tablet by mouth in the morning and at bedtime., Disp: , Rfl:    Multiple Vitamin (MULTIVITAMIN WITH MINERALS) TABS tablet, Take 1 tablet by mouth daily after breakfast. Centrum Silver, Disp: , Rfl:   Physical exam:  Vitals:   08/07/21 0916  BP: (!) 145/74  Pulse: 82  Resp: 20  Temp: 98.7 F (37.1 C)  SpO2: 98%  Weight: 210 lb 11.2 oz (95.6 kg)   Physical Exam Constitutional:      General: She is not in acute distress.    Appearance: She is well-developed. She is not ill-appearing.  HENT:     Head: Atraumatic.     Nose: Nose normal.     Mouth/Throat:     Pharynx: No oropharyngeal exudate.  Eyes:     General: No scleral icterus.    Conjunctiva/sclera: Conjunctivae normal.  Cardiovascular:     Rate and Rhythm: Normal rate and regular rhythm.     Heart sounds: Normal heart sounds.  Pulmonary:     Effort: Pulmonary effort is normal.     Breath sounds: Normal breath sounds.  Abdominal:     General: Bowel sounds are normal. There is no distension.     Palpations: Abdomen is soft.     Tenderness: There is no abdominal tenderness.  Musculoskeletal:        General: No tenderness or deformity. Normal range of motion.     Cervical back: Normal range of motion and neck supple.  Skin:    General: Skin is warm and dry.     Coloration: Skin is not pale.  Neurological:     General: No focal deficit present.     Mental Status: She is alert and oriented to person,  place, and time.  Psychiatric:        Mood and Affect: Mood normal.        Behavior: Behavior normal.  Breast exam: Left breast-status postmastectomy without reconstruction.  No palpable nodularity.  No palpable axillary adenopathy. Right breast-no  palpable masses, axillary adenopathy, tissue changes were discharge   CMP Latest Ref Rng & Units 08/07/2021  Glucose 70 - 99 mg/dL 213(H)  BUN 8 - 23 mg/dL 15  Creatinine 0.44 - 1.00 mg/dL 0.93  Sodium 135 - 145 mmol/L 135  Potassium 3.5 - 5.1 mmol/L 4.1  Chloride 98 - 111 mmol/L 96(L)  CO2 22 - 32 mmol/L 30  Calcium 8.9 - 10.3 mg/dL 9.8  Total Protein 6.5 - 8.1 g/dL 7.5  Total Bilirubin 0.3 - 1.2 mg/dL 0.7  Alkaline Phos 38 - 126 U/L 80  AST 15 - 41 U/L 76(H)  ALT 0 - 44 U/L 50(H)   CBC Latest Ref Rng & Units 08/07/2021  WBC 4.0 - 10.5 K/uL 5.7  Hemoglobin 12.0 - 15.0 g/dL 12.5  Hematocrit 36.0 - 46.0 % 36.1  Platelets 150 - 400 K/uL 240   Radiographic:  I independently reviewed below imaging and agree with findings as described 04/30/21- MM 3D Screen Breast Uni Right-  Bi-Rads Category: 1: negative   Assessment and plan- Patient is a 70 y.o. female with invasive mammary carcinoma of the left breast pathological prognostic stage I [id number] ER PR positive HER-2/neu negative status post lumpectomy and sentinel lymph node biopsy. She is high risk mammprint score and s/p mastectomy given concerns for residual disease. She is s/p 4 cycles of adjuvant TC chemotherapy and adjuvant radiation treatment. She is here for routine follow-up of breast cancer and to receive Zometa  Labs today reviewed and acceptable to proceed with Zometa.  Clinically she is doing well without symptoms or signs of recurrence.  She will continue Arimidex along with calcium and vitamin D.  Continue exercise as tolerated.  Plan to repeat bone density scan in December 2022.  She will get mammogram in June 2023 with her GYN.  LFTs are elevated.  Plan for ultrasound  to evaluate.  RTC in 6 months for labs, Dr. Janese Banks, zometa  Visit Diagnosis 1. Encounter for follow-up surveillance of breast cancer   2. Malignant neoplasm of upper-outer quadrant of left breast in female, estrogen receptor positive ([place])   3. Long term current use of aromatase inhibitor   4. Elevated LFTs    Beckey Rutter, DNP, AGNP-C [place] [facility] [phone] (clinic) 08/07/2021 3:09 PM

## 2021-08-07 NOTE — Progress Notes (Signed)
Patient states she wants to know if she needs another Bone Density test due to her starting hormone treatment again.

## 2021-08-14 ENCOUNTER — Ambulatory Visit
Admission: RE | Admit: 2021-08-14 | Discharge: 2021-08-14 | Disposition: A | Discharge status: Home / Self Care | Payer: Medicare Other | Source: Ambulatory Visit | Attending: Nurse Practitioner | Admitting: Nurse Practitioner

## 2021-08-14 ENCOUNTER — Other Ambulatory Visit: Payer: Self-pay

## 2021-08-14 DIAGNOSIS — R7989 Other specified abnormal findings of blood chemistry: Secondary | ICD-10-CM | POA: Insufficient documentation

## 2021-08-15 ENCOUNTER — Telehealth: Payer: Self-pay

## 2021-08-15 ENCOUNTER — Encounter: Payer: Self-pay | Admitting: *Deleted

## 2021-08-15 ENCOUNTER — Other Ambulatory Visit: Payer: Self-pay | Admitting: Nurse Practitioner

## 2021-08-15 DIAGNOSIS — R7989 Other specified abnormal findings of blood chemistry: Secondary | ICD-10-CM

## 2021-08-15 NOTE — Telephone Encounter (Signed)
I called patient to inform her of recent results but patient did not answer.I did leave voicemail for patient to give me a call back to discuss results and future planning per lauren allen.

## 2021-08-15 NOTE — Telephone Encounter (Signed)
Patient called back I spoke with her regarding her ultrasound yesterday and how is showed  fatty liver disease. I told patient that Beckey Rutter has sent over a referral to GI. Patient stated she understood and that the GI had called her a few minutes prior to our conversation to schedule appointment for a November.

## 2021-10-07 ENCOUNTER — Encounter: Payer: Self-pay | Admitting: Gastroenterology

## 2021-10-07 ENCOUNTER — Ambulatory Visit (INDEPENDENT_AMBULATORY_CARE_PROVIDER_SITE_OTHER): Payer: Medicare Other | Admitting: Gastroenterology

## 2021-10-07 ENCOUNTER — Other Ambulatory Visit: Payer: Self-pay

## 2021-10-07 VITALS — BP 141/74 | HR 91 | Temp 98.3°F | Ht 60.0 in | Wt 200.2 lb

## 2021-10-07 DIAGNOSIS — R635 Abnormal weight gain: Secondary | ICD-10-CM

## 2021-10-07 DIAGNOSIS — K760 Fatty (change of) liver, not elsewhere classified: Secondary | ICD-10-CM

## 2021-10-07 DIAGNOSIS — R7989 Other specified abnormal findings of blood chemistry: Secondary | ICD-10-CM

## 2021-10-07 DIAGNOSIS — Z86010 Personal history of colonic polyps: Secondary | ICD-10-CM

## 2021-10-07 DIAGNOSIS — D126 Benign neoplasm of colon, unspecified: Secondary | ICD-10-CM

## 2021-10-07 MED ORDER — PEG 3350-KCL-NA BICARB-NACL 420 G PO SOLR: 4000.0000 mL | Freq: Once | ORAL | 0 refills | Status: AC

## 2021-10-07 NOTE — Progress Notes (Signed)
Cephas Darby, MD [address]  [place]  [place], [place] [address]  Main: [phone]  Fax: [phone]    Gastroenterology Consultation  Referring Provider:     Verlon Au, NP Primary Care Physician:  Ezequiel Kayser, MD (Inactive) Primary Gastroenterologist:  Dr. Cephas Darby Reason for Consultation:     Elevated LFTs, history of colon polyps        HPI:   Amanda Wade is a 70 y.o. female referred by Dr. Ezequiel Kayser, MD (Inactive)  for consultation & management of elevated LFTs.  Patient has history of breast cancer stage I, ER positive HER2 negative status postlumpectomy, s/p adjuvant chemo and radiation.  Patient is currently maintained on Arimidex with calcium and vitamin D supplementation.  Patient is referred to me for worsening LFTs.  She is first noticed to have mildly elevated AST of 49 in 6/21, have been progressively worsening since then most recently AST 76, ALT 58, normal ALT and phosphatase and T bili.  She underwent ultrasound which revealed fatty liver.  And status postcholecystectomy without any biliary dilatation.  Patient also has history of diabetes, hemoglobin A1c was 8, started on metformin.  Patient has cut back on carbohydrates, started walking and she lost about 10 pounds within last 3 months and lost about 15 pounds within last 6 months.  Her hemoglobin A1c is improving.  Patient does not have any other GI symptoms  She has history of tubular adenomas of the colon in 2019, due for surveillance colonoscopy.  Patient does not smoke or drink alcohol  Hepatic Function Latest Ref Rng & Units 08/07/2021 02/04/2021 07/25/2020  Total Protein 6.5 - 8.1 g/dL 7.5 7.0 7.1  Albumin 3.5 - 5.0 g/dL 3.9 3.8 3.8  AST 15 - 41 U/L 76(H) 49(H) 44(H)  ALT 0 - 44 U/L 50(H) 43 33  Alk Phosphatase 38 - 126 U/L 80 76 80  Total Bilirubin 0.3 - 1.2 mg/dL 0.7 0.7 0.4    NSAIDs: None  Antiplts/Anticoagulants/Anti thrombotics: None  GI Procedures:  Colonoscopy  08/02/2018 - Preparation of the colon was fair. - Diverticulosis in the sigmoid colon, in the descending colon, in the transverse colon, in the distal transverse colon and in the ascending colon. - One 3 mm polyp in the transverse colon, removed with a cold biopsy forceps. Resected and retrieved. - Two 3 mm polyps at the hepatic flexure, removed with a cold biopsy forceps. Resected and retrieved. - The distal rectum and anal verge are normal on retroflexion view.  DIAGNOSIS:  A.  COLON POLYP, TRANSVERSE; COLD BIOPSY:  - TUBULAR ADENOMA.  - NEGATIVE FOR HIGH-GRADE DYSPLASIA AND MALIGNANCY.   B.  COLON POLYPS X 2, HEPATIC FLEXURE; COLD BIOPSY:  - TUBULAR ADENOMAS, 5 FRAGMENTS.  - NEGATIVE FOR HIGH-GRADE DYSPLASIA AND MALIGNANCY.   Past Medical History:  Diagnosis Date   Breast cancer ([place])    left breast   Complication of anesthesia    Headache    Hyperlipidemia    Hypertension    Malignant neoplasm of upper-outer quadrant of left breast in female, estrogen receptor positive ([place]) 05/19/2019   Personal history of chemotherapy    Personal history of radiation therapy    PONV (postoperative nausea and vomiting) 1987   Pre-diabetes    Trigger finger of right hand    and thumb   Vertigo    intermittent especially lying on left side    Past Surgical History:  Procedure Laterality Date   ABDOMINAL  HYSTERECTOMY  2004   complete   BREAST BIOPSY Left 05/05/2019   [facility]- ribbon clip   BREAST BIOPSY Left 05/16/2019   [facility]-DCIS   BREAST BIOPSY Left 05/16/2019   [facility]- [facility] clip   BREAST BIOPSY WITH SENTINEL LYMPH NODE BIOPSY AND NEEDLE LOCALIZATION Left 06/05/2019   Procedure: BREAST BIOPSY WITH SENTINEL LYMPH NODE BIOPSY AND NEEDLE LOCALIZATION, LEFT, BRACKETING WIRE;  Surgeon: Robert Bellow, MD;  Location: [facility] ORS;  Service: General;  Laterality: Left;   BREAST CYST ASPIRATION Bilateral age 56   neg   BREAST LUMPECTOMY Left 05/2019   positive   CHOLECYSTECTOMY     COLONOSCOPY WITH  PROPOFOL N/A 08/02/2018   Procedure: COLONOSCOPY WITH PROPOFOL;  Surgeon: [name], MD;  Location: [facility] ENDOSCOPY;  Service: Endoscopy;  Laterality: N/A;   DILATION AND CURETTAGE OF UTERUS     goiter surgery     MASTECTOMY Left 07/10/2019   OOPHORECTOMY     PORTACATH PLACEMENT Left 07/10/2019   Procedure: INSERTION PORT-A-CATH;  Surgeon: Jules Husbands, MD;  Location: [facility] ORS;  Service: General;  Laterality: Left;   SIMPLE MASTECTOMY WITH AXILLARY SENTINEL NODE BIOPSY Left 07/10/2019   Procedure: SIMPLE MASTECTOMY.; LEFT;  Surgeon: Jules Husbands, MD;  Location: [facility] ORS;  Service: General;  Laterality: Left;   THYROIDECTOMY, PARTIAL     TRIGGER FINGER RELEASE     rigt hand thumb and pointer finger    Current Outpatient Medications:    acetaminophen (TYLENOL) 500 MG tablet, Take 500 mg by mouth every 6 (six) hours as needed (for pain/headaches.)., Disp: , Rfl:    anastrozole (ARIMIDEX) 1 MG tablet, TAKE 1 TABLET BY MOUTH EVERY DAY, Disp: 90 tablet, Rfl: 3   aspirin EC 81 MG tablet, Take 81 mg by mouth daily., Disp: , Rfl:    atorvastatin (LIPITOR) 10 MG tablet, Take 10 mg by mouth daily. , Disp: , Rfl:    Calcium Carbonate-Vitamin D 600-400 MG-UNIT tablet, Take 2 tablets by mouth daily. , Disp: , Rfl:    lisinopril-hydrochlorothiazide (PRINZIDE,ZESTORETIC) 20-12.5 MG tablet, Take 1 tablet by mouth daily., Disp: , Rfl:    metFORMIN (GLUCOPHAGE-XR) 500 MG 24 hr tablet, Take 1 tablet by mouth in the morning and at bedtime., Disp: , Rfl:    metFORMIN (GLUCOPHAGE-XR) 750 MG 24 hr tablet, Take 1,500 mg by mouth daily., Disp: , Rfl:    Multiple Vitamin (MULTIVITAMIN WITH MINERALS) TABS tablet, Take 1 tablet by mouth daily after breakfast. Centrum Silver, Disp: , Rfl:    polyethylene glycol-electrolytes (NULYTELY) 420 g solution, Take 4,000 mLs by mouth once for 1 dose., Disp: 4000 mL, Rfl: 0    Family History  Problem Relation Age of Onset   Breast cancer Cousin        mat cousin    Hypertension Mother    Heart attack Father    Prostate cancer Father    Diabetes Father    Colon polyps Father      Social History   Tobacco Use   Smoking status: Never   Smokeless tobacco: Never  Vaping Use   Vaping Use: Never used  Substance Use Topics   Alcohol use: Not Currently   Drug use: No    Allergies as of 10/07/2021 - Review Complete 10/07/2021  Allergen Reaction Noted   Shellfish allergy Rash 10/13/2018   Sulfa antibiotics Rash 07/31/2017    Review of Systems:    All systems reviewed and negative except where noted in HPI.   Physical  Exam:  BP (!) 141/74 (BP Location: Right Arm, Patient Position: Sitting, Cuff Size: Large)   Pulse 91   Temp 98.3 F (36.8 C) (Oral)   Ht 5' (1.524 m)   Wt 200 lb 3.2 oz (90.8 kg)   BMI 39.10 kg/m  No LMP recorded. Patient has had a hysterectomy.  General:   Alert,  Well-developed, well-nourished, pleasant and cooperative in NAD Head:  Normocephalic and atraumatic. Eyes:  Sclera clear, no icterus.   Conjunctiva pink. Ears:  Normal auditory acuity. Nose:  No deformity, discharge, or lesions. Mouth:  No deformity or lesions,oropharynx pink & moist. Neck:  Supple; no masses or thyromegaly. Lungs:  Respirations even and unlabored.  Clear throughout to auscultation.   No wheezes, crackles, or rhonchi. No acute distress. Heart:  Regular rate and rhythm; no murmurs, clicks, rubs, or gallops. Abdomen:  Normal bowel sounds. Soft, obese, non-tender and non-distended without masses, hepatosplenomegaly or hernias noted.  No guarding or rebound tenderness.   Rectal: Not performed Msk:  Symmetrical without gross deformities. Good, equal movement & strength bilaterally. Pulses:  Normal pulses noted. Extremities:  No clubbing or edema.  No cyanosis. Neurologic:  Alert and oriented x3;  grossly normal neurologically. Skin:  Intact without significant lesions or rashes. No jaundice. Psych:  Alert and cooperative. Normal mood and  affect.  Imaging Studies: Reviewed  Assessment and Plan:   Amanda Wade is a 70 y.o. pleasant Caucasian female with history of metabolic syndrome, stage I breast cancer status postlumpectomy, adjuvant chemo and radiation, currently maintained on Arimidex is seen in consultation for elevated LFTs, fatty liver and history of colon polyps  Elevated transaminases most likely secondary to fatty liver from metabolic syndrome Recommend secondary liver disease work-up, labs ordered today Discussed about tight control of diabetes, continue metformin Recheck LFTs today Continue weight loss by following healthy diet and exercise  History of tubular adenomas of the colon Recommend surveillance colonoscopy   Follow up in 6 months   Cephas Darby, MD

## 2021-10-08 LAB — HEPATIC FUNCTION PANEL
ALT: 36 IU/L — ABNORMAL HIGH (ref 0–32)
AST: 44 IU/L — ABNORMAL HIGH (ref 0–40)
Albumin: 4.5 g/dL (ref 3.7–4.7)
Alkaline Phosphatase: 84 IU/L (ref 44–121)
Bilirubin Total: 0.3 mg/dL (ref 0.0–1.2)
Bilirubin, Direct: 0.12 mg/dL (ref 0.00–0.40)
Total Protein: 7.1 g/dL (ref 6.0–8.5)

## 2021-10-09 LAB — HEPATITIS B SURFACE ANTIGEN: Hepatitis B Surface Ag: NEGATIVE

## 2021-10-09 LAB — CERULOPLASMIN: Ceruloplasmin: 26.9 mg/dL (ref 19.0–39.0)

## 2021-10-09 LAB — IRON,TIBC AND FERRITIN PANEL
Ferritin: 76 ng/mL (ref 15–150)
Iron Saturation: 20 % (ref 15–55)
Iron: 72 ug/dL (ref 27–139)
Total Iron Binding Capacity: 352 ug/dL (ref 250–450)
UIBC: 280 ug/dL (ref 118–369)

## 2021-10-09 LAB — CELIAC DISEASE PANEL
Endomysial IgA: NEGATIVE
IgA/Immunoglobulin A, Serum: 194 mg/dL (ref 64–422)
Transglutaminase IgA: <2 U/mL (ref 0–3)

## 2021-10-09 LAB — HEPATITIS C ANTIBODY: Hep C Virus Ab: 0.2 s/co ratio (ref 0.0–0.9)

## 2021-10-09 LAB — MITOCHONDRIAL ANTIBODIES: Mitochondrial Ab: 295.1 Units — ABNORMAL HIGH (ref 0.0–20.0)

## 2021-10-09 LAB — ANTI-SMOOTH MUSCLE ANTIBODY, IGG: Smooth Muscle Ab: 20 Units — ABNORMAL HIGH (ref 0–19)

## 2021-10-09 LAB — HEPATITIS A ANTIBODY, TOTAL: hep A Total Ab: NEGATIVE

## 2021-10-09 LAB — HEPATITIS B CORE ANTIBODY, TOTAL: Hep B Core Total Ab: NEGATIVE

## 2021-10-09 LAB — TSH: TSH: 0.828 u[IU]/mL (ref 0.450–4.500)

## 2021-10-09 LAB — HEPATITIS B SURFACE ANTIBODY,QUALITATIVE: Hep B Surface Ab, Qual: NONREACTIVE

## 2021-10-10 ENCOUNTER — Ambulatory Visit
Admission: EM | Admit: 2021-10-10 | Discharge: 2021-10-10 | Disposition: A | Discharge status: Home / Self Care | Payer: Medicare Other | Attending: Emergency Medicine | Admitting: Emergency Medicine

## 2021-10-10 ENCOUNTER — Encounter: Payer: Self-pay | Admitting: Emergency Medicine

## 2021-10-10 ENCOUNTER — Other Ambulatory Visit: Payer: Self-pay

## 2021-10-10 ENCOUNTER — Ambulatory Visit (INDEPENDENT_AMBULATORY_CARE_PROVIDER_SITE_OTHER): Payer: Medicare Other

## 2021-10-10 ENCOUNTER — Telehealth: Payer: Self-pay | Admitting: Gastroenterology

## 2021-10-10 DIAGNOSIS — R350 Frequency of micturition: Secondary | ICD-10-CM | POA: Diagnosis not present

## 2021-10-10 DIAGNOSIS — M25571 Pain in right ankle and joints of right foot: Secondary | ICD-10-CM

## 2021-10-10 DIAGNOSIS — W19XXXA Unspecified fall, initial encounter: Secondary | ICD-10-CM | POA: Insufficient documentation

## 2021-10-10 DIAGNOSIS — Y92009 Unspecified place in unspecified non-institutional (private) residence as the place of occurrence of the external cause: Secondary | ICD-10-CM | POA: Diagnosis not present

## 2021-10-10 DIAGNOSIS — S92101A Unspecified fracture of right talus, initial encounter for closed fracture: Secondary | ICD-10-CM | POA: Diagnosis present

## 2021-10-10 DIAGNOSIS — M25561 Pain in right knee: Secondary | ICD-10-CM

## 2021-10-10 DIAGNOSIS — R81 Glycosuria: Secondary | ICD-10-CM | POA: Insufficient documentation

## 2021-10-10 LAB — URINALYSIS, COMPLETE (UACMP) WITH MICROSCOPIC
Bilirubin Urine: NEGATIVE
Glucose, UA: 100 mg/dL — AB
Hgb urine dipstick: NEGATIVE
Ketones, ur: NEGATIVE mg/dL
Leukocytes,Ua: NEGATIVE
Nitrite: POSITIVE — AB
Protein, ur: NEGATIVE mg/dL
Specific Gravity, Urine: 1.025 (ref 1.005–1.030)
pH: 7 (ref 5.0–8.0)

## 2021-10-10 MED ORDER — CEPHALEXIN 500 MG PO CAPS: 500.0000 mg | ORAL_CAPSULE | Freq: Two times a day (BID) | ORAL | 0 refills | Status: AC

## 2021-10-10 NOTE — Discharge Instructions (Addendum)
Recommend start Keflex 560m twice a day as directed for possible UTI. Continue to push fluids. Will notify you of any change in management pending urine culture results.  Recommend wear right boot for support. No weight bearing for now. Use crutches. May take Tylenol 5045mevery 6 to 8 hours as needed for pain. Recommend call Dr. FoVickki MuffFoot Doctor, today to schedule appointment for follow-up.

## 2021-10-10 NOTE — ED Triage Notes (Signed)
Patient states that she was going down 2 steps in her garage and fell.  Patient states that she landed on her right side.  Patient c/o soreness in her right knee, right ankle.    Patient also wants to be checked for possible UTI.  Patient reports urinary frequency and pressure for the past 3 days.

## 2021-10-10 NOTE — ED Provider Notes (Signed)
[facility]    CSN: [id number] Arrival date & time: 10/10/21  1308      History   Chief Complaint Chief Complaint  Patient presents with   Fall   Knee Pain   Ankle Pain    HPI Amanda Wade is a 70 y.o. female.   70 year old female with concern over injury after falling last night at home. She was walking down 2 steps in her garage with her hands full and missed the step and landed on her right side- mainly on her right knee and ankle/foot. She was able to get up and ambulate but with pain. Has history of arthritis in her right knee and concerned over additional injury. Also noticed she had pain moving her right ankle in a dorsiflexion movement and also concerned over injury. Pain is slightly better today and has taken Tylenol with slight relief. Denies any numbness.  Also has noticed increased urinary frequency and lower abdominal pressure for the past 3 days. Denies any fever, hematuria, back pain or unusual vaginal discharge. Has history of UTI but many years ago. Other chronic health issues include type 2 DM, HTN, hyperlipidemia, chronic kidney disease, and history of breast cancer. Currently on Zestoretic, Lipitor, Metformin, aspirin and Arimidex daily.   The history is provided by the patient.   Past Medical History:  Diagnosis Date   Breast cancer ([place])    left breast   Complication of anesthesia    Headache    Hyperlipidemia    Hypertension    Malignant neoplasm of upper-outer quadrant of left breast in female, estrogen receptor positive ([place]) 05/19/2019   Personal history of chemotherapy    Personal history of radiation therapy    PONV (postoperative nausea and vomiting) 1987   Pre-diabetes    Trigger finger of right hand    and thumb   Vertigo    intermittent especially lying on left side    Patient Active Problem List   Diagnosis Date Noted   Long term current use of aromatase inhibitor 08/07/2021   Elevated LFTs 65/78/4696   Lichen sclerosus  of female genitalia 10/29/2020   Trigger finger of right thumb 06/12/2020   Low grade fever 08/08/2019   Nausea without vomiting 08/08/2019   Goals of care, counseling/discussion 05/21/2019   Malignant neoplasm of upper-outer quadrant of left breast in female, estrogen receptor positive ([place]) 05/19/2019   Primary osteoarthritis of right ankle 11/09/2017   Chronic renal insufficiency, stage 2 (mild) 04/08/2017   Primary osteoarthritis of both knees 10/11/2015   Severe obesity with body mass index (BMI) of 35.0 to 39.9 with comorbidity ([place]) 10/11/2015   High risk medication use 04/08/2015   Hyperlipidemia associated with type 2 diabetes mellitus ([place]) 10/01/2014   Hypertension associated with type 2 diabetes mellitus ([place]) 10/01/2014   Type 2 diabetes mellitus with stage 2 chronic kidney disease, without long-term current use of insulin ([place]) 10/01/2014   History of adenomatous polyp of colon 08/15/2013    Past Surgical History:  Procedure Laterality Date   ABDOMINAL HYSTERECTOMY  2004   complete   BREAST BIOPSY Left 05/05/2019   [facility]- ribbon clip   BREAST BIOPSY Left 05/16/2019   [facility]-DCIS   BREAST BIOPSY Left 05/16/2019   [facility]- X clip   BREAST BIOPSY WITH SENTINEL LYMPH NODE BIOPSY AND NEEDLE LOCALIZATION Left 06/05/2019   Procedure: BREAST BIOPSY WITH SENTINEL LYMPH NODE BIOPSY AND NEEDLE LOCALIZATION, LEFT, BRACKETING WIRE;  Surgeon: Robert Bellow, MD;  Location: [facility]  ORS;  Service: General;  Laterality: Left;   BREAST CYST ASPIRATION Bilateral age 46   neg   BREAST LUMPECTOMY Left 05/2019   positive   CHOLECYSTECTOMY     COLONOSCOPY WITH PROPOFOL N/A 08/02/2018   Procedure: COLONOSCOPY WITH PROPOFOL;  Surgeon: [name], MD;  Location[facility] ENDOSCOPY;  Service: Endoscopy;  Laterality: N/A;   DILATION AND CURETTAGE OF UTERUS     goiter surgery     MASTECTOMY Left 07/10/2019   OOPHORECTOMY     PORTACATH PLACEMENT Left 07/10/2019   Procedure: INSERTION PORT-A-CATH;   Surgeon: Jules Husbands, MD;  Location: [facility] ORS;  Service: General;  Laterality: Left;   SIMPLE MASTECTOMY WITH AXILLARY SENTINEL NODE BIOPSY Left 07/10/2019   Procedure: SIMPLE MASTECTOMY.; LEFT;  Surgeon: Jules Husbands, MD;  Location: [facility] ORS;  Service: General;  Laterality: Left;   THYROIDECTOMY, PARTIAL     TRIGGER FINGER RELEASE     rigt hand thumb and pointer finger    OB History     Gravida  1   Para  1   Term      Preterm      AB      Living  1      SAB      IAB      Ectopic      Multiple      Live Births           Obstetric Comments  Menstrual age: 88  Age 1st Pregnancy: 9            Home Medications    Prior to Admission medications   Medication Sig Start Date End Date Taking? Authorizing Provider  anastrozole (ARIMIDEX) 1 MG tablet TAKE 1 TABLET BY MOUTH EVERY DAY 11/29/20  Yes Sindy Guadeloupe, MD  aspirin EC 81 MG tablet Take 81 mg by mouth daily.   Yes [provider]  atorvastatin (LIPITOR) 10 MG tablet Take 10 mg by mouth daily.    Yes [provider]  Calcium Carbonate-Vitamin D 600-400 MG-UNIT tablet Take 2 tablets by mouth daily.    Yes [provider]  cephALEXin (KEFLEX) 500 MG capsule Take 1 capsule (500 mg total) by mouth 2 (two) times daily for 7 days. 10/10/21 10/17/21 Yes Emoni Yang, Nicholes Stairs, NP  lisinopril-hydrochlorothiazide (PRINZIDE,ZESTORETIC) 20-12.5 MG tablet Take 1 tablet by mouth daily.   Yes [provider]  metFORMIN (GLUCOPHAGE-XR) 750 MG 24 hr tablet Take 1,500 mg by mouth daily. 07/29/21  Yes [provider]  Multiple Vitamin (MULTIVITAMIN WITH MINERALS) TABS tablet Take 1 tablet by mouth daily after breakfast. Centrum Silver   Yes [provider]  acetaminophen (TYLENOL) 500 MG tablet Take 500 mg by mouth every 6 (six) hours as needed (for pain/headaches.).    [provider]    Family History Family History  Problem Relation Age of Onset   Breast cancer  Cousin        mat cousin   Hypertension Mother    Heart attack Father    Prostate cancer Father    Diabetes Father    Colon polyps Father     Social History Social History   Tobacco Use   Smoking status: Never   Smokeless tobacco: Never  Vaping Use   Vaping Use: Never used  Substance Use Topics   Alcohol use: Not Currently   Drug use: No     Allergies   Shellfish allergy and Sulfa antibiotics   Review of  Systems Review of Systems  Constitutional:  Negative for activity change, appetite change, chills, fatigue and fever.  Eyes:  Negative for photophobia and visual disturbance.  Respiratory:  Negative for chest tightness and shortness of breath.   Gastrointestinal:  Positive for abdominal pain (pressure- lower abdominal). Negative for nausea and vomiting.  Genitourinary:  Positive for decreased urine volume and frequency. Negative for difficulty urinating, dysuria, flank pain, hematuria, pelvic pain and vaginal discharge.  Musculoskeletal:  Positive for arthralgias, gait problem and joint swelling. Negative for myalgias.  Skin:  Negative for color change, rash and wound.  Allergic/Immunologic: Positive for food allergies. Negative for environmental allergies.  Neurological:  Negative for dizziness, tremors, seizures, syncope, weakness, light-headedness and numbness.  Hematological:  Negative for adenopathy.    Physical Exam Triage Vital Signs ED Triage Vitals  Enc Vitals Group     BP 10/10/21 0927 129/77     Pulse Rate 10/10/21 0927 82     Resp 10/10/21 0927 14     Temp 10/10/21 0927 98.7 F (37.1 C)     Temp Source 10/10/21 0927 Oral     SpO2 10/10/21 0927 96 %     Weight 10/10/21 0923 200 lb 2.8 oz (90.8 kg)     Height 10/10/21 0923 5' (1.524 m)     Head Circumference --      Peak Flow --      Pain Score 10/10/21 0923 5     Pain Loc --      Pain Edu? --      Excl. in [place]? --    No data found.  Updated Vital Signs BP 129/77 (BP Location: Right Arm)    Pulse 82   Temp 98.7 F (37.1 C) (Oral)   Resp 14   Ht 5' (1.524 m)   Wt 200 lb 2.8 oz (90.8 kg)   SpO2 96%   BMI 39.09 kg/m   Visual Acuity Right Eye Distance:   Left Eye Distance:   Bilateral Distance:    Right Eye Near:   Left Eye Near:    Bilateral Near:     Physical Exam Vitals and nursing note reviewed.  Constitutional:      General: She is awake. She is not in acute distress.    Appearance: She is well-developed and well-groomed.     Comments: She is sitting in the exam chair in no acute distress but appears uncomfortable due to right knee pain and right ankle pain. Used crutches for ambulation into Urgent Care today.   HENT:     Head: Normocephalic and atraumatic.     Right Ear: Hearing normal.     Left Ear: Hearing normal.  Eyes:     Extraocular Movements: Extraocular movements intact.     Conjunctiva/sclera: Conjunctivae normal.     Pupils: Pupils are equal, round, and reactive to light.  Cardiovascular:     Rate and Rhythm: Normal rate and regular rhythm.  Pulmonary:     Effort: Pulmonary effort is normal.  Abdominal:     Tenderness: There is no right CVA tenderness or left CVA tenderness.  Musculoskeletal:        General: Swelling and tenderness present.     Cervical back: Normal range of motion.     Right knee: Swelling (slight) present. No effusion, erythema, ecchymosis or lacerations. Decreased range of motion (with full flexion). Tenderness present. Normal alignment and normal patellar mobility. Normal pulse.     Left knee: Normal.     Right  lower leg: No edema.     Left lower leg: No edema.     Right ankle: Swelling present. No ecchymosis. Tenderness present over the medial malleolus. Decreased range of motion.     Right Achilles Tendon: Normal.     Left ankle: Normal.     Left Achilles Tendon: Normal.       Legs:       Feet:     Comments: Right knee with slight decreased range of motion of flexion. Able to extend fully. Tender around entire knee  anterior and posterior aspect. No bruising or ecchymosis. Slight swelling on lateral aspect of knee. No neuro deficits noted.  Right ankle with slight decreased range of motion, mostly with dorsiflexion. Swelling and tenderness along lateral malleolus to dorsal aspect of foot. No abrasion or laceration. Good pulses and capillary refill. No neuro deficits noted.   Skin:    General: Skin is warm and dry.     Capillary Refill: Capillary refill takes less than 2 seconds.     Findings: No abrasion, bruising, ecchymosis, erythema, laceration, rash or wound.  Neurological:     General: No focal deficit present.     Mental Status: She is alert and oriented to person, place, and time.     Sensory: Sensation is intact. No sensory deficit.  Psychiatric:        Mood and Affect: Mood normal.        Behavior: Behavior normal. Behavior is cooperative.        Thought Content: Thought content normal.        Judgment: Judgment normal.     UC Treatments / Results  Labs (all labs ordered are listed, but only abnormal results are displayed) Labs Reviewed  URINALYSIS, COMPLETE (UACMP) WITH MICROSCOPIC - Abnormal; Notable for the following components:      Result Value   Glucose, UA 100 (*)    Nitrite POSITIVE (*)    Bacteria, UA MANY (*)    All other components within normal limits  URINE CULTURE    EKG   Radiology DG Ankle Complete Right  Result Date: 10/10/2021 CLINICAL DATA:  Trauma, pain EXAM: RIGHT ANKLE - COMPLETE 3+ VIEW COMPARISON:  None. FINDINGS: No recent displaced fracture or dislocation is seen. In the lateral view, possible break in the dorsal cortical margin of talus. Bony spurs seen in the distal tibia and fibula. There is 4 mm smooth marginated calcification adjacent to the tip of medial malleolus suggesting old avulsion. There is soft tissue swelling around the ankle. Small plantar spur is seen in calcaneus. IMPRESSION: There is possible break in the dorsal cortical margin of  anterior talus seen only in the lateral projection. This may suggest undisplaced fracture or residual deformity from previous injury. There are no displaced fracture is seen. Small smooth marginated calcification adjacent to the tip of medial malleolus most likely is old avulsion. Electronically Signed   By: Elmer Picker M.D.   On: 10/10/2021 10:06   DG Knee Complete 4 Views Right  Result Date: 10/10/2021 CLINICAL DATA:  Trauma, fall, pain EXAM: RIGHT KNEE - COMPLETE 4+ VIEW COMPARISON:  None. FINDINGS: No recent fracture or dislocation is seen. There is no significant effusion. Degenerative changes are noted with bony spurs in medial, lateral and patellofemoral compartments. IMPRESSION: No recent fracture or dislocation is seen. Degenerative changes are noted with bony spurs, more so in the medial compartment. Electronically Signed   By: Elmer Picker M.D.   On: 10/10/2021 10:03  Procedures Procedures (including critical care time)  Medications Ordered in UC Medications - No data to display  Initial Impression / Assessment and Plan / UC Course  I have reviewed the triage vital signs and the nursing notes.  Pertinent labs & imaging results that were available during my care of the patient were reviewed by me and considered in my medical decision making (see chart for details).     Reviewed urinalysis results with patient. Positive nitrites, many bacterial and some WBC's seen under microscope. Possible UTI. Will send urine for culture. Discussed that she does have some glucose in her urine which indicates her diabetes is not well-controlled and she may need to add or change her medication. Higher glucose levels can increase incidence of UTI. Will start her on Keflex 545m twice a day as directed. May change or d/c pending urine culture results. Continue to push fluids.   Reviewed right knee x-ray results- confirmed arthritis and bone spurs but no acute injury.  Reviewed right  ankle x-ray results with patient- possible fracture anterior dorsal talus. Multiple bone spurs seen and probable old injury to her medial malleolus. Reviewed Up to Date information regarding possible talus fractures- usually occurring with significant trauma such as snowboarding. Recommend patient be seen by Podiatrist for further evaluation. Due to location of possible fracture and not confirmed, will provide supportive measures for now- no Emergent need at this time but may change pending Podiatry consult. No compromise in vascular or neuro integrity. Recommend wear walking boot for support. No weight bearing for now. Use crutches. May take Tylenol 5059mevery 6 to 8 hours as needed for pain. Patient agreeable to contacting Dr. FoVickki MuffPodiatrist, today to discuss injury and schedule appointment for follow-up.  Final Clinical Impressions(s) / UC Diagnoses   Final diagnoses:  Closed nondisplaced fracture of right talus, unspecified portion of talus, initial encounter  Acute right ankle pain  Acute pain of right knee  Increased urinary frequency  Glucosuria  Fall at home, initial encounter     Discharge Instructions      Recommend start Keflex 50023mwice a day as directed for possible UTI. Continue to push fluids. Will notify you of any change in management pending urine culture results.  Recommend wear right boot for support. No weight bearing for now. Use crutches. May take Tylenol 500m23mery 6 to 8 hours as needed for pain. Recommend call Dr. FowlVickki Muffot Doctor, today to schedule appointment for follow-up.     ED Prescriptions     Medication Sig Dispense Auth. Provider   cephALEXin (KEFLEX) 500 MG capsule Take 1 capsule (500 mg total) by mouth 2 (two) times daily for 7 days. 14 capsule Quentin Strebel Nicholes Stairs      PDMP not reviewed this encounter.   AmyoKaty Apo 10/11/21 [phone]

## 2021-10-10 NOTE — Telephone Encounter (Signed)
Patient returned your call to discuss results.

## 2021-10-13 LAB — URINE CULTURE: Culture: >=100000 — AB

## 2021-10-20 NOTE — Telephone Encounter (Signed)
Called patient to discuss lab results regarding elevated anti-smooth muscle antibodies and antimitochondrial antibodies.  I discussed with her about ultrasound-guided liver biopsy and she is agreeable  Hope  Please schedule ultrasound-guided liver biopsy Dx: Elevated LFTs, elevated AMA and ASMA  Thanks RV

## 2021-10-21 ENCOUNTER — Telehealth: Payer: Self-pay

## 2021-10-21 ENCOUNTER — Other Ambulatory Visit: Payer: Self-pay

## 2021-10-21 DIAGNOSIS — R7989 Other specified abnormal findings of blood chemistry: Secondary | ICD-10-CM

## 2021-10-21 NOTE — Telephone Encounter (Signed)
Called patient to let her know her biopsy was scheduled for  10/30/2021 at 7:30  nurses will call you 2 -3 days before your procedure  patient understands

## 2021-10-21 NOTE — Progress Notes (Signed)
Ordered ultrasound guided liver biopsy  for elevated lfts and elevated ama and asma  As requested by Dr Marius Ditch called central scheduling and sent over fax form to specialty schedulers

## 2021-10-24 NOTE — Progress Notes (Signed)
Patient on schedule for Liver biopsy 10/30/2021, called and spoke with patient on phone with pre procedure instructions given. Made aware to be here @ 0730, NPO after MN prior to procedure/holding am dose of metformin, and driver post procedure/recovery/discharge.stated understanding.

## 2021-10-28 ENCOUNTER — Other Ambulatory Visit: Payer: Self-pay | Admitting: Radiology

## 2021-10-29 ENCOUNTER — Other Ambulatory Visit: Payer: Self-pay | Admitting: Radiology

## 2021-10-30 ENCOUNTER — Other Ambulatory Visit: Payer: Self-pay

## 2021-10-30 ENCOUNTER — Ambulatory Visit
Admission: RE | Admit: 2021-10-30 | Discharge: 2021-10-30 | Disposition: A | Discharge status: Home / Self Care | Payer: Medicare Other | Source: Ambulatory Visit | Attending: Gastroenterology | Admitting: Gastroenterology

## 2021-10-30 DIAGNOSIS — K753 Granulomatous hepatitis, not elsewhere classified: Secondary | ICD-10-CM | POA: Diagnosis not present

## 2021-10-30 DIAGNOSIS — K7400 Hepatic fibrosis, unspecified: Secondary | ICD-10-CM | POA: Diagnosis not present

## 2021-10-30 DIAGNOSIS — K7581 Nonalcoholic steatohepatitis (NASH): Secondary | ICD-10-CM | POA: Diagnosis present

## 2021-10-30 LAB — CBC
HCT: 34.2 % — ABNORMAL LOW (ref 36.0–46.0)
Hemoglobin: 11.8 g/dL — ABNORMAL LOW (ref 12.0–15.0)
MCH: 29.9 pg (ref 26.0–34.0)
MCHC: 34.5 g/dL (ref 30.0–36.0)
MCV: 86.8 fL (ref 80.0–100.0)
Platelets: 303 10*3/uL (ref 150–400)
RBC: 3.94 MIL/uL (ref 3.87–5.11)
RDW: 12.5 % (ref 11.5–15.5)
WBC: 6 10*3/uL (ref 4.0–10.5)
nRBC: 0 % (ref 0.0–0.2)

## 2021-10-30 LAB — PROTIME-INR
INR: 0.9 (ref 0.8–1.2)
Prothrombin Time: 11.7 seconds (ref 11.4–15.2)

## 2021-10-30 LAB — GLUCOSE, CAPILLARY
Glucose-Capillary: 138 mg/dL — ABNORMAL HIGH (ref 70–99)
Glucose-Capillary: 121 mg/dL — ABNORMAL HIGH (ref 70–99)

## 2021-10-30 MED ORDER — SODIUM CHLORIDE 0.9 % IV SOLN: INTRAVENOUS | Status: DC

## 2021-10-30 MED ORDER — MIDAZOLAM HCL 2 MG/2ML IJ SOLN
INTRAMUSCULAR | Status: AC
  Filled 2021-10-30: qty 2

## 2021-10-30 MED ORDER — FENTANYL CITRATE (PF) 100 MCG/2ML IJ SOLN
INTRAMUSCULAR | Status: AC | PRN
  Administered 2021-10-30: 50 ug via INTRAVENOUS
  Administered 2021-10-30: 25 ug via INTRAVENOUS

## 2021-10-30 MED ORDER — MIDAZOLAM HCL 5 MG/5ML IJ SOLN
INTRAMUSCULAR | Status: AC | PRN
  Administered 2021-10-30: .5 mg via INTRAVENOUS
  Administered 2021-10-30: 1 mg via INTRAVENOUS

## 2021-10-30 MED ORDER — FENTANYL CITRATE (PF) 100 MCG/2ML IJ SOLN
INTRAMUSCULAR | Status: AC
  Filled 2021-10-30: qty 2

## 2021-10-30 MED ORDER — HYDROCODONE-ACETAMINOPHEN 5-325 MG PO TABS: 1.0000 | ORAL_TABLET | ORAL | Status: DC | PRN

## 2021-10-30 NOTE — H&P (Signed)
Chief Complaint: Patient was seen in consultation today for abnormal liver labs at the request of Lin Landsman  Referring Physician(s): Lin Landsman  Supervising Physician: Michaelle Birks  Patient Status: [facility] - Out-pt  History of Present Illness: Amanda Wade is a 70 y.o. female with history of metabolic syndrome, breast cancer status post lumpectomy and treatment with elevated LFTs since 04/2020 most likely secondary to fatty liver from metabolic syndrome, now with elevated anti-smooth muscle antibodies and antimitochondrial antibodies.  The patient was seen by GI on 11/15 and request received for image guided non target liver biopsy with moderate sedation.  The patient has had a H&P performed within the last 30 days, all history, medications, and exam have been reviewed. The patient denies any interval changes since the H&P.  The patient denies any current abdominal pain, chest pain, shortness of breath or palpitations. She denies any known bleeding or clotting disorder and denies any recent blood thinner use x atleast 5 days. The patient does admit to new rash on her legs and back that she has had before but seems to be more frequently happening lately, she states she is going to see a dermatologist for this. The patient denies any history of sleep apnea or chronic oxygen use. She has no known complications to sedation.    Past Medical History:  Diagnosis Date   Breast cancer ([place])    left breast   Complication of anesthesia    Headache    Hyperlipidemia    Hypertension    Malignant neoplasm of upper-outer quadrant of left breast in female, estrogen receptor positive ([place]) 05/19/2019   Personal history of chemotherapy    Personal history of radiation therapy    PONV (postoperative nausea and vomiting) 1987   Pre-diabetes    Trigger finger of right hand    and thumb   Vertigo    intermittent especially lying on left side    Past Surgical History:  Procedure  Laterality Date   ABDOMINAL HYSTERECTOMY  2004   complete   BREAST BIOPSY Left 05/05/2019   [facility]- ribbon clip   BREAST BIOPSY Left 05/16/2019   [facility]-DCIS   BREAST BIOPSY Left 05/16/2019   [facility]- [facility] clip   BREAST BIOPSY WITH SENTINEL LYMPH NODE BIOPSY AND NEEDLE LOCALIZATION Left 06/05/2019   Procedure: BREAST BIOPSY WITH SENTINEL LYMPH NODE BIOPSY AND NEEDLE LOCALIZATION, LEFT, BRACKETING WIRE;  Surgeon: Robert Bellow, MD;  Location: [facility] ORS;  Service: General;  Laterality: Left;   BREAST CYST ASPIRATION Bilateral age 35   neg   BREAST LUMPECTOMY Left 05/2019   positive   CHOLECYSTECTOMY     COLONOSCOPY WITH PROPOFOL N/A 08/02/2018   Procedure: COLONOSCOPY WITH PROPOFOL;  Surgeon: [name], MD;  Location: [facility] ENDOSCOPY;  Service: Endoscopy;  Laterality: N/A;   DILATION AND CURETTAGE OF UTERUS     goiter surgery     MASTECTOMY Left 07/10/2019   OOPHORECTOMY     PORTACATH PLACEMENT Left 07/10/2019   Procedure: INSERTION PORT-A-CATH;  Surgeon: Jules Husbands, MD;  Location: [facility] ORS;  Service: General;  Laterality: Left;   SIMPLE MASTECTOMY WITH AXILLARY SENTINEL NODE BIOPSY Left 07/10/2019   Procedure: SIMPLE MASTECTOMY.; LEFT;  Surgeon: Jules Husbands, MD;  Location: [facility] ORS;  Service: General;  Laterality: Left;   THYROIDECTOMY, PARTIAL     TRIGGER FINGER RELEASE     rigt hand thumb and pointer finger    Allergies: Shellfish allergy and Sulfa antibiotics  Medications: Prior to Admission  medications   Medication Sig Start Date End Date Taking? Authorizing Provider  acetaminophen (TYLENOL) 500 MG tablet Take 500 mg by mouth every 6 (six) hours as needed (for pain/headaches.).   Yes [provider]  anastrozole (ARIMIDEX) 1 MG tablet TAKE 1 TABLET BY MOUTH EVERY DAY 11/29/20  Yes Sindy Guadeloupe, MD  aspirin EC 81 MG tablet Take 81 mg by mouth daily.   Yes [provider]  atorvastatin (LIPITOR) 10 MG tablet Take 10 mg by mouth daily.    Yes [provider]  Calcium Carbonate-Vitamin D 600-400 MG-UNIT tablet Take 2 tablets by mouth daily.    Yes [provider]  lisinopril-hydrochlorothiazide (PRINZIDE,ZESTORETIC) 20-12.5 MG tablet Take 1 tablet by mouth daily.   Yes [provider]  metFORMIN (GLUCOPHAGE-XR) 750 MG 24 hr tablet Take 1,500 mg by mouth daily. 07/29/21  Yes [provider]  Multiple Vitamin (MULTIVITAMIN WITH MINERALS) TABS tablet Take 1 tablet by mouth daily after breakfast. Centrum Silver   Yes [provider]  cephALEXin (KEFLEX) 500 MG capsule Take 1 capsule by mouth 2 (two) times daily. Patient not taking: Reported on 10/30/2021 10/10/21   [provider]  metFORMIN (GLUCOPHAGE-XR) 750 MG 24 hr tablet Take by mouth. 05/01/21   [provider]     Family History  Problem Relation Age of Onset   Breast cancer Cousin        mat cousin   Hypertension Mother    Heart attack Father    Prostate cancer Father    Diabetes Father    Colon polyps Father     Social History   Socioeconomic History   Marital status: Divorced    Spouse name: Not on file   Number of children: Not on file   Years of education: Not on file   Highest education level: Not on file  Occupational History   Not on file  Tobacco Use   Smoking status: Never   Smokeless tobacco: Never  Vaping Use   Vaping Use: Never used  Substance and Sexual Activity   Alcohol use: Not Currently   Drug use: No   Sexual activity: Not Currently  Other Topics Concern   Not on file  Social History Narrative   Not on file   Social Determinants of Health   Financial Resource Strain: Not on file  Food Insecurity: Not on file  Transportation Needs: Not on file  Physical Activity: Not on file  Stress: Not on file  Social Connections: Not on file   Review of Systems: A 12 point ROS discussed and pertinent positives are indicated in the HPI above.  All other systems are negative.  Review of  Systems  Vital Signs: BP (!) 134/57   Pulse 74   Temp 98.4 F (36.9 C) (Oral)   Resp 16   Ht 5' 1"  (1.549 m)   Wt 188 lb (85.3 kg)   SpO2 96%   BMI 35.52 kg/m   Physical Exam Constitutional:      Appearance: Normal appearance.  HENT:     Head: Normocephalic and atraumatic.  Cardiovascular:     Rate and Rhythm: Normal rate and regular rhythm.  Pulmonary:     Effort: Pulmonary effort is normal. No respiratory distress.  Abdominal:     General: Abdomen is flat.     Palpations: Abdomen is soft.     Tenderness: There is no abdominal tenderness.  Neurological:     Mental Status: She is alert and  oriented to person, place, and time.    Imaging: DG Ankle Complete Right  Result Date: 10/10/2021 CLINICAL DATA:  Trauma, pain EXAM: RIGHT ANKLE - COMPLETE 3+ VIEW COMPARISON:  None. FINDINGS: No recent displaced fracture or dislocation is seen. In the lateral view, possible break in the dorsal cortical margin of talus. Bony spurs seen in the distal tibia and fibula. There is 4 mm smooth marginated calcification adjacent to the tip of medial malleolus suggesting old avulsion. There is soft tissue swelling around the ankle. Small plantar spur is seen in calcaneus. IMPRESSION: There is possible break in the dorsal cortical margin of anterior talus seen only in the lateral projection. This may suggest undisplaced fracture or residual deformity from previous injury. There are no displaced fracture is seen. Small smooth marginated calcification adjacent to the tip of medial malleolus most likely is old avulsion. Electronically Signed   By: Elmer Picker M.D.   On: 10/10/2021 10:06   DG Knee Complete 4 Views Right  Result Date: 10/10/2021 CLINICAL DATA:  Trauma, fall, pain EXAM: RIGHT KNEE - COMPLETE 4+ VIEW COMPARISON:  None. FINDINGS: No recent fracture or dislocation is seen. There is no significant effusion. Degenerative changes are noted with bony spurs in medial, lateral and  patellofemoral compartments. IMPRESSION: No recent fracture or dislocation is seen. Degenerative changes are noted with bony spurs, more so in the medial compartment. Electronically Signed   By: Elmer Picker M.D.   On: 10/10/2021 10:03    Labs: pending at time of note  CBC: Recent Labs    02/04/21 0858 08/07/21 0901  WBC 7.1 5.7  HGB 12.5 12.5  HCT 35.3* 36.1  PLT 282 240    COAGS: No results for input(s): INR, APTT in the last 8760 hours.  BMP: Recent Labs    02/04/21 0858 08/07/21 0901  NA 137 135  K 4.2 4.1  CL 99 96*  CO2 25 30  GLUCOSE 182* 213*  BUN 14 15  CALCIUM 9.8 9.8  CREATININE 0.87 0.93  GFRNONAA >60 >60    LIVER FUNCTION TESTS: Recent Labs    02/04/21 0858 08/07/21 0901 10/07/21 1416  BILITOT 0.7 0.7 0.3  AST 49* 76* 44*  ALT 43 50* 36*  ALKPHOS 76 80 84  PROT 7.0 7.5 7.1  ALBUMIN 3.8 3.9 4.5    Assessment and Plan: This is a 70 year old female with history of metabolic syndrome, breast cancer status post lumpectomy and treatment with elevated LFTs since 04/2020 most likely secondary to fatty liver from metabolic syndrome, now with elevated anti-smooth muscle antibodies and antimitochondrial antibodies.  The patient was seen by GI on 11/15 and request received for image guided non target liver biopsy with moderate sedation.  The patient has been NPO, no blood thinners taken, labs and vitals have been reviewed.  Risks and benefits of image guided liver biopsy with moderate sedation was discussed with the patient and/or patient's family including, but not limited to bleeding, infection, damage to adjacent structures or low yield requiring additional tests.  All of the questions were answered and there is agreement to proceed.  Consent signed and in chart.    Thank you for this interesting consult.  I greatly enjoyed meeting Amanda Wade and look forward to participating in their care.  A copy of this report was sent to the requesting  provider on this date.  Electronically Signed: Hedy Jacob, PA-C 10/30/2021, 8:01 AM   I spent a total of 15 Minutes  in face to face in clinical consultation, greater than 50% of which was counseling/coordinating care for elevated liver function labs.

## 2021-10-30 NOTE — Progress Notes (Signed)
Patient clinically stable post Liver biopsy per Dr Maryelizabeth Kaufmann, tolerated well. Denies complaints at present. Received Versed 1.5 mg along with Fentanyl 75 mcg IV for procedure. Report given to [place] Rn in specials post procedure.

## 2021-10-30 NOTE — Procedures (Signed)
Vascular and Interventional Radiology Procedure Note  Patient: Amanda Wade DOB: 01/17/1951 Medical Record Number: [id number] Note Date/Time: 10/30/21 9:37 AM   Performing Physician: Michaelle Birks, MD Assistant(s): None  Diagnosis: > LFTs  Procedure:  NON-TARGET LIVER BIOPSY  Anesthesia: Conscious Sedation Complications: None Estimated Blood Loss: Minimal Specimens: Sent for Pathology  Findings:  Successful Ultrasound-guided biopsy of liver. A total of 18G x 3 core samples were obtained. Hemostasis of the tract was achieved using Gelfoam Slurry Embolization.  Plan: Bed rest for 2 hours.  See detailed procedure note with images in PACS. The patient tolerated the procedure well without incident or complication and was returned to Recovery in stable condition.    Michaelle Birks, MD Vascular and Interventional Radiology [facility] Radiology   Pager. [place]

## 2021-11-03 LAB — SURGICAL PATHOLOGY

## 2021-11-05 ENCOUNTER — Telehealth: Payer: Self-pay | Admitting: Gastroenterology

## 2021-11-05 DIAGNOSIS — K7581 Nonalcoholic steatohepatitis (NASH): Secondary | ICD-10-CM

## 2021-11-05 DIAGNOSIS — R7989 Other specified abnormal findings of blood chemistry: Secondary | ICD-10-CM

## 2021-11-05 NOTE — Telephone Encounter (Signed)
Inbound call from pt requesting a call back stating that she needs someone to go over her results. Please advise. Thank you.

## 2021-11-10 ENCOUNTER — Other Ambulatory Visit: Payer: Self-pay

## 2021-11-10 ENCOUNTER — Ambulatory Visit
Admission: RE | Admit: 2021-11-10 | Discharge: 2021-11-10 | Disposition: A | Discharge status: Home / Self Care | Payer: Medicare Other | Source: Ambulatory Visit | Attending: Nurse Practitioner | Admitting: Nurse Practitioner

## 2021-11-10 DIAGNOSIS — R7989 Other specified abnormal findings of blood chemistry: Secondary | ICD-10-CM

## 2021-11-10 DIAGNOSIS — Z170 Estrogen receptor positive status [ER+]: Secondary | ICD-10-CM | POA: Insufficient documentation

## 2021-11-10 DIAGNOSIS — Z79811 Long term (current) use of aromatase inhibitors: Secondary | ICD-10-CM | POA: Diagnosis present

## 2021-11-10 DIAGNOSIS — C50412 Malignant neoplasm of upper-outer quadrant of left female breast: Secondary | ICD-10-CM | POA: Diagnosis present

## 2021-11-10 DIAGNOSIS — K7581 Nonalcoholic steatohepatitis (NASH): Secondary | ICD-10-CM

## 2021-11-10 MED ORDER — URSODIOL 400 MG PO CAPS: 400.0000 mg | ORAL_CAPSULE | Freq: Three times a day (TID) | ORAL | 3 refills | Status: DC

## 2021-11-10 NOTE — Telephone Encounter (Signed)
Called patient to discuss the pathology results from liver biopsy.  Suggestive of severe steatohepatitis.  There is presence of single small portal epithelioid granuloma  DIAGNOSIS:  A. LIVER; ULTRASOUND-GUIDED BIOPSY:  - SEVERE STEATOSIS WITH FEATURES OF STEATOHEPATITIS (NAFLD ACTIVITY  SCORE 6 OF 8).  - PERICELLULAR AND PERIPORTAL FIBROSIS (STAGE 2).  - SINGLE SMALL PORTAL EPITHELIOID GRANULOMA  Patient had worsened AST, ALT and AP the day after her liver biopsy compared to baseline.  Recheck LFTs today Recheck AMA levels QuantiFERON gold Empiric trial of ursodiol 400 mg 3 times daily, weight-based Recheck LFTs in 3 months  Patient expressed understanding of the plan  Cephas Darby, MD [facility], [address]  [place], [place] [address]  Main: [phone]  Fax: [phone] Pager: [phone]

## 2021-11-11 ENCOUNTER — Encounter: Payer: Self-pay | Admitting: Gastroenterology

## 2021-11-11 LAB — HEPATIC FUNCTION PANEL
ALT: 26 IU/L (ref 0–32)
AST: 32 IU/L (ref 0–40)
Albumin: 4.4 g/dL (ref 3.7–4.7)
Alkaline Phosphatase: 97 IU/L (ref 44–121)
Bilirubin Total: 0.3 mg/dL (ref 0.0–1.2)
Bilirubin, Direct: 0.13 mg/dL (ref 0.00–0.40)
Total Protein: 7 g/dL (ref 6.0–8.5)

## 2021-11-11 LAB — MITOCHONDRIAL ANTIBODIES: Mitochondrial Ab: 165.3 Units — ABNORMAL HIGH (ref 0.0–20.0)

## 2021-11-13 LAB — QUANTIFERON-TB GOLD PLUS
QuantiFERON Mitogen Value: >10 IU/mL
QuantiFERON Nil Value: 0.03 IU/mL
QuantiFERON TB1 Ag Value: 0.02 IU/mL
QuantiFERON TB2 Ag Value: 0.02 IU/mL
QuantiFERON-TB Gold Plus: NEGATIVE

## 2021-11-30 ENCOUNTER — Other Ambulatory Visit: Payer: Self-pay | Admitting: Oncology

## 2021-12-01 ENCOUNTER — Encounter
Admission: RE | Disposition: A | Discharge status: Home / Self Care | Payer: Self-pay | Source: Home / Self Care | Attending: Gastroenterology

## 2021-12-01 ENCOUNTER — Ambulatory Visit: Payer: Medicare Other | Admitting: Certified Registered"

## 2021-12-01 ENCOUNTER — Ambulatory Visit
Admission: RE | Admit: 2021-12-01 | Discharge: 2021-12-01 | Disposition: A | Discharge status: Home / Self Care | Payer: Medicare Other | Attending: Gastroenterology | Admitting: Gastroenterology

## 2021-12-01 ENCOUNTER — Encounter: Payer: Self-pay | Admitting: Gastroenterology

## 2021-12-01 DIAGNOSIS — Z860100 Personal history of colon polyps, unspecified: Secondary | ICD-10-CM

## 2021-12-01 DIAGNOSIS — I10 Essential (primary) hypertension: Secondary | ICD-10-CM | POA: Diagnosis not present

## 2021-12-01 DIAGNOSIS — E785 Hyperlipidemia, unspecified: Secondary | ICD-10-CM | POA: Diagnosis not present

## 2021-12-01 DIAGNOSIS — E119 Type 2 diabetes mellitus without complications: Secondary | ICD-10-CM | POA: Diagnosis not present

## 2021-12-01 DIAGNOSIS — M1990 Unspecified osteoarthritis, unspecified site: Secondary | ICD-10-CM | POA: Insufficient documentation

## 2021-12-01 DIAGNOSIS — Z1211 Encounter for screening for malignant neoplasm of colon: Secondary | ICD-10-CM | POA: Diagnosis not present

## 2021-12-01 HISTORY — PX: COLONOSCOPY WITH PROPOFOL: SHX5780

## 2021-12-01 LAB — GLUCOSE, CAPILLARY: Glucose-Capillary: 127 mg/dL — ABNORMAL HIGH (ref 70–99)

## 2021-12-01 SURGERY — COLONOSCOPY WITH PROPOFOL
Anesthesia: General

## 2021-12-01 MED ORDER — PROPOFOL 500 MG/50ML IV EMUL
INTRAVENOUS | Status: DC | PRN
  Administered 2021-12-01: 120 ug/kg/min via INTRAVENOUS

## 2021-12-01 MED ORDER — SODIUM CHLORIDE 0.9 % IV SOLN: INTRAVENOUS | Status: DC

## 2021-12-01 MED ORDER — LIDOCAINE 2% (20 MG/ML) 5 ML SYRINGE
INTRAMUSCULAR | Status: DC | PRN
Start: 2021-12-01 — End: 2021-12-01
  Administered 2021-12-01: 20 mg via INTRAVENOUS

## 2021-12-01 MED ORDER — STERILE WATER FOR IRRIGATION IR SOLN
Status: DC | PRN
  Administered 2021-12-01: 60 mL

## 2021-12-01 MED ORDER — PROPOFOL 10 MG/ML IV BOLUS
INTRAVENOUS | Status: DC | PRN
Start: 2021-12-01 — End: 2021-12-01
  Administered 2021-12-01: 70 mg via INTRAVENOUS
  Administered 2021-12-01: 30 mg via INTRAVENOUS

## 2021-12-01 NOTE — Transfer of Care (Signed)
Immediate Anesthesia Transfer of Care Note  Patient: Amanda Wade  Procedure(s) Performed: COLONOSCOPY WITH PROPOFOL  Patient Location: Endoscopy Unit  Anesthesia Type:General  Level of Consciousness: awake, alert  and oriented  Airway & Oxygen Therapy: Patient Spontanous Breathing  Post-op Assessment: Report given to RN and Post -op Vital signs reviewed and stable  Post vital signs: Reviewed  Last Vitals:  Vitals Value Taken Time  BP 121/97 12/01/21 0927  Temp 36.1 C 12/01/21 0926  Pulse 69 12/01/21 0927  Resp 21 12/01/21 0927  SpO2 98 % 12/01/21 0927  Vitals shown include unvalidated device data.  Last Pain:  Vitals:   12/01/21 0926  TempSrc: Tympanic  PainSc: Asleep         Complications: No notable events documented.

## 2021-12-01 NOTE — Anesthesia Preprocedure Evaluation (Signed)
Anesthesia Evaluation  Patient identified by MRN, date of birth, ID band Patient awake    Reviewed: Allergy & Precautions, NPO status , Patient's Chart, lab work & pertinent test results  History of Anesthesia Complications (+) PONV and history of anesthetic complications  Airway Mallampati: III  TM Distance: <3 FB Neck ROM: full    Dental  (+) Chipped   Pulmonary neg pulmonary ROS, neg shortness of breath,    Pulmonary exam normal        Cardiovascular Exercise Tolerance: Good hypertension, Normal cardiovascular exam     Neuro/Psych  Headaches, negative psych ROS   GI/Hepatic negative GI ROS, Neg liver ROS, neg GERD  ,  Endo/Other  diabetes, Type 2  Renal/GU Renal disease  negative genitourinary   Musculoskeletal  (+) Arthritis ,   Abdominal   Peds  Hematology negative hematology ROS (+)   Anesthesia Other Findings Past Medical History: No date: Breast cancer ([place])     Comment:  left breast No date: Complication of anesthesia No date: Headache No date: Hyperlipidemia No date: Hypertension 05/19/2019: Malignant neoplasm of upper-outer quadrant of left breast  in female, estrogen receptor positive ([place]) No date: Personal history of chemotherapy No date: Personal history of radiation therapy 1987: PONV (postoperative nausea and vomiting) No date: Pre-diabetes No date: Trigger finger of right hand     Comment:  and thumb No date: Vertigo     Comment:  intermittent especially lying on left side  Past Surgical History: 2004: ABDOMINAL HYSTERECTOMY     Comment:  complete 05/05/2019: BREAST BIOPSY; Left     Comment:  [facility]- ribbon clip 05/16/2019: BREAST BIOPSY; Left     Comment:  [facility]-DCIS 05/16/2019: BREAST BIOPSY; Left     Comment:  [facility]- X clip 06/05/2019: BREAST BIOPSY WITH SENTINEL LYMPH NODE BIOPSY AND NEEDLE  LOCALIZATION; Left     Comment:  Procedure: BREAST BIOPSY WITH SENTINEL LYMPH NODE BIOPSY               AND NEEDLE LOCALIZATION, LEFT, BRACKETING WIRE;  Surgeon:              Robert Bellow, MD;  Location: [facility] ORS;  Service:               General;  Laterality: Left; age 1: BREAST CYST ASPIRATION; Bilateral     Comment:  neg 05/2019: BREAST LUMPECTOMY; Left     Comment:  positive No date: CHOLECYSTECTOMY 08/02/2018: COLONOSCOPY WITH PROPOFOL; N/A     Comment:  Procedure: COLONOSCOPY WITH PROPOFOL;  Surgeon:               [name], MD;  Location: [facility] ENDOSCOPY;                Service: Endoscopy;  Laterality: N/A; No date: DILATION AND CURETTAGE OF UTERUS No date: goiter surgery 07/10/2019: MASTECTOMY; Left No date: OOPHORECTOMY 07/10/2019: PORTACATH PLACEMENT; Left     Comment:  Procedure: INSERTION PORT-A-CATH;  Surgeon: Jules Husbands, MD;  Location: [facility] ORS;  Service: General;                Laterality: Left; 07/10/2019: SIMPLE MASTECTOMY WITH AXILLARY SENTINEL NODE BIOPSY; Left     Comment:  Procedure: SIMPLE MASTECTOMY.; LEFT;  Surgeon: Jules Husbands, MD;  Location: [facility] ORS;  Service: General;  Laterality: Left; No date: THYROIDECTOMY, PARTIAL No date: TRIGGER FINGER RELEASE     Comment:  rigt hand thumb and pointer finger     Reproductive/Obstetrics negative OB ROS                             Anesthesia Physical Anesthesia Plan  ASA: 3  Anesthesia Plan: General   Post-op Pain Management:    Induction: Intravenous  PONV Risk Score and Plan: Propofol infusion and TIVA  Airway Management Planned: Natural Airway and Nasal Cannula  Additional Equipment:   Intra-op Plan:   Post-operative Plan:   Informed Consent: I have reviewed the patients History and Physical, chart, labs and discussed the procedure including the risks, benefits and alternatives for the proposed anesthesia with the patient or authorized representative who has indicated his/her understanding and acceptance.      Dental Advisory Given  Plan Discussed with: Anesthesiologist, CRNA and Surgeon  Anesthesia Plan Comments: (Patient consented for risks of anesthesia including but not limited to:  - adverse reactions to medications - risk of airway placement if required - damage to eyes, teeth, lips or other oral mucosa - nerve damage due to positioning  - sore throat or hoarseness - Damage to heart, brain, nerves, lungs, other parts of body or loss of life  Patient voiced understanding.)        Anesthesia Quick Evaluation

## 2021-12-01 NOTE — Op Note (Signed)
[facility] Gastroenterology Patient Name: Amanda Wade Procedure Date: 12/01/2021 8:53 AM MRN: [id number] Account #: [id number] Date of Birth: [birth year] Admit Type: Outpatient Age: 71 Room: [facility] ENDO ROOM 1 Gender: Female Note Status: Finalized Instrument Name: Jasper Riling [id number] Procedure:             Colonoscopy Indications:           Surveillance: Personal history of adenomatous polyps                         on last colonoscopy 3 years ago, Last colonoscopy:                         September 2019 Providers:             Lin Landsman MD, MD Referring MD:          Christena Flake. Raechel Ache, MD (Referring MD) Medicines:             General Anesthesia Complications:         No immediate complications. Estimated blood loss: None. Procedure:             Pre-Anesthesia Assessment:                        - Prior to the procedure, a History and Physical was                         performed, and patient medications and allergies were                         reviewed. The patient is competent. The risks and                         benefits of the procedure and the sedation options and                         risks were discussed with the patient. All questions                         were answered and informed consent was obtained.                         Patient identification and proposed procedure were                         verified by the physician, the nurse, the                         anesthesiologist, the anesthetist and the technician                         in the pre-procedure area in the procedure room in the                         endoscopy suite. Mental Status Examination: alert and                         oriented. Airway Examination: normal oropharyngeal  airway and neck mobility. Respiratory Examination:                         clear to auscultation. CV Examination: normal.                         Prophylactic Antibiotics: The  patient does not require                         prophylactic antibiotics. Prior Anticoagulants: The                         patient has taken no previous anticoagulant or                         antiplatelet agents. ASA Grade Assessment: III - A                         patient with severe systemic disease. After reviewing                         the risks and benefits, the patient was deemed in                         satisfactory condition to undergo the procedure. The                         anesthesia plan was to use general anesthesia.                         Immediately prior to administration of medications,                         the patient was re-assessed for adequacy to receive                         sedatives. The heart rate, respiratory rate, oxygen                         saturations, blood pressure, adequacy of pulmonary                         ventilation, and response to care were monitored                         throughout the procedure. The physical status of the                         patient was re-assessed after the procedure.                        After obtaining informed consent, the colonoscope was                         passed under direct vision. Throughout the procedure,                         the patient's blood pressure, pulse, and oxygen  saturations were monitored continuously. The                         Colonoscope was introduced through the anus and                         advanced to the the cecum, identified by appendiceal                         orifice and ileocecal valve. The colonoscopy was                         performed without difficulty. The patient tolerated                         the procedure well. The quality of the bowel                         preparation was evaluated using the BBPS [facility] Bowel                         Preparation Scale) with scores of: Right Colon = 3,                         Transverse  Colon = 3 and Left Colon = 3 (entire mucosa                         seen well with no residual staining, small fragments                         of stool or opaque liquid). The total BBPS score                         equals 9. Findings:      The perianal and digital rectal examinations were normal. Pertinent       negatives include normal sphincter tone and no palpable rectal lesions.      The entire examined colon appeared normal.      The retroflexed view of the distal rectum and anal verge was normal and       showed no anal or rectal abnormalities. Impression:            - The entire examined colon is normal.                        - The distal rectum and anal verge are normal on                         retroflexion view.                        - No specimens collected. Recommendation:        - Discharge patient to home (with escort).                        - Resume previous diet today.                        - Continue present medications.                        -  Repeat colonoscopy in 10 years for screening                         purposes. Procedure Code(s):     --- Professional ---                        K0254, Colorectal cancer screening; colonoscopy on                         individual at high risk Diagnosis Code(s):     --- Professional ---                        Z86.010, Personal history of colonic polyps CPT copyright 2019 American Medical Association. All rights reserved. The codes documented in this report are preliminary and upon coder review may  be revised to meet current compliance requirements. Dr. Ulyess Mort Lin Landsman MD, MD 12/01/2021 9:23:18 AM This report has been signed electronically. Number of Addenda: 0 Note Initiated On: 12/01/2021 8:53 AM Scope Withdrawal Time: 0 hours 6 minutes 32 seconds  Total Procedure Duration: 0 hours 10 minutes 20 seconds  Estimated Blood Loss:  Estimated blood loss: none.      [facility]

## 2021-12-01 NOTE — Anesthesia Postprocedure Evaluation (Signed)
Anesthesia Post Note  Patient: Amanda Wade  Procedure(s) Performed: COLONOSCOPY WITH PROPOFOL  Patient location during evaluation: Endoscopy Anesthesia Type: General Level of consciousness: awake and alert Pain management: pain level controlled Vital Signs Assessment: post-procedure vital signs reviewed and stable Respiratory status: spontaneous breathing, nonlabored ventilation, respiratory function stable and patient connected to nasal cannula oxygen Cardiovascular status: blood pressure returned to baseline and stable Postop Assessment: no apparent nausea or vomiting Anesthetic complications: no   No notable events documented.   Last Vitals:  Vitals:   12/01/21 0946 12/01/21 0956  BP: (!) 94/58 112/78  Pulse: 63 63  Resp: 15 18  Temp:    SpO2: 99% 99%    Last Pain:  Vitals:   12/01/21 0926  TempSrc: Tympanic  PainSc: [place]

## 2021-12-01 NOTE — H&P (Signed)
Cephas Darby, MD [address]  [place]  [place], [place] [address]  Main: [phone]  Fax: [phone] Pager: [phone]  Primary Care Physician:  Leonel Ramsay, MD Primary Gastroenterologist:  Dr. Cephas Darby  Pre-Procedure History & Physical: HPI:  Amanda Wade is a 71 y.o. female is here for an colonoscopy.   Past Medical History:  Diagnosis Date   Breast cancer ([place])    left breast   Complication of anesthesia    Headache    Hyperlipidemia    Hypertension    Malignant neoplasm of upper-outer quadrant of left breast in female, estrogen receptor positive ([place]) 05/19/2019   Personal history of chemotherapy    Personal history of radiation therapy    PONV (postoperative nausea and vomiting) 1987   Pre-diabetes    Trigger finger of right hand    and thumb   Vertigo    intermittent especially lying on left side    Past Surgical History:  Procedure Laterality Date   ABDOMINAL HYSTERECTOMY  2004   complete   BREAST BIOPSY Left 05/05/2019   [facility]- ribbon clip   BREAST BIOPSY Left 05/16/2019   [facility]-DCIS   BREAST BIOPSY Left 05/16/2019   [facility]- X clip   BREAST BIOPSY WITH SENTINEL LYMPH NODE BIOPSY AND NEEDLE LOCALIZATION Left 06/05/2019   Procedure: BREAST BIOPSY WITH SENTINEL LYMPH NODE BIOPSY AND NEEDLE LOCALIZATION, LEFT, BRACKETING WIRE;  Surgeon: Robert Bellow, MD;  Location: [facility] ORS;  Service: General;  Laterality: Left;   BREAST CYST ASPIRATION Bilateral age 34   neg   BREAST LUMPECTOMY Left 05/2019   positive   CHOLECYSTECTOMY     COLONOSCOPY WITH PROPOFOL N/A 08/02/2018   Procedure: COLONOSCOPY WITH PROPOFOL;  Surgeon: [name], MD;  Location: [facility] ENDOSCOPY;  Service: Endoscopy;  Laterality: N/A;   DILATION AND CURETTAGE OF UTERUS     goiter surgery     MASTECTOMY Left 07/10/2019   OOPHORECTOMY     PORTACATH PLACEMENT Left 07/10/2019   Procedure: INSERTION PORT-A-CATH;  Surgeon: Jules Husbands, MD;  Location: [facility] ORS;   Service: General;  Laterality: Left;   SIMPLE MASTECTOMY WITH AXILLARY SENTINEL NODE BIOPSY Left 07/10/2019   Procedure: SIMPLE MASTECTOMY.; LEFT;  Surgeon: Jules Husbands, MD;  Location: [facility] ORS;  Service: General;  Laterality: Left;   THYROIDECTOMY, PARTIAL     TRIGGER FINGER RELEASE     rigt hand thumb and pointer finger    Prior to Admission medications   Medication Sig Start Date End Date Taking? Authorizing Provider  anastrozole (ARIMIDEX) 1 MG tablet TAKE 1 TABLET BY MOUTH EVERY DAY 11/30/21  Yes Sindy Guadeloupe, MD  aspirin EC 81 MG tablet Take 81 mg by mouth daily.   Yes [provider]  atorvastatin (LIPITOR) 10 MG tablet Take 10 mg by mouth daily.    Yes [provider]  Calcium Carbonate-Vitamin D 600-400 MG-UNIT tablet Take 2 tablets by mouth daily.    Yes [provider]  lisinopril-hydrochlorothiazide (PRINZIDE,ZESTORETIC) 20-12.5 MG tablet Take 1 tablet by mouth daily.   Yes [provider]  metFORMIN (GLUCOPHAGE-XR) 750 MG 24 hr tablet Take 1,500 mg by mouth daily. 07/29/21  Yes [provider]  Multiple Vitamin (MULTIVITAMIN WITH MINERALS) TABS tablet Take 1 tablet by mouth daily after breakfast. Centrum Silver   Yes [provider]  acetaminophen (TYLENOL) 500 MG tablet Take 500 mg by mouth every 6 (six) hours as needed (for pain/headaches.).    [provider]  cephALEXin (KEFLEX) 500 MG capsule Take 1 capsule by mouth 2 (two) times daily. Patient not taking: Reported on 10/30/2021 10/10/21   [provider]  metFORMIN (GLUCOPHAGE-XR) 750 MG 24 hr tablet Take by mouth. 05/01/21   [provider]  Ursodiol 400 MG CAPS Take 400 mg by mouth 3 (three) times daily before meals. Patient not taking: Reported on 12/01/2021 11/10/21 12/10/21  Lin Landsman, MD    Allergies as of 10/07/2021 - Review Complete 10/07/2021  Allergen Reaction Noted   Shellfish allergy Rash 10/13/2018   Sulfa antibiotics Rash  07/31/2017    Family History  Problem Relation Age of Onset   Breast cancer Cousin        mat cousin   Hypertension Mother    Heart attack Father    Prostate cancer Father    Diabetes Father    Colon polyps Father     Social History   Socioeconomic History   Marital status: Divorced    Spouse name: Not on file   Number of children: Not on file   Years of education: Not on file   Highest education level: Not on file  Occupational History   Not on file  Tobacco Use   Smoking status: Never   Smokeless tobacco: Never  Vaping Use   Vaping Use: Never used  Substance and Sexual Activity   Alcohol use: Not Currently   Drug use: No   Sexual activity: Not Currently  Other Topics Concern   Not on file  Social History Narrative   Not on file   Social Determinants of Health   Financial Resource Strain: Not on file  Food Insecurity: Not on file  Transportation Needs: Not on file  Physical Activity: Not on file  Stress: Not on file  Social Connections: Not on file  Intimate Partner Violence: Not on file    Review of Systems: See HPI, otherwise negative ROS  Physical Exam: BP (!) 140/49    Pulse 70    Temp (!) 97.2 F (36.2 C) (Temporal)    Resp 16    Ht 5' 5"  (1.651 m)    Wt 83 kg    SpO2 97%    BMI 30.45 kg/m  General:   Alert,  pleasant and cooperative in NAD Head:  Normocephalic and atraumatic. Neck:  Supple; no masses or thyromegaly. Lungs:  Clear throughout to auscultation.    Heart:  Regular rate and rhythm. Abdomen:  Soft, nontender and nondistended. Normal bowel sounds, without guarding, and without rebound.   Neurologic:  Alert and  oriented x4;  grossly normal neurologically.  Impression/Plan: Amanda Wade is here for an colonoscopy to be performed for History of tubular adenomas of the colon  Risks, benefits, limitations, and alternatives regarding  colonoscopy have been reviewed with the patient.  Questions have been answered.  All parties  agreeable.   Sherri Sear, MD  12/01/2021, 8:53 AM

## 2022-01-21 ENCOUNTER — Other Ambulatory Visit: Payer: Self-pay

## 2022-01-21 ENCOUNTER — Encounter: Payer: Self-pay | Admitting: Radiation Oncology

## 2022-01-21 ENCOUNTER — Ambulatory Visit
Admission: RE | Admit: 2022-01-21 | Discharge: 2022-01-21 | Disposition: A | Discharge status: Home / Self Care | Payer: Medicare Other | Source: Ambulatory Visit | Attending: Radiation Oncology | Admitting: Radiation Oncology

## 2022-01-21 VITALS — BP 138/72 | HR 67 | Temp 98.6°F | Resp 16 | Wt 182.9 lb

## 2022-01-21 DIAGNOSIS — Z79811 Long term (current) use of aromatase inhibitors: Secondary | ICD-10-CM | POA: Insufficient documentation

## 2022-01-21 DIAGNOSIS — Z170 Estrogen receptor positive status [ER+]: Secondary | ICD-10-CM | POA: Insufficient documentation

## 2022-01-21 DIAGNOSIS — Z9012 Acquired absence of left breast and nipple: Secondary | ICD-10-CM | POA: Diagnosis not present

## 2022-01-21 DIAGNOSIS — Z923 Personal history of irradiation: Secondary | ICD-10-CM | POA: Insufficient documentation

## 2022-01-21 DIAGNOSIS — C50412 Malignant neoplasm of upper-outer quadrant of left female breast: Secondary | ICD-10-CM | POA: Diagnosis not present

## 2022-01-21 NOTE — Progress Notes (Signed)
Radiation Oncology ?Follow up Note ? ?Name: Amanda Wade   ?Date:   01/21/2022 ?MRN:  [id number] ?DOB: 1951-04-16  ? ? ?This 71 y.o. female presents to the clinic today for 2-year follow-up status post left chest wall and peripheral lymphatic radiation therapy for stage T1b N1 aM0 ER/PR positive invasive mammary carcinoma status post modified radical mastectomy. ? ?REFERRING PROVIDER: Ezequiel Kayser, MD ? ?HPI: Patient is a 71 year old female now at 2 years having completed left chest wall and peripheral lymphatic radiation therapy for stage T1BN1A ER/PR positive invasive mammary carcinoma status post modified radical mastectomy of the left breast.  Seen today in routine follow-up she is doing well.  She specifically denies any chest wall tenderness cough or bone pain..  She had a right mammogram back in June which I have reviewed was BI-RADS 1 negative.  She also recently had some elevated liver enzymes underwent biopsy showing mostly steatosis.  Her enzymes have returned to normal.  She is currently on Arimidex tolerating it well without side effect. ? ?COMPLICATIONS OF TREATMENT: none ? ?FOLLOW UP COMPLIANCE: keeps appointments  ? ?PHYSICAL EXAM:  ?BP 138/72 (BP Location: Right Arm, Patient Position: Sitting)   Pulse 67   Temp 98.6 ?F (37 ?C) (Tympanic)   Resp 16   Wt 182 lb 14.4 oz (83 kg)   BMI 30.44 kg/m?  ?Patient is status post left modified radical mastectomy chest wall is clear without evidence of nodularity or mass.  Right breast is free of dominant mass.  No axillary or supraclavicular adenopathy is noted.  No evidence of lymphedema in her left upper extremity is identified.  Well-developed well-nourished patient in NAD. HEENT reveals PERLA, EOMI, discs not visualized.  Oral cavity is clear. No oral mucosal lesions are identified. Neck is clear without evidence of cervical or supraclavicular adenopathy. Lungs are clear to A&P. Cardiac examination is essentially unremarkable with regular rate and rhythm  without murmur rub or thrill. Abdomen is benign with no organomegaly or masses noted. Motor sensory and DTR levels are equal and symmetric in the upper and lower extremities. Cranial nerves II through XII are grossly intact. Proprioception is intact. No peripheral adenopathy or edema is identified. No motor or sensory levels are noted. Crude visual fields are within normal range. ? ?RADIOLOGY RESULTS: Mammograms reviewed compatible with above-stated findings ? ?PLAN: Present time patient continues to do well 2 years out with no evidence of disease.  I am pleased with her overall progress.  I have asked to see her back in 1 year for follow-up.  Patient is to call with any concerns.  She continues on Arimidex without side effects. ? ?I would like to take this opportunity to thank you for allowing me to participate in the care of your patient.. ?  ? [name], MD ? ?

## 2022-01-28 ENCOUNTER — Other Ambulatory Visit: Payer: Self-pay | Admitting: Obstetrics and Gynecology

## 2022-01-28 DIAGNOSIS — Z1231 Encounter for screening mammogram for malignant neoplasm of breast: Secondary | ICD-10-CM

## 2022-02-04 ENCOUNTER — Ambulatory Visit: Payer: Medicare Other | Admitting: Oncology

## 2022-02-04 ENCOUNTER — Other Ambulatory Visit: Payer: Medicare Other

## 2022-02-04 ENCOUNTER — Ambulatory Visit: Payer: Medicare Other

## 2022-02-18 ENCOUNTER — Encounter: Payer: Self-pay | Admitting: Oncology

## 2022-02-18 ENCOUNTER — Inpatient Hospital Stay (HOSPITAL_BASED_OUTPATIENT_CLINIC_OR_DEPARTMENT_OTHER): Payer: Medicare Other | Admitting: Oncology

## 2022-02-18 ENCOUNTER — Inpatient Hospital Stay: Payer: Medicare Other | Attending: Oncology

## 2022-02-18 ENCOUNTER — Other Ambulatory Visit: Payer: Self-pay

## 2022-02-18 ENCOUNTER — Inpatient Hospital Stay: Payer: Medicare Other

## 2022-02-18 VITALS — BP 132/64 | HR 73 | Temp 96.8°F | Resp 16 | Wt 182.4 lb

## 2022-02-18 DIAGNOSIS — Z5181 Encounter for therapeutic drug level monitoring: Secondary | ICD-10-CM | POA: Diagnosis not present

## 2022-02-18 DIAGNOSIS — Z923 Personal history of irradiation: Secondary | ICD-10-CM | POA: Diagnosis not present

## 2022-02-18 DIAGNOSIS — Z79811 Long term (current) use of aromatase inhibitors: Secondary | ICD-10-CM | POA: Diagnosis not present

## 2022-02-18 DIAGNOSIS — Z853 Personal history of malignant neoplasm of breast: Secondary | ICD-10-CM

## 2022-02-18 DIAGNOSIS — Z803 Family history of malignant neoplasm of breast: Secondary | ICD-10-CM | POA: Diagnosis not present

## 2022-02-18 DIAGNOSIS — Z7983 Long term (current) use of bisphosphonates: Secondary | ICD-10-CM

## 2022-02-18 DIAGNOSIS — Z08 Encounter for follow-up examination after completed treatment for malignant neoplasm: Secondary | ICD-10-CM

## 2022-02-18 DIAGNOSIS — Z7982 Long term (current) use of aspirin: Secondary | ICD-10-CM | POA: Insufficient documentation

## 2022-02-18 DIAGNOSIS — Z170 Estrogen receptor positive status [ER+]: Secondary | ICD-10-CM | POA: Insufficient documentation

## 2022-02-18 DIAGNOSIS — Z9221 Personal history of antineoplastic chemotherapy: Secondary | ICD-10-CM | POA: Insufficient documentation

## 2022-02-18 DIAGNOSIS — Z7984 Long term (current) use of oral hypoglycemic drugs: Secondary | ICD-10-CM | POA: Diagnosis not present

## 2022-02-18 DIAGNOSIS — Z79899 Other long term (current) drug therapy: Secondary | ICD-10-CM | POA: Diagnosis not present

## 2022-02-18 DIAGNOSIS — Z8042 Family history of malignant neoplasm of prostate: Secondary | ICD-10-CM | POA: Insufficient documentation

## 2022-02-18 DIAGNOSIS — C50412 Malignant neoplasm of upper-outer quadrant of left female breast: Secondary | ICD-10-CM | POA: Insufficient documentation

## 2022-02-18 LAB — CBC WITH DIFFERENTIAL/PLATELET
Abs Immature Granulocytes: 0.04 10*3/uL (ref 0.00–0.07)
Basophils Absolute: 0.1 10*3/uL (ref 0.0–0.1)
Basophils Relative: 1 %
Eosinophils Absolute: 0.2 10*3/uL (ref 0.0–0.5)
Eosinophils Relative: 3 %
HCT: 35.5 % — ABNORMAL LOW (ref 36.0–46.0)
Hemoglobin: 12.2 g/dL (ref 12.0–15.0)
Immature Granulocytes: 1 %
Lymphocytes Relative: 18 %
Lymphs Abs: 1.3 10*3/uL (ref 0.7–4.0)
MCH: 30 pg (ref 26.0–34.0)
MCHC: 34.4 g/dL (ref 30.0–36.0)
MCV: 87.2 fL (ref 80.0–100.0)
Monocytes Absolute: 0.4 10*3/uL (ref 0.1–1.0)
Monocytes Relative: 6 %
Neutro Abs: 5.1 10*3/uL (ref 1.7–7.7)
Neutrophils Relative %: 71 %
Platelets: 299 10*3/uL (ref 150–400)
RBC: 4.07 MIL/uL (ref 3.87–5.11)
RDW: 13 % (ref 11.5–15.5)
WBC: 7.1 10*3/uL (ref 4.0–10.5)
nRBC: 0 % (ref 0.0–0.2)

## 2022-02-18 LAB — COMPREHENSIVE METABOLIC PANEL
ALT: 20 U/L (ref 0–44)
AST: 26 U/L (ref 15–41)
Albumin: 4 g/dL (ref 3.5–5.0)
Alkaline Phosphatase: 69 U/L (ref 38–126)
Anion gap: 7 (ref 5–15)
BUN: 16 mg/dL (ref 8–23)
CO2: 30 mmol/L (ref 22–32)
Calcium: 9.9 mg/dL (ref 8.9–10.3)
Chloride: 99 mmol/L (ref 98–111)
Creatinine, Ser: 0.73 mg/dL (ref 0.44–1.00)
GFR, Estimated: >60 mL/min (ref >60)
Glucose, Bld: 126 mg/dL — ABNORMAL HIGH (ref 70–99)
Potassium: 3.6 mmol/L (ref 3.5–5.1)
Sodium: 136 mmol/L (ref 135–145)
Total Bilirubin: 0.2 mg/dL — ABNORMAL LOW (ref 0.3–1.2)
Total Protein: 7.4 g/dL (ref 6.5–8.1)

## 2022-02-18 MED ORDER — ZOLEDRONIC ACID 4 MG/100ML IV SOLN
4.0000 mg | INTRAVENOUS | Status: DC
  Administered 2022-02-18: 4 mg via INTRAVENOUS
  Filled 2022-02-18: qty 100

## 2022-02-18 MED ORDER — SODIUM CHLORIDE 0.9 % IV SOLN
Freq: Once | INTRAVENOUS | Status: AC
  Filled 2022-02-18: qty 250

## 2022-02-18 NOTE — Progress Notes (Signed)
? ? ? ?Hematology/Oncology Consult note ?[place]  ?[phone] [phone] ? ?Patient Care Team: ?Leonel Ramsay, MD as PCP - General (Infectious Diseases) ?Sindy Guadeloupe, MD as Consulting Physician (Oncology) ?Robert Bellow, MD as Consulting Physician (General Surgery) ?Rico Junker, RN as Oncology Nurse Navigator ?[name], MD as Referring Physician (Radiation Oncology)  ? ?Name of the patient: Amanda Wade  ?[id number]  ?1951-06-07  ? ?Date of visit: 02/18/22 ? ?Diagnosis-  pathological prognostic stage IA mpT1b mpN1a Invasive mammary carcinoma of the left breast status post lumpectomy and sentinel lymph node biopsy ?  ? ?Chief complaint/ Reason for visit-routine follow-up of breast cancer to receive Zometa ? ?Heme/Onc history: Patient is a 71 year old postmenopausal female with a past medical history significant for hypertension hyperlipidemia among other medical problems.  She recently underwent a screening mammogram on 04/28/2019 which showed possible mass in the left breast.  This was followed by diagnostic mammogram and ultrasound which showed bilobar hypo-Echoic mass at the 2:30 position of the left breast measuring 0.5 x 0.6 x 1.1 cm.  Hypoechoic mass at the 2 o'clock position as well.  And another bilobed irregular hypoechoic mass at the 3 o'clock position measuring 4 x 5 x 6 mm.  Patient had biopsy of all these 3 masses.  The 3 o'clock position breast mass came back as invasive mammary carcinoma 6 mm, grade 1 strongly ER PR positive greater than 90% and HER-2/neu negative.  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  230 breast mass also came back as intermediate grade DCIS.  There was also another mass noted in the left breast which was biopsied and was 5 mm grade 1 ER PR positive and HER-2/neu negative. ?  ?Patient was taken for lumpectomy and sentinel lymph node biopsy by Dr. Bary Castilla on 06/05/2019.Final pathology showed  invasive mammary carcinoma grade 1 ER PR positive and HER-2/neu negative at 4 different biopsy sites with extensive DCIS predominantly micropapillary type.  One lymph node was also involved with macro metastatic carcinoma and was negative for extranodal extension ?  ?Patient's case was discussed at tumor board and there was a concern that there may be potentially more areas of abnormality given extensive DCIS and invasive cancer seen in multiple foci in the lumpectomy specimen.  Plan was to proceed with bilateral MRI.Breast MRI showed a large area of clumped discontinued non-mass enhancement in the left upper breast spanning an area of 9.8 cm in AP dimension suspicious for residual DCIS or invasive carcinoma.  Patient therefore proceeded with r left mastectomy which showed small area of residual DCIS but no evidence of invasive malignancy. ?  ?Patient also had a MammaPrint testing done on her lumpectomy specimen which came back as high risk group.  29% chance of recurrence without chemotherapy at 5 years.  Greater than 12% chemo benefit and estimated > 93% survival at 5 years with chemotherapy and hormone therapy ?  ?Patient completed 4 cycles of adjuvant TC chemotherapy on 10/26/2019.  She completed adjuvant radiation treatment.  Patient started taking Arimidex in December 2020 ? ?Interval history-patient was seen by GI for abnormal LFTs and also underwentLiver biopsy which showed severe fatty liver.  She has been working on her weight loss.  Denies any specific breast concerns at this time.  Tolerating Arimidex along with calcium and vitamin D well without any side effects ? ?ECOG PS- 1 ?Pain scale- 0 ? ? ?Review of systems- Review of Systems  ?Constitutional:  Negative  for chills, fever, malaise/fatigue and weight loss.  ?HENT:  Negative for congestion, ear discharge and nosebleeds.   ?Eyes:  Negative for blurred vision.  ?Respiratory:  Negative for cough, hemoptysis, sputum production, shortness of breath and  wheezing.   ?Cardiovascular:  Negative for chest pain, palpitations, orthopnea and claudication.  ?Gastrointestinal:  Negative for abdominal pain, blood in stool, constipation, diarrhea, heartburn, melena, nausea and vomiting.  ?Genitourinary:  Negative for dysuria, flank pain, frequency, hematuria and urgency.  ?Musculoskeletal:  Negative for back pain, joint pain and myalgias.  ?Skin:  Negative for rash.  ?Neurological:  Negative for dizziness, tingling, focal weakness, seizures, weakness and headaches.  ?Endo/Heme/Allergies:  Does not bruise/bleed easily.  ?Psychiatric/Behavioral:  Negative for depression and suicidal ideas. The patient does not have insomnia.    ? ? ?Allergies  ?Allergen Reactions  ? Shellfish Allergy Rash  ? Sulfa Antibiotics Rash  ? ? ? ?Past Medical History:  ?Diagnosis Date  ? Breast cancer ([place])   ? left breast  ? Complication of anesthesia   ? Headache   ? Hyperlipidemia   ? Hypertension   ? Malignant neoplasm of upper-outer quadrant of left breast in female, estrogen receptor positive ([place]) 05/19/2019  ? Personal history of chemotherapy   ? Personal history of radiation therapy   ? PONV (postoperative nausea and vomiting) 1987  ? Pre-diabetes   ? Trigger finger of right hand   ? and thumb  ? Vertigo   ? intermittent especially lying on left side  ? ? ? ?Past Surgical History:  ?Procedure Laterality Date  ? ABDOMINAL HYSTERECTOMY  2004  ? complete  ? BREAST BIOPSY Left 05/05/2019  ? [facility]- ribbon clip  ? BREAST BIOPSY Left 05/16/2019  ? [facility]  ? BREAST BIOPSY Left 05/16/2019  ? [facility]- X clip  ? BREAST BIOPSY WITH SENTINEL LYMPH NODE BIOPSY AND NEEDLE LOCALIZATION Left 06/05/2019  ? Procedure: BREAST BIOPSY WITH SENTINEL LYMPH NODE BIOPSY AND NEEDLE LOCALIZATION, LEFT, BRACKETING WIRE;  Surgeon: Robert Bellow, MD;  Location: [facility] ORS;  Service: General;  Laterality: Left;  ? BREAST CYST ASPIRATION Bilateral age 7  ? neg  ? BREAST LUMPECTOMY Left 05/2019  ? positive  ? CHOLECYSTECTOMY    ?  COLONOSCOPY WITH PROPOFOL N/A 08/02/2018  ? Procedure: COLONOSCOPY WITH PROPOFOL;  Surgeon: [name], MD;  Location: [facility] ENDOSCOPY;  Service: Endoscopy;  Laterality: N/A;  ? COLONOSCOPY WITH PROPOFOL N/A 12/01/2021  ? Procedure: COLONOSCOPY WITH PROPOFOL;  Surgeon: Lin Landsman, MD;  Location: [facility] ENDOSCOPY;  Service: Gastroenterology;  Laterality: N/A;  ? DILATION AND CURETTAGE OF UTERUS    ? goiter surgery    ? MASTECTOMY Left 07/10/2019  ? OOPHORECTOMY    ? PORTACATH PLACEMENT Left 07/10/2019  ? Procedure: INSERTION PORT-A-CATH;  Surgeon: Jules Husbands, MD;  Location: [facility] ORS;  Service: General;  Laterality: Left;  ? SIMPLE MASTECTOMY WITH AXILLARY SENTINEL NODE BIOPSY Left 07/10/2019  ? Procedure: SIMPLE MASTECTOMY.; LEFT;  Surgeon: Jules Husbands, MD;  Location: [facility] ORS;  Service: General;  Laterality: Left;  ? THYROIDECTOMY, PARTIAL    ? TRIGGER FINGER RELEASE    ? rigt hand thumb and pointer finger  ? ? ?Social History  ? ?Socioeconomic History  ? Marital status: Divorced  ?  Spouse name: Not on file  ? Number of children: Not on file  ? Years of education: Not on file  ? Highest education level: Not on file  ?Occupational History  ? Not on file  ?  Tobacco Use  ? Smoking status: Never  ? Smokeless tobacco: Never  ?Vaping Use  ? Vaping Use: Never used  ?Substance and Sexual Activity  ? Alcohol use: Not Currently  ? Drug use: No  ? Sexual activity: Not Currently  ?Other Topics Concern  ? Not on file  ?Social History Narrative  ? Not on file  ? ?Social Determinants of Health  ? ?Financial Resource Strain: Not on file  ?Food Insecurity: Not on file  ?Transportation Needs: Not on file  ?Physical Activity: Not on file  ?Stress: Not on file  ?Social Connections: Not on file  ?Intimate Partner Violence: Not on file  ? ? ?Family History  ?Problem Relation Age of Onset  ? Breast cancer Cousin   ?     mat cousin  ? Hypertension Mother   ? Heart attack Father   ? Prostate cancer Father   ? Diabetes Father    ? Colon polyps Father   ? ? ? ?Current Outpatient Medications:  ?  anastrozole (ARIMIDEX) 1 MG tablet, TAKE 1 TABLET BY MOUTH EVERY DAY, Disp: 90 tablet, Rfl: 3 ?  aspirin EC 81 MG tablet, Take 81 mg by mo

## 2022-02-18 NOTE — Patient Instructions (Signed)

## 2022-02-18 NOTE — Progress Notes (Signed)
Pt states that she has been losing weight and will like to know if she is able to slowly come off any medications.  ?

## 2022-02-19 ENCOUNTER — Ambulatory Visit (INDEPENDENT_AMBULATORY_CARE_PROVIDER_SITE_OTHER): Payer: Medicare Other | Admitting: Gastroenterology

## 2022-02-19 ENCOUNTER — Encounter: Payer: Self-pay | Admitting: Gastroenterology

## 2022-02-19 VITALS — BP 149/70 | HR 93 | Temp 98.0°F | Ht 60.0 in | Wt 183.1 lb

## 2022-02-19 DIAGNOSIS — K7581 Nonalcoholic steatohepatitis (NASH): Secondary | ICD-10-CM | POA: Diagnosis not present

## 2022-02-19 DIAGNOSIS — K7400 Hepatic fibrosis, unspecified: Secondary | ICD-10-CM | POA: Diagnosis not present

## 2022-02-19 NOTE — Progress Notes (Signed)
?  ?Cephas Darby, MD ?[address]  ?[address]  ?[place], [place] [address]  ?Main: [phone]  ?Fax: [phone] ? ? ? ?Gastroenterology Consultation ? ?Referring Provider:     No ref. provider found ?Primary Care Physician:  Leonel Ramsay, MD ?Primary Gastroenterologist:  Dr. Cephas Darby ?Reason for Consultation:     Elevated LFTs, history of colon polyps ?      ? HPI:   ?Amanda Wade is a 71 y.o. female referred by Dr. Ola Spurr, Cheral Marker, MD  for consultation & management of elevated LFTs.  Patient has history of breast cancer stage I, ER positive HER2 negative status postlumpectomy, s/p adjuvant chemo and radiation.  Patient is currently maintained on Arimidex with calcium and vitamin D supplementation.  Patient is referred to me for worsening LFTs.  She is first noticed to have mildly elevated AST of 49 in 6/21, have been progressively worsening since then most recently AST 76, ALT 58, normal ALT and phosphatase and T bili.  She underwent ultrasound which revealed fatty liver.  And status postcholecystectomy without any biliary dilatation.  Patient also has history of diabetes, hemoglobin A1c was 8, started on metformin.  Patient has cut back on carbohydrates, started walking and she lost about 10 pounds within last 3 months and lost about 15 pounds within last 6 months.  Her hemoglobin A1c is improving.  Patient does not have any other GI symptoms ? ?Follow-up visit 02/19/2022 ?Patient is here for follow-up of fatty liver.  She is doing remarkably well.  Patient lost about 30 pounds by following healthy diet.  Her last 2 sets of liver enzymes have been normal.  Her hemoglobin A1c is also improving.  She does not have any concerns today ? ?Patient does not smoke or drink alcohol ? ? ?  Latest Ref Rng & Units 02/18/2022  ?  8:51 AM 11/10/2021  ?  2:00 PM 10/07/2021  ?  2:16 PM  ?Hepatic Function  ?Total Protein 6.5 - 8.1 g/dL 7.4   7.0   7.1    ?Albumin 3.5 - 5.0 g/dL 4.0   4.4   4.5    ?AST  15 - 41 U/L 26   32   44    ?ALT 0 - 44 U/L 20   26   36    ?Alk Phosphatase 38 - 126 U/L 69   97   84    ?Total Bilirubin 0.3 - 1.2 mg/dL 0.2   0.3   0.3    ?Bilirubin, Direct 0.00 - 0.40 mg/dL  0.13   0.12    ? ? ?NSAIDs: None ? ?Antiplts/Anticoagulants/Anti thrombotics: None ? ?GI Procedures:  ?Colonoscopy 08/02/2018 ?- Preparation of the colon was fair. ?- Diverticulosis in the sigmoid colon, in the descending colon, in the transverse colon, in the distal transverse ?colon and in the ascending colon. ?- One 3 mm polyp in the transverse colon, removed with a cold biopsy forceps. Resected and retrieved. ?- Two 3 mm polyps at the hepatic flexure, removed with a cold biopsy forceps. Resected and retrieved. ?- The distal rectum and anal verge are normal on retroflexion view. ? ?DIAGNOSIS:  ?A.  COLON POLYP, TRANSVERSE; COLD BIOPSY:  ?- TUBULAR ADENOMA.  ?- NEGATIVE FOR HIGH-GRADE DYSPLASIA AND MALIGNANCY.  ? ?B.  COLON POLYPS X 2, HEPATIC FLEXURE; COLD BIOPSY:  ?- TUBULAR ADENOMAS, 5 FRAGMENTS.  ?- NEGATIVE FOR HIGH-GRADE DYSPLASIA AND MALIGNANCY.  ? ?Past Medical History:  ?Diagnosis Date  ?  Breast cancer ([place])   ? left breast  ? Complication of anesthesia   ? Headache   ? Hyperlipidemia   ? Hypertension   ? Malignant neoplasm of upper-outer quadrant of left breast in female, estrogen receptor positive ([place]) 05/19/2019  ? Personal history of chemotherapy   ? Personal history of radiation therapy   ? PONV (postoperative nausea and vomiting) 1987  ? Pre-diabetes   ? Trigger finger of right hand   ? and thumb  ? Vertigo   ? intermittent especially lying on left side  ? ? ?Past Surgical History:  ?Procedure Laterality Date  ? ABDOMINAL HYSTERECTOMY  2004  ? complete  ? BREAST BIOPSY Left 05/05/2019  ? [facility]- ribbon clip  ? BREAST BIOPSY Left 05/16/2019  ? [facility]-DCIS  ? BREAST BIOPSY Left 05/16/2019  ? [facility]- X clip  ? BREAST BIOPSY WITH SENTINEL LYMPH NODE BIOPSY AND NEEDLE LOCALIZATION Left 06/05/2019  ? Procedure: BREAST BIOPSY  WITH SENTINEL LYMPH NODE BIOPSY AND NEEDLE LOCALIZATION, LEFT, BRACKETING WIRE;  Surgeon: Robert Bellow, MD;  Location: [facility] ORS;  Service: General;  Laterality: Left;  ? BREAST CYST ASPIRATION Bilateral age 89  ? neg  ? BREAST LUMPECTOMY Left 05/2019  ? positive  ? CHOLECYSTECTOMY    ? COLONOSCOPY WITH PROPOFOL N/A 08/02/2018  ? Procedure: COLONOSCOPY WITH PROPOFOL;  Surgeon: [name], MD;  Location: [facility] ENDOSCOPY;  Service: Endoscopy;  Laterality: N/A;  ? COLONOSCOPY WITH PROPOFOL N/A 12/01/2021  ? Procedure: COLONOSCOPY WITH PROPOFOL;  Surgeon: Lin Landsman, MD;  Location: [facility] ENDOSCOPY;  Service: Gastroenterology;  Laterality: N/A;  ? DILATION AND CURETTAGE OF UTERUS    ? goiter surgery    ? MASTECTOMY Left 07/10/2019  ? OOPHORECTOMY    ? PORTACATH PLACEMENT Left 07/10/2019  ? Procedure: INSERTION PORT-A-CATH;  Surgeon: Jules Husbands, MD;  Location: [facility] ORS;  Service: General;  Laterality: Left;  ? SIMPLE MASTECTOMY WITH AXILLARY SENTINEL NODE BIOPSY Left 07/10/2019  ? Procedure: SIMPLE MASTECTOMY.; LEFT;  Surgeon: Jules Husbands, MD;  Location: [facility] ORS;  Service: General;  Laterality: Left;  ? THYROIDECTOMY, PARTIAL    ? TRIGGER FINGER RELEASE    ? rigt hand thumb and pointer finger  ? ? ?Current Outpatient Medications:  ?  acetaminophen (TYLENOL) 500 MG tablet, Take 500 mg by mouth every 6 (six) hours as needed (for pain/headaches.)., Disp: , Rfl:  ?  anastrozole (ARIMIDEX) 1 MG tablet, TAKE 1 TABLET BY MOUTH EVERY DAY, Disp: 90 tablet, Rfl: 3 ?  aspirin EC 81 MG tablet, Take 81 mg by mouth daily., Disp: , Rfl:  ?  atorvastatin (LIPITOR) 10 MG tablet, Take 10 mg by mouth daily. , Disp: , Rfl:  ?  Calcium Carbonate-Vitamin D 600-400 MG-UNIT tablet, Take 2 tablets by mouth daily. , Disp: , Rfl:  ?  lisinopril-hydrochlorothiazide (PRINZIDE,ZESTORETIC) 20-12.5 MG tablet, Take 1 tablet by mouth daily., Disp: , Rfl:  ?  metFORMIN (GLUCOPHAGE-XR) 750 MG 24 hr tablet, Take 1,500 mg by mouth  daily., Disp: , Rfl:  ?  Multiple Vitamin (MULTIVITAMIN WITH MINERALS) TABS tablet, Take 1 tablet by mouth daily after breakfast. Centrum Silver, Disp: , Rfl:  ? ? ? ?Family History  ?Problem Relation Age of Onset  ? Breast cancer Cousin   ?     mat cousin  ? Hypertension Mother   ? Heart attack Father   ? Prostate cancer Father   ? Diabetes Father   ? Colon polyps Father   ?  ? ?  Social History  ? ?Tobacco Use  ? Smoking status: Never  ? Smokeless tobacco: Never  ?Vaping Use  ? Vaping Use: Never used  ?Substance Use Topics  ? Alcohol use: Not Currently  ? Drug use: No  ? ? ?Allergies as of 02/19/2022 - Review Complete 02/19/2022  ?Allergen Reaction Noted  ? Shellfish allergy Rash 10/13/2018  ? Sulfa antibiotics Rash 07/31/2017  ? ? ?Review of Systems:    ?All systems reviewed and negative except where noted in HPI. ? ? Physical Exam:  ?BP (!) 149/70 (BP Location: Left Arm, Patient Position: Sitting, Cuff Size: Normal)   Pulse 93   Temp 98 ?F (36.7 ?C) (Oral)   Ht 5' (1.524 m)   Wt 183 lb 2 oz (83.1 kg)   BMI 35.76 kg/m?  ?No LMP recorded. Patient has had a hysterectomy. ? ?General:   Alert,  Well-developed, well-nourished, pleasant and cooperative in NAD ?Head:  Normocephalic and atraumatic. ?Eyes:  Sclera clear, no icterus.   Conjunctiva pink. ?Ears:  Normal auditory acuity. ?Nose:  No deformity, discharge, or lesions. ?Mouth:  No deformity or lesions,oropharynx pink & moist. ?Neck:  Supple; no masses or thyromegaly. ?Lungs:  Respirations even and unlabored.  Clear throughout to auscultation.   No wheezes, crackles, or rhonchi. No acute distress. ?Heart:  Regular rate and rhythm; no murmurs, clicks, rubs, or gallops. ?Abdomen:  Normal bowel sounds. Soft, obese, non-tender and non-distended without masses, hepatosplenomegaly or hernias noted.  No guarding or rebound tenderness.   ?Rectal: Not performed ?Msk:  Symmetrical without gross deformities. Good, equal movement & strength bilaterally. ?Pulses:  Normal  pulses noted. ?Extremities:  No clubbing or edema.  No cyanosis. ?Neurologic:  Alert and oriented x3;  grossly normal neurologically. ?Skin:  Intact without significant lesions or rashes. No jaundice. ?Psych:

## 2022-05-04 ENCOUNTER — Ambulatory Visit
Admission: RE | Admit: 2022-05-04 | Discharge: 2022-05-04 | Disposition: A | Discharge status: Home / Self Care | Payer: Medicare Other | Source: Ambulatory Visit | Attending: Obstetrics and Gynecology | Admitting: Obstetrics and Gynecology

## 2022-05-04 DIAGNOSIS — Z1231 Encounter for screening mammogram for malignant neoplasm of breast: Secondary | ICD-10-CM | POA: Insufficient documentation

## 2022-06-15 ENCOUNTER — Other Ambulatory Visit: Payer: Self-pay

## 2022-08-04 ENCOUNTER — Encounter: Payer: Self-pay | Admitting: Oncology

## 2022-08-17 ENCOUNTER — Other Ambulatory Visit: Payer: Self-pay

## 2022-08-19 ENCOUNTER — Encounter: Payer: Self-pay | Admitting: Gastroenterology

## 2022-08-19 ENCOUNTER — Ambulatory Visit (INDEPENDENT_AMBULATORY_CARE_PROVIDER_SITE_OTHER): Payer: Medicare Other | Admitting: Gastroenterology

## 2022-08-19 VITALS — BP 146/66 | HR 82 | Temp 98.4°F | Ht 60.0 in | Wt 184.1 lb

## 2022-08-19 DIAGNOSIS — K7581 Nonalcoholic steatohepatitis (NASH): Secondary | ICD-10-CM | POA: Diagnosis not present

## 2022-08-19 NOTE — Progress Notes (Signed)
Cephas Darby, MD [address]  [place]  [place], [place] [address]  Main: [phone]  Fax: [phone]    Gastroenterology Consultation  Referring Provider:     Leonel Ramsay, MD Primary Care Physician:  Leonel Ramsay, MD Primary Gastroenterologist:  Dr. Cephas Darby Reason for Consultation:     Elevated LFTs, history of colon polyps        HPI:   Amanda Wade is a 71 y.o. female referred by Dr. Ola Spurr, Cheral Marker, MD  for consultation & management of elevated LFTs.  Patient has history of breast cancer stage I, ER positive HER2 negative status postlumpectomy, s/p adjuvant chemo and radiation.  Patient is currently maintained on Arimidex with calcium and vitamin D supplementation.  Patient is referred to me for worsening LFTs.  She is first noticed to have mildly elevated AST of 49 in 6/21, have been progressively worsening since then most recently AST 76, ALT 58, normal ALT and phosphatase and T bili.  She underwent ultrasound which revealed fatty liver.  And status postcholecystectomy without any biliary dilatation.  Patient also has history of diabetes, hemoglobin A1c was 8, started on metformin.  Patient has cut back on carbohydrates, started walking and she lost about 10 pounds within last 3 months and lost about 15 pounds within last 6 months.  Her hemoglobin A1c is improving.  Patient does not have any other GI symptoms  Follow-up visit 02/19/2022 Patient is here for follow-up of fatty liver.  She is doing remarkably well.  Patient lost about 30 pounds by following healthy diet.  Her last 2 sets of liver enzymes have been normal.  Her hemoglobin A1c is also improving.  She does not have any concerns today  Follow-up visit 08/19/2022 Patient is here for follow-up of [place] fibrosis.  Patient has lost about 40 pounds since last visit.  She is doing very well.  She is following healthy diet and walking every day.  Her LFTs from 07/23/2022 have come back  normal.  Patient does not smoke or drink alcohol     Latest Ref Rng & Units 02/18/2022    8:51 AM 11/10/2021    2:00 PM 10/07/2021    2:16 PM  Hepatic Function  Total Protein 6.5 - 8.1 g/dL 7.4  7.0  7.1   Albumin 3.5 - 5.0 g/dL 4.0  4.4  4.5   AST 15 - 41 U/L 26  32  44   ALT 0 - 44 U/L 20  26  36   Alk Phosphatase 38 - 126 U/L 69  97  84   Total Bilirubin 0.3 - 1.2 mg/dL 0.2  0.3  0.3   Bilirubin, Direct 0.00 - 0.40 mg/dL  0.13  0.12     NSAIDs: None  Antiplts/Anticoagulants/Anti thrombotics: None  GI Procedures:  Colonoscopy 12/01/2021 normal, adequate prep  Colonoscopy 08/02/2018 - Preparation of the colon was fair. - Diverticulosis in the sigmoid colon, in the descending colon, in the transverse colon, in the distal transverse colon and in the ascending colon. - One 3 mm polyp in the transverse colon, removed with a cold biopsy forceps. Resected and retrieved. - Two 3 mm polyps at the hepatic flexure, removed with a cold biopsy forceps. Resected and retrieved. - The distal rectum and anal verge are normal on retroflexion view.  DIAGNOSIS:  A.  COLON POLYP, TRANSVERSE; COLD BIOPSY:  - TUBULAR ADENOMA.  - NEGATIVE FOR HIGH-GRADE DYSPLASIA AND MALIGNANCY.   B.  COLON POLYPS X 2, HEPATIC FLEXURE; COLD BIOPSY:  - TUBULAR ADENOMAS, 5 FRAGMENTS.  - NEGATIVE FOR HIGH-GRADE DYSPLASIA AND MALIGNANCY.   Past Medical History:  Diagnosis Date   Breast cancer ([place])    left breast   Chronic renal insufficiency, stage 2 (mild) 04/08/2017   EGFR 55 on 04/08/2017, 62 16/94/5038   Complication of anesthesia    Elevated LFTs 08/07/2021   Headache    Hyperlipidemia    Hypertension    Malignant neoplasm of upper-outer quadrant of left breast in female, estrogen receptor positive ([place]) 05/19/2019   Personal history of chemotherapy    Personal history of radiation therapy    PONV (postoperative nausea and vomiting) 1987   Pre-diabetes    Severe obesity with body mass index (BMI) of  35.0 to 39.9 with comorbidity ([place]) 10/11/2015   Trigger finger of right hand    and thumb   Vertigo    intermittent especially lying on left side    Past Surgical History:  Procedure Laterality Date   ABDOMINAL HYSTERECTOMY  2004   complete   BREAST BIOPSY Left 05/05/2019   [facility]- ribbon clip   BREAST BIOPSY Left 05/16/2019   [facility]-DCIS   BREAST BIOPSY Left 05/16/2019   [facility]- X clip   BREAST BIOPSY WITH SENTINEL LYMPH NODE BIOPSY AND NEEDLE LOCALIZATION Left 06/05/2019   Procedure: BREAST BIOPSY WITH SENTINEL LYMPH NODE BIOPSY AND NEEDLE LOCALIZATION, LEFT, BRACKETING WIRE;  Surgeon: Robert Bellow, MD;  Location: [facility] ORS;  Service: General;  Laterality: Left;   BREAST CYST ASPIRATION Bilateral age 11   neg   BREAST LUMPECTOMY Left 05/2019   positive   CHOLECYSTECTOMY     COLONOSCOPY WITH PROPOFOL N/A 08/02/2018   Procedure: COLONOSCOPY WITH PROPOFOL;  Surgeon: [name], MD;  Location: [facility] ENDOSCOPY;  Service: Endoscopy;  Laterality: N/A;   COLONOSCOPY WITH PROPOFOL N/A 12/01/2021   Procedure: COLONOSCOPY WITH PROPOFOL;  Surgeon: Lin Landsman, MD;  Location: [facility] ENDOSCOPY;  Service: Gastroenterology;  Laterality: N/A;   DILATION AND CURETTAGE OF UTERUS     goiter surgery     MASTECTOMY Left 07/10/2019   OOPHORECTOMY     PORTACATH PLACEMENT Left 07/10/2019   Procedure: INSERTION PORT-A-CATH;  Surgeon: Jules Husbands, MD;  Location: [facility] ORS;  Service: General;  Laterality: Left;   SIMPLE MASTECTOMY WITH AXILLARY SENTINEL NODE BIOPSY Left 07/10/2019   Procedure: SIMPLE MASTECTOMY.; LEFT;  Surgeon: Jules Husbands, MD;  Location: [facility] ORS;  Service: General;  Laterality: Left;   THYROIDECTOMY, PARTIAL     TRIGGER FINGER RELEASE     rigt hand thumb and pointer finger    Current Outpatient Medications:    acetaminophen (TYLENOL) 500 MG tablet, Take 500 mg by mouth every 6 (six) hours as needed (for pain/headaches.)., Disp: , Rfl:    anastrozole (ARIMIDEX) 1 MG  tablet, TAKE 1 TABLET BY MOUTH EVERY DAY, Disp: 90 tablet, Rfl: 3   aspirin EC 81 MG tablet, Take 81 mg by mouth daily., Disp: , Rfl:    atorvastatin (LIPITOR) 10 MG tablet, Take 10 mg by mouth daily. , Disp: , Rfl:    Calcium Carbonate-Vitamin D 600-400 MG-UNIT tablet, Take 2 tablets by mouth daily. , Disp: , Rfl:    DUPILUMAB [place], Inject into the skin., Disp: , Rfl:    lisinopril-hydrochlorothiazide (PRINZIDE,ZESTORETIC) 20-12.5 MG tablet, Take 1 tablet by mouth daily., Disp: , Rfl:    metFORMIN (GLUCOPHAGE-XR) 750 MG 24 hr tablet, Take 1,500 mg by mouth daily.,  Disp: , Rfl:    Multiple Vitamin (MULTIVITAMIN WITH MINERALS) TABS tablet, Take 1 tablet by mouth daily after breakfast. Centrum Silver, Disp: , Rfl:    triamcinolone cream (KENALOG) 0.1 %, Apply 1 Application topically 2 (two) times daily., Disp: , Rfl:     Family History  Problem Relation Age of Onset   Breast cancer Cousin        mat cousin   Hypertension Mother    Heart attack Father    Prostate cancer Father    Diabetes Father    Colon polyps Father      Social History   Tobacco Use   Smoking status: Never   Smokeless tobacco: Never  Vaping Use   Vaping Use: Never used  Substance Use Topics   Alcohol use: Not Currently   Drug use: No    Allergies as of 08/19/2022 - Review Complete 08/19/2022  Allergen Reaction Noted   Shellfish allergy Rash 10/13/2018   Sulfa antibiotics Rash 07/31/2017    Review of Systems:    All systems reviewed and negative except where noted in HPI.   Physical Exam:  BP (!) 146/66 (BP Location: Left Arm, Patient Position: Sitting, Cuff Size: Normal)   Pulse 82   Temp 98.4 F (36.9 C) (Oral)   Ht 5' (1.524 m)   Wt 184 lb 2 oz (83.5 kg)   BMI 35.96 kg/m  No LMP recorded. Patient has had a hysterectomy.  General:   Alert,  Well-developed, well-nourished, pleasant and cooperative in NAD Head:  Normocephalic and atraumatic. Eyes:  Sclera clear, no icterus.   Conjunctiva  pink. Ears:  Normal auditory acuity. Nose:  No deformity, discharge, or lesions. Mouth:  No deformity or lesions,oropharynx pink & moist. Neck:  Supple; no masses or thyromegaly. Lungs:  Respirations even and unlabored.  Clear throughout to auscultation.   No wheezes, crackles, or rhonchi. No acute distress. Heart:  Regular rate and rhythm; no murmurs, clicks, rubs, or gallops. Abdomen:  Normal bowel sounds. Soft, obese, non-tender and non-distended without masses, hepatosplenomegaly or hernias noted.  No guarding or rebound tenderness.   Rectal: Not performed Msk:  Symmetrical without gross deformities. Good, equal movement & strength bilaterally. Pulses:  Normal pulses noted. Extremities:  No clubbing or edema.  No cyanosis. Neurologic:  Alert and oriented x3;  grossly normal neurologically. Skin:  Intact without significant lesions or rashes. No jaundice. Psych:  Alert and cooperative. Normal mood and affect.  Imaging Studies: Reviewed  Assessment and Plan:   MARVIS BAKKEN is a 71 y.o. pleasant Caucasian female with history of metabolic syndrome, stage I breast cancer status postlumpectomy, adjuvant chemo and radiation, currently maintained on Arimidex, s/p cholecystectomy is seen in consultation for elevated LFTs, fatty liver   NASH fibrosis: Biopsy-proven: LFTs have been normal since 12/22 secondary liver disease work-up, revealed elevated antimitochondrial antibodies Underwent liver biopsy in 12/22 which revealed severe steatosis with features of steatohepatitis, stage II fibrosis, single small portal epithelioid granuloma.  QuantiFERON gold was negative. continue metformin Recommend to check [place] fibrosis panel Recheck LFTs in 6 months Continue healthy diet and exercise   Follow up in 6 months   Cephas Darby, MD

## 2022-08-21 ENCOUNTER — Inpatient Hospital Stay: Payer: Medicare Other | Attending: Oncology

## 2022-08-21 ENCOUNTER — Encounter: Payer: Self-pay | Admitting: Oncology

## 2022-08-21 ENCOUNTER — Inpatient Hospital Stay: Payer: Medicare Other

## 2022-08-21 ENCOUNTER — Inpatient Hospital Stay (HOSPITAL_BASED_OUTPATIENT_CLINIC_OR_DEPARTMENT_OTHER): Payer: Medicare Other | Admitting: Oncology

## 2022-08-21 VITALS — BP 126/68 | HR 75 | Temp 98.8°F | Resp 18 | Wt 183.9 lb

## 2022-08-21 DIAGNOSIS — Z79811 Long term (current) use of aromatase inhibitors: Secondary | ICD-10-CM | POA: Insufficient documentation

## 2022-08-21 DIAGNOSIS — I129 Hypertensive chronic kidney disease with stage 1 through stage 4 chronic kidney disease, or unspecified chronic kidney disease: Secondary | ICD-10-CM | POA: Insufficient documentation

## 2022-08-21 DIAGNOSIS — N182 Chronic kidney disease, stage 2 (mild): Secondary | ICD-10-CM | POA: Insufficient documentation

## 2022-08-21 DIAGNOSIS — Z9221 Personal history of antineoplastic chemotherapy: Secondary | ICD-10-CM | POA: Insufficient documentation

## 2022-08-21 DIAGNOSIS — Z7982 Long term (current) use of aspirin: Secondary | ICD-10-CM | POA: Diagnosis not present

## 2022-08-21 DIAGNOSIS — C50412 Malignant neoplasm of upper-outer quadrant of left female breast: Secondary | ICD-10-CM | POA: Diagnosis present

## 2022-08-21 DIAGNOSIS — Z923 Personal history of irradiation: Secondary | ICD-10-CM | POA: Insufficient documentation

## 2022-08-21 DIAGNOSIS — Z170 Estrogen receptor positive status [ER+]: Secondary | ICD-10-CM

## 2022-08-21 DIAGNOSIS — Z79899 Other long term (current) drug therapy: Secondary | ICD-10-CM | POA: Insufficient documentation

## 2022-08-21 DIAGNOSIS — E785 Hyperlipidemia, unspecified: Secondary | ICD-10-CM | POA: Insufficient documentation

## 2022-08-21 DIAGNOSIS — Z5181 Encounter for therapeutic drug level monitoring: Secondary | ICD-10-CM | POA: Diagnosis not present

## 2022-08-21 DIAGNOSIS — Z7984 Long term (current) use of oral hypoglycemic drugs: Secondary | ICD-10-CM | POA: Insufficient documentation

## 2022-08-21 DIAGNOSIS — Z7983 Long term (current) use of bisphosphonates: Secondary | ICD-10-CM | POA: Diagnosis not present

## 2022-08-21 DIAGNOSIS — Z08 Encounter for follow-up examination after completed treatment for malignant neoplasm: Secondary | ICD-10-CM

## 2022-08-21 LAB — NASH FIBROSURE(R) PLUS
ALPHA 2-MACROGLOBULINS, QN: 262 mg/dL (ref 110–276)
ALT (SGPT) P5P: 21 IU/L (ref 0–40)
AST (SGOT) P5P: 25 IU/L (ref 0–40)
Apolipoprotein A-1: 192 mg/dL (ref 116–209)
Bilirubin, Total: 0.1 mg/dL (ref 0.0–1.2)
Cholesterol, Total: 170 mg/dL (ref 100–199)
Fibrosis Score: 0.08 (ref 0.00–0.21)
GGT: 35 IU/L (ref 0–60)
Glucose: 119 mg/dL — ABNORMAL HIGH (ref 70–99)
Haptoglobin: 277 mg/dL (ref 42–346)
NASH Score: 0.52 — ABNORMAL HIGH (ref 0.00–0.25)
Steatosis Score: 0.56 — ABNORMAL HIGH (ref 0.00–0.40)
Triglycerides: 190 mg/dL — ABNORMAL HIGH (ref 0–149)

## 2022-08-21 LAB — COMPREHENSIVE METABOLIC PANEL
ALT: 18 U/L (ref 0–44)
AST: 24 U/L (ref 15–41)
Albumin: 3.8 g/dL (ref 3.5–5.0)
Alkaline Phosphatase: 81 U/L (ref 38–126)
Anion gap: 6 (ref 5–15)
BUN: 17 mg/dL (ref 8–23)
CO2: 29 mmol/L (ref 22–32)
Calcium: 9.4 mg/dL (ref 8.9–10.3)
Chloride: 103 mmol/L (ref 98–111)
Creatinine, Ser: 0.87 mg/dL (ref 0.44–1.00)
GFR, Estimated: >60 mL/min (ref >60)
Glucose, Bld: 179 mg/dL — ABNORMAL HIGH (ref 70–99)
Potassium: 3.9 mmol/L (ref 3.5–5.1)
Sodium: 138 mmol/L (ref 135–145)
Total Bilirubin: 0.4 mg/dL (ref 0.3–1.2)
Total Protein: 7.3 g/dL (ref 6.5–8.1)

## 2022-08-21 LAB — CBC WITH DIFFERENTIAL/PLATELET
Abs Immature Granulocytes: 0.05 10*3/uL (ref 0.00–0.07)
Basophils Absolute: 0.1 10*3/uL (ref 0.0–0.1)
Basophils Relative: 1 %
Eosinophils Absolute: 0.3 10*3/uL (ref 0.0–0.5)
Eosinophils Relative: 4 %
HCT: 34.1 % — ABNORMAL LOW (ref 36.0–46.0)
Hemoglobin: 11.8 g/dL — ABNORMAL LOW (ref 12.0–15.0)
Immature Granulocytes: 1 %
Lymphocytes Relative: 21 %
Lymphs Abs: 1.5 10*3/uL (ref 0.7–4.0)
MCH: 29.7 pg (ref 26.0–34.0)
MCHC: 34.6 g/dL (ref 30.0–36.0)
MCV: 85.9 fL (ref 80.0–100.0)
Monocytes Absolute: 0.5 10*3/uL (ref 0.1–1.0)
Monocytes Relative: 7 %
Neutro Abs: 4.6 10*3/uL (ref 1.7–7.7)
Neutrophils Relative %: 66 %
Platelets: 288 10*3/uL (ref 150–400)
RBC: 3.97 MIL/uL (ref 3.87–5.11)
RDW: 13.3 % (ref 11.5–15.5)
WBC: 7 10*3/uL (ref 4.0–10.5)
nRBC: 0 % (ref 0.0–0.2)

## 2022-08-21 MED ORDER — SODIUM CHLORIDE 0.9 % IV SOLN
Freq: Once | INTRAVENOUS | Status: AC
  Filled 2022-08-21: qty 250

## 2022-08-21 MED ORDER — ZOLEDRONIC ACID 4 MG/100ML IV SOLN
4.0000 mg | INTRAVENOUS | Status: DC
  Administered 2022-08-21: 4 mg via INTRAVENOUS
  Filled 2022-08-21: qty 100

## 2022-08-21 NOTE — Progress Notes (Signed)
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  Shey underwent a screening mammogram on 04/28/2019 which showed possible mass in the left breast.  This was followed by diagnostic mammogram and ultrasound which showed bilobar hypo-Echoic mass at the 2:30 position of the left breast measuring 0.5 x 0.6 x 1.1 cm.  Hypoechoic mass at the 2 o'clock position as well.  And another bilobed irregular hypoechoic mass at the 3 o'clock position measuring 4 x 5 x 6 mm.  Patient had biopsy of all these 3 masses.  The 3 o'clock position breast mass came back as invasive mammary carcinoma 6 mm, grade 1 strongly ER PR positive greater than 90% and HER-2/neu negative.  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  230 breast mass also came back as intermediate grade DCIS.  There was also another mass noted in the left breast which was biopsied and was 5 mm grade 1 ER PR positive and HER-2/neu negative.   Patient was taken for lumpectomy and sentinel lymph node biopsy by Dr. Bary Castilla on 06/05/2019.Final pathology showed  invasive mammary carcinoma grade 1 ER PR positive and HER-2/neu negative at 4 different biopsy sites with extensive DCIS predominantly micropapillary type.  One lymph node was also involved with macro metastatic carcinoma and was negative for extranodal extension   Patient's case was discussed at tumor board and there was a concern that there may be potentially more areas of abnormality given extensive DCIS and invasive cancer seen in multiple foci in the lumpectomy specimen.  Plan was to proceed with bilateral MRI.Breast MRI showed a large area of clumped discontinued non-mass enhancement in the left upper breast spanning an area of 9.8 cm in AP dimension suspicious for residual DCIS or invasive carcinoma.  Patient therefore proceeded with r left mastectomy which showed small area of residual DCIS but no evidence of invasive malignancy.   Patient also had a MammaPrint testing done on her lumpectomy specimen which came back as high risk group.  29% chance of recurrence without chemotherapy at 5 years.  Greater than 12% chemo benefit and estimated > 93% survival at 5 years with chemotherapy and hormone therapy   Patient completed 4 cycles of adjuvant TC chemotherapy on 10/26/2019.  She completed adjuvant radiation treatment.  Patient started taking Arimidex in December 2020  Interval history-patient is tolerating anastrozole well without any significant side effects.  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  Patient is working on her intentional weight loss.  ECOG PS- 1 Pain scale- 0   Review of systems- Review of Systems  Constitutional:  Negative for chills, fever, malaise/fatigue and weight loss.  HENT:  Negative for congestion, ear discharge and nosebleeds.   Eyes:  Negative for blurred vision.  Respiratory:  Negative for cough, hemoptysis, sputum production, shortness of breath and wheezing.   Cardiovascular:  Negative for  chest pain, palpitations, orthopnea and claudication.  Gastrointestinal:  Negative for abdominal pain, blood in stool, constipation, diarrhea, heartburn, melena, nausea and vomiting.  Genitourinary:  Negative for dysuria, flank pain, frequency, hematuria and urgency.  Musculoskeletal:  Negative for back pain, joint pain and myalgias.  Skin:  Negative for rash.  Neurological:  Negative for dizziness, tingling, focal weakness, seizures, weakness and headaches.  Endo/Heme/Allergies:  Does not bruise/bleed easily.  Psychiatric/Behavioral:  Negative for depression and suicidal ideas. The patient does not have insomnia.       Allergies  Allergen Reactions   Shellfish Allergy Rash   Sulfa Antibiotics Rash     Past Medical History:  Diagnosis Date   Breast cancer ([place])    left breast   Chronic renal insufficiency, stage 2 (mild) 04/08/2017   EGFR 55 on 04/08/2017, 62 76/54/6503   Complication of anesthesia    Elevated LFTs 08/07/2021   Headache    Hyperlipidemia    Hypertension    Malignant neoplasm of upper-outer quadrant of left breast in female, estrogen receptor positive ([place]) 05/19/2019   Personal history of chemotherapy    Personal history of radiation therapy    PONV (postoperative nausea and vomiting) 1987   Pre-diabetes    Severe obesity with body mass index (BMI) of 35.0 to 39.9 with comorbidity ([place]) 10/11/2015   Trigger finger of right hand    and thumb   Vertigo    intermittent especially lying on left side     Past Surgical History:  Procedure Laterality Date   ABDOMINAL HYSTERECTOMY  2004   complete   BREAST BIOPSY Left 05/05/2019   [facility]- ribbon clip   BREAST BIOPSY Left 05/16/2019   [facility]-DCIS   BREAST BIOPSY Left 05/16/2019   [facility]- X clip   BREAST BIOPSY WITH SENTINEL LYMPH NODE BIOPSY AND NEEDLE LOCALIZATION Left 06/05/2019   Procedure: BREAST BIOPSY WITH SENTINEL LYMPH NODE BIOPSY AND NEEDLE LOCALIZATION, LEFT, BRACKETING WIRE;  Surgeon: Robert Bellow, MD;   Location: [facility] ORS;  Service: General;  Laterality: Left;   BREAST CYST ASPIRATION Bilateral age 71   neg   BREAST LUMPECTOMY Left 05/2019   positive   CHOLECYSTECTOMY     COLONOSCOPY WITH PROPOFOL N/A 08/02/2018   Procedure: COLONOSCOPY WITH PROPOFOL;  Surgeon: [name], MD;  Location: [facility] ENDOSCOPY;  Service: Endoscopy;  Laterality: N/A;   COLONOSCOPY WITH PROPOFOL N/A 12/01/2021   Procedure: COLONOSCOPY WITH PROPOFOL;  Surgeon: Lin Landsman, MD;  Location: [facility] ENDOSCOPY;  Service: Gastroenterology;  Laterality: N/A;   DILATION AND CURETTAGE OF UTERUS     goiter surgery     MASTECTOMY Left 07/10/2019   OOPHORECTOMY     PORTACATH PLACEMENT Left 07/10/2019   Procedure: INSERTION PORT-A-CATH;  Surgeon: Jules Husbands, MD;  Location: [facility] ORS;  Service: General;  Laterality: Left;   SIMPLE MASTECTOMY WITH AXILLARY SENTINEL NODE BIOPSY Left 07/10/2019   Procedure: SIMPLE MASTECTOMY.; LEFT;  Surgeon: Jules Husbands, MD;  Location: [facility] ORS;  Service: General;  Laterality: Left;   THYROIDECTOMY, PARTIAL     TRIGGER FINGER RELEASE     rigt hand thumb and pointer finger    Social History   Socioeconomic History   Marital status: Divorced    Spouse name: Not on file   Number  of children: Not on file   Years of education: Not on file   Highest education level: Not on file  Occupational History   Not on file  Tobacco Use   Smoking status: Never   Smokeless tobacco: Never  Vaping Use   Vaping Use: Never used  Substance and Sexual Activity   Alcohol use: Not Currently   Drug use: No   Sexual activity: Not Currently  Other Topics Concern   Not on file  Social History Narrative   Not on file   Social Determinants of Health   Financial Resource Strain: Not on file  Food Insecurity: Not on file  Transportation Needs: Not on file  Physical Activity: Not on file  Stress: Not on file  Social Connections: Not on file  Intimate Partner Violence: Not on file     Family History  Problem Relation Age of Onset   Breast cancer Cousin        mat cousin   Hypertension Mother    Heart attack Father    Prostate cancer Father    Diabetes Father    Colon polyps Father      Current Outpatient Medications:    acetaminophen (TYLENOL) 500 MG tablet, Take 500 mg by mouth every 6 (six) hours as needed (for pain/headaches.)., Disp: , Rfl:    anastrozole (ARIMIDEX) 1 MG tablet, TAKE 1 TABLET BY MOUTH EVERY DAY, Disp: 90 tablet, Rfl: 3   aspirin EC 81 MG tablet, Take 81 mg by mouth daily., Disp: , Rfl:    atorvastatin (LIPITOR) 10 MG tablet, Take 10 mg by mouth daily. , Disp: , Rfl:    Calcium Carbonate-Vitamin D 600-400 MG-UNIT tablet, Take 2 tablets by mouth daily. , Disp: , Rfl:    DUPILUMAB [place], Inject into the skin., Disp: , Rfl:    lisinopril-hydrochlorothiazide (PRINZIDE,ZESTORETIC) 20-12.5 MG tablet, Take 1 tablet by mouth daily., Disp: , Rfl:    metFORMIN (GLUCOPHAGE-XR) 750 MG 24 hr tablet, Take 1,500 mg by mouth daily., Disp: , Rfl:    Multiple Vitamin (MULTIVITAMIN WITH MINERALS) TABS tablet, Take 1 tablet by mouth daily after breakfast. Centrum Silver, Disp: , Rfl:    triamcinolone cream (KENALOG) 0.1 %, Apply 1 Application topically 2 (two) times daily., Disp: , Rfl:  No current facility-administered medications for this visit.  Facility-Administered Medications Ordered in Other Visits:    Zoledronic Acid (ZOMETA) IVPB 4 mg, 4 mg, Intravenous, Q6 months, Sindy Guadeloupe, MD, Stopped at 08/21/22 1145  Physical exam:  Vitals:   08/21/22 0945  BP: 126/68  Pulse: 75  Resp: 18  Temp: 98.8 F (37.1 C)  SpO2: 98%  Weight: 183 lb 14.4 oz (83.4 kg)   Physical Exam Constitutional:      General: She is not in acute distress. Cardiovascular:     Rate and Rhythm: Normal rate and regular rhythm.     Heart sounds: Normal heart sounds.  Pulmonary:     Effort: Pulmonary effort is normal.     Breath sounds: Normal breath sounds.  Abdominal:      General: Bowel sounds are normal.     Palpations: Abdomen is soft.  Skin:    General: Skin is warm and dry.  Neurological:     Mental Status: She is alert and oriented to person, place, and time.   Breast exam: Patient is s/p left mastectomy without reconstruction.  No evidence of chest wall recurrence.  No palpable masses in the right breast.  No palpable bilateral axillary  adenopathy.    Latest Ref Rng & Units 08/21/2022    9:30 AM  CMP  Glucose 70 - 99 mg/dL 179   BUN 8 - 23 mg/dL 17   Creatinine 0.44 - 1.00 mg/dL 0.87   Sodium 135 - 145 mmol/L 138   Potassium 3.5 - 5.1 mmol/L 3.9   Chloride 98 - 111 mmol/L 103   CO2 22 - 32 mmol/L 29   Calcium 8.9 - 10.3 mg/dL 9.4   Total Protein 6.5 - 8.1 g/dL 7.3   Total Bilirubin 0.3 - 1.2 mg/dL 0.4   Alkaline Phos 38 - 126 U/L 81   AST 15 - 41 U/L 24   ALT 0 - 44 U/L 18       Latest Ref Rng & Units 08/21/2022    9:30 AM  CBC  WBC 4.0 - 10.5 K/uL 7.0   Hemoglobin 12.0 - 15.0 g/dL 11.8   Hematocrit 36.0 - 46.0 % 34.1   Platelets 150 - 400 K/uL 288      Assessment and plan- Patient is a 71 y.o. female with history of stage I left breast cancer ER/PR positive HER2 negative status postlumpectomy adjuvant chemotherapy and radiation presently on Arimidex here for routine follow-up  Clinically patient is doing well with no concerning signs and symptoms of recurrence based on today's exam.  Patient is receiving Zometa every given high risk breast cancer which was started in March 2021 and will not go on until March 2024 which would be her last dose.  Calcium levels okay to proceed with Zometa today.  I will see her back in 6 months with labs and Zometa.Her recent mammogram from June 2023 was unremarkable   Visit Diagnosis 1. Malignant neoplasm of upper-outer quadrant of left breast in female, estrogen receptor positive ([place])   2. Encounter for monitoring zoledronic acid therapy   3. Visit for monitoring Arimidex therapy      Dr. Randa Evens, MD, MPH [facility] at [facility] [phone] 08/21/2022 3:35 PM

## 2022-11-19 ENCOUNTER — Other Ambulatory Visit: Payer: Self-pay | Admitting: Oncology

## 2023-01-27 ENCOUNTER — Ambulatory Visit: Payer: Medicare Other | Admitting: Radiation Oncology

## 2023-02-10 ENCOUNTER — Other Ambulatory Visit: Payer: Self-pay

## 2023-02-14 ENCOUNTER — Ambulatory Visit
Admission: EM | Admit: 2023-02-14 | Discharge: 2023-02-14 | Disposition: A | Discharge status: Home / Self Care | Payer: Medicare Other | Attending: Emergency Medicine | Admitting: Emergency Medicine

## 2023-02-14 ENCOUNTER — Ambulatory Visit (INDEPENDENT_AMBULATORY_CARE_PROVIDER_SITE_OTHER): Payer: Medicare Other

## 2023-02-14 DIAGNOSIS — J189 Pneumonia, unspecified organism: Secondary | ICD-10-CM | POA: Insufficient documentation

## 2023-02-14 LAB — GROUP A STREP BY PCR: Group A Strep by PCR: NOT DETECTED

## 2023-02-14 MED ORDER — PROMETHAZINE-DM 6.25-15 MG/5ML PO SYRP: 5.0000 mL | ORAL_SOLUTION | Freq: Four times a day (QID) | ORAL | 0 refills | Status: DC | PRN

## 2023-02-14 MED ORDER — BENZONATATE 100 MG PO CAPS: 200.0000 mg | ORAL_CAPSULE | Freq: Three times a day (TID) | ORAL | 0 refills | Status: DC

## 2023-02-14 MED ORDER — LEVOFLOXACIN 500 MG PO TABS: 500.0000 mg | ORAL_TABLET | Freq: Every day | ORAL | 0 refills | Status: DC

## 2023-02-14 NOTE — ED Triage Notes (Signed)
Patient presents with cough, sore throat since Wednesday after traveling.  \Pt states she took an at home COVID test at home which was negative.

## 2023-02-14 NOTE — Discharge Instructions (Addendum)
Your chest x-ray shows a pneumonia forming in your right lower lobe.  This will be treated with antibiotics.  Please take the Levaquin 500 milligrams once daily for 7 days for treatment of your pneumonia.  Use the Tessalon Perles every 8 hours during the day as needed for cough.  Take them with a small sip of water.  They may give you numbness to the base of your tongue or metallic taste in her mouth, this is normal.  This is to calm down the cough reflex.  I will prescribe you Promethazine DM cough syrup that she can use at bedtime.  This is an antihistamine it which will make you drowsy but also decrease some of your nasal drainage.  The idea is to help you sleep and control your cough so that your body can rest and heal itself.  Be mindful that does make you drowsy if you have to get up in the middle of the night to use the bathroom.  If you develop any new or worsening symptoms, such as shortness of breath, fever that does not respond to Tylenol and/or ibuprofen, or wheezing, please return for reevaluation or seek care in the ER.

## 2023-02-14 NOTE — ED Provider Notes (Signed)
[facility]    CSN: [id number] Arrival date & time: 02/14/23  [id number]      History   Chief Complaint Chief Complaint  Patient presents with   Cough    HPI Amanda Wade is a 72 y.o. female.   HPI  72 year old female with a past medical history significant for hypertension, hyperlipidemia, vertigo, breast cancer, and prediabetes presenting for evaluation of 4 days worth of respiratory symptoms.  She states her symptoms started when she was in [place] and she thinks it was secondary to the poor air quality.  She has never had a measured fever but she did feel feverish when she was flying home 4 days ago.  Her primary complaint is sore throat and hoarseness.  She has had nasal congestion with very little yellow and bloody discharge.  She is outside a cough that is intermittently productive but she denies shortness of breath or wheezing.  She also denies body aches.  She took a home COVID test that was negative.  Past Medical History:  Diagnosis Date   Breast cancer ([place])    left breast   Chronic renal insufficiency, stage 2 (mild) 04/08/2017   EGFR 55 on 04/08/2017, 60 [id number]   Complication of anesthesia    Elevated LFTs 08/07/2021   Headache    Hyperlipidemia    Hypertension    Malignant neoplasm of upper-outer quadrant of left breast in female, estrogen receptor positive ([place]) 05/19/2019   Personal history of chemotherapy    Personal history of radiation therapy    PONV (postoperative nausea and vomiting) 1987   Pre-diabetes    Severe obesity with body mass index (BMI) of 35.0 to 39.9 with comorbidity ([place]) 10/11/2015   Trigger finger of right hand    and thumb   Vertigo    intermittent especially lying on left side    Patient Active Problem List   Diagnosis Date Noted   Long term current use of aromatase inhibitor [id number]   Lichen sclerosus of female genitalia 10/29/2020   Trigger finger of right thumb 06/12/2020   Goals of care, counseling/discussion  05/21/2019   Malignant neoplasm of upper-outer quadrant of left breast in female, estrogen receptor positive ([place]) 05/19/2019   Primary osteoarthritis of right ankle 11/09/2017   Primary osteoarthritis of both knees 10/11/2015   High risk medication use 04/08/2015   Hyperlipidemia associated with type 2 diabetes mellitus ([place]) 10/01/2014   Hypertension associated with type 2 diabetes mellitus ([place]) 10/01/2014   Type 2 diabetes mellitus with stage 2 chronic kidney disease, without long-term current use of insulin ([place]) 10/01/2014   History of colonic polyps 08/15/2013    Past Surgical History:  Procedure Laterality Date   ABDOMINAL HYSTERECTOMY  2004   complete   BREAST BIOPSY Left 05/05/2019   [facility]- ribbon clip   BREAST BIOPSY Left 05/16/2019   [facility]-DCIS   BREAST BIOPSY Left 05/16/2019   [facility]- X clip   BREAST BIOPSY WITH SENTINEL LYMPH NODE BIOPSY AND NEEDLE LOCALIZATION Left 06/05/2019   Procedure: BREAST BIOPSY WITH SENTINEL LYMPH NODE BIOPSY AND NEEDLE LOCALIZATION, LEFT, BRACKETING WIRE;  Surgeon: Robert Bellow, MD;  Location: [facility] ORS;  Service: General;  Laterality: Left;   BREAST CYST ASPIRATION Bilateral age 23   neg   BREAST LUMPECTOMY Left 05/2019   positive   CHOLECYSTECTOMY     COLONOSCOPY WITH PROPOFOL N/A 08/02/2018   Procedure: COLONOSCOPY WITH PROPOFOL;  Surgeon: [name], MD;  Location: [facility] ENDOSCOPY;  Service: Endoscopy;  Laterality: N/A;   COLONOSCOPY WITH PROPOFOL N/A 12/01/2021   Procedure: COLONOSCOPY WITH PROPOFOL;  Surgeon: Lin Landsman, MD;  Location: [facility] ENDOSCOPY;  Service: Gastroenterology;  Laterality: N/A;   DILATION AND CURETTAGE OF UTERUS     goiter surgery     MASTECTOMY Left 07/10/2019   OOPHORECTOMY     PORTACATH PLACEMENT Left 07/10/2019   Procedure: INSERTION PORT-A-CATH;  Surgeon: Jules Husbands, MD;  Location: [facility] ORS;  Service: General;  Laterality: Left;   SIMPLE MASTECTOMY WITH AXILLARY SENTINEL NODE BIOPSY Left 07/10/2019    Procedure: SIMPLE MASTECTOMY.; LEFT;  Surgeon: Jules Husbands, MD;  Location: [facility] ORS;  Service: General;  Laterality: Left;   THYROIDECTOMY, PARTIAL     TRIGGER FINGER RELEASE     rigt hand thumb and pointer finger    OB History     Gravida  1   Para  1   Term      Preterm      AB      Living  1      SAB      IAB      Ectopic      Multiple      Live Births           Obstetric Comments  Menstrual age: 72  Age 1st Pregnancy: 80            Home Medications    Prior to Admission medications   Medication Sig Start Date End Date Taking? Authorizing Provider  benzonatate (TESSALON) 100 MG capsule Take 2 capsules (200 mg total) by mouth every 8 (eight) hours. 02/14/23  Yes Margarette Canada, NP  levofloxacin (LEVAQUIN) 500 MG tablet Take 1 tablet (500 mg total) by mouth daily. 02/14/23  Yes Margarette Canada, NP  promethazine-dextromethorphan (PROMETHAZINE-DM) 6.25-15 MG/5ML syrup Take 5 mLs by mouth 4 (four) times daily as needed. 02/14/23  Yes Margarette Canada, NP  acetaminophen (TYLENOL) 500 MG tablet Take 500 mg by mouth every 6 (six) hours as needed (for pain/headaches.).    [provider]  anastrozole (ARIMIDEX) 1 MG tablet TAKE 1 TABLET BY MOUTH EVERY DAY 11/19/22   Sindy Guadeloupe, MD  aspirin EC 81 MG tablet Take 81 mg by mouth daily.    [provider]  atorvastatin (LIPITOR) 10 MG tablet Take 10 mg by mouth daily.     [provider]  Calcium Carbonate-Vitamin D 600-400 MG-UNIT tablet Take 2 tablets by mouth daily.     [provider]  DUPILUMAB [place] Inject into the skin.    [provider]  lisinopril-hydrochlorothiazide (PRINZIDE,ZESTORETIC) 20-12.5 MG tablet Take 1 tablet by mouth daily.    [provider]  meloxicam (MOBIC) 7.5 MG tablet Take 7.5 mg by mouth daily. 01/22/23   [provider]  metFORMIN (GLUCOPHAGE-XR) 750 MG 24 hr tablet Take 1,500 mg by mouth daily. 07/29/21   [provider]   Multiple Vitamin (MULTIVITAMIN WITH MINERALS) TABS tablet Take 1 tablet by mouth daily after breakfast. Centrum Silver    [provider]  triamcinolone cream (KENALOG) 0.1 % Apply 1 Application topically 2 (two) times daily. 06/19/22   [provider]    Family History Family History  Problem Relation Age of Onset   Breast cancer Cousin        mat cousin   Hypertension Mother    Heart attack Father    Prostate cancer Father    Diabetes Father    Colon polyps Father  Social History Social History   Tobacco Use   Smoking status: Never   Smokeless tobacco: Never  Vaping Use   Vaping Use: Never used  Substance Use Topics   Alcohol use: Not Currently   Drug use: No     Allergies   Shellfish allergy and Sulfa antibiotics   Review of Systems Review of Systems  Constitutional:  Negative for fever.  HENT:  Positive for congestion, rhinorrhea, sore throat and voice change.   Respiratory:  Positive for cough. Negative for shortness of breath and wheezing.      Physical Exam Triage Vital Signs ED Triage Vitals  Enc Vitals Group     BP 02/14/23 0915 108/67     Pulse Rate 02/14/23 0915 83     Resp 02/14/23 0915 16     Temp 02/14/23 0915 98.4 F (36.9 C)     Temp Source 02/14/23 0915 Oral     SpO2 02/14/23 0915 93 %     Weight 02/14/23 0917 172 lb (78 kg)     Height 02/14/23 0917 5\' 1"  (1.549 m)     Head Circumference --      Peak Flow --      Pain Score 02/14/23 0917 0     Pain Loc --      Pain Edu? --      Excl. in [place]? --    No data found.  Updated Vital Signs BP 108/67 (BP Location: Right Arm)   Pulse 83   Temp 98.4 F (36.9 C) (Oral)   Resp 16   Ht 5\' 1"  (1.549 m)   Wt 172 lb (78 kg)   SpO2 94%   BMI 32.50 kg/m   Visual Acuity Right Eye Distance:   Left Eye Distance:   Bilateral Distance:    Right Eye Near:   Left Eye Near:    Bilateral Near:     Physical Exam Vitals and nursing note reviewed.  Constitutional:       Appearance: Normal appearance. She is not ill-appearing.  HENT:     Head: Normocephalic and atraumatic.     Right Ear: Tympanic membrane, ear canal and external ear normal. There is no impacted cerumen.     Left Ear: Tympanic membrane, ear canal and external ear normal. There is no impacted cerumen.     Nose: Congestion and rhinorrhea present.     Comments: Nasal mucosa is mildly erythematous and edematous with dried yellow discharge.  There is some mixed bloody discharge in the left naris as well.    Mouth/Throat:     Mouth: Mucous membranes are moist.     Pharynx: Oropharynx is clear. Posterior oropharyngeal erythema present. No oropharyngeal exudate.     Comments: Tonsillar pillars demonstrate erythema and mild edema but no exudate.  Posterior oropharynx is also erythematous with clear postnasal drip. Cardiovascular:     Rate and Rhythm: Normal rate and regular rhythm.     Pulses: Normal pulses.     Heart sounds: Normal heart sounds. No murmur heard.    No friction rub. No gallop.  Pulmonary:     Effort: Pulmonary effort is normal.     Breath sounds: Normal breath sounds. No wheezing, rhonchi or rales.  Musculoskeletal:     Cervical back: Normal range of motion and neck supple. No tenderness.  Lymphadenopathy:     Cervical: No cervical adenopathy.  Skin:    General: Skin is warm and dry.     Capillary Refill: Capillary refill takes  less than 2 seconds.  Neurological:     General: No focal deficit present.     Mental Status: She is alert and oriented to person, place, and time.      UC Treatments / Results  Labs (all labs ordered are listed, but only abnormal results are displayed) Labs Reviewed  GROUP A STREP BY PCR    EKG   Radiology DG Chest 2 View  Result Date: 02/14/2023 CLINICAL DATA:  Productive cough for 4 days EXAM: CHEST - 2 VIEW COMPARISON:  07/10/2019 FINDINGS: The heart size and mediastinal contours are within normal limits. Right lower lobe interstitial  opacity. Left lung is clear. No pleural effusion or pneumothorax. The visualized skeletal structures are unremarkable. IMPRESSION: Right lower lobe interstitial opacity, suspicious for pneumonia. Electronically Signed   By: Davina Poke D.O.   On: 02/14/2023 09:58    Procedures Procedures (including critical care time)  Medications Ordered in UC Medications - No data to display  Initial Impression / Assessment and Plan / UC Course  I have reviewed the triage vital signs and the nursing notes.  Pertinent labs & imaging results that were available during my care of the patient were reviewed by me and considered in my medical decision making (see chart for details).   The patient is a pleasant, nontoxic-appearing 40 old female presenting for evaluation of 4 days worth of respiratory symptoms to include sore throat, hoarseness, and nonproductive cough.  She denies shortness breath or wheezing and she is able to speak in full sentences without dyspnea or tachypnea.  Respiratory rate is 16 and her O2 sat on room air is 94%.  She is afebrile at 98.4.  Her exam does reveal inflamed nasal mucosa with yellow discharge in both nares as well as erythema and edema to the posterior oropharynx but no exudate.  Cardiopulmonary exam is free of wheezes, rhonchi, or crackles.  Given the fact the patient reports her cough is productive I will order a chest x-ray since she has had breast cancer, mastectomy, and is on aromatase inhibitor.  I am also can order a strep PCR.  She has taken a home COVID test that was negative.  Strep PCR is negative.  Radiology impression states that there is a right lower lobe interstitial opacity that is suspicious for pneumonia.  I will discharge patient home on Levaquin 500 milligrams daily for 7 days for treatment of pneumonia.   Final Clinical Impressions(s) / UC Diagnoses   Final diagnoses:  Community acquired pneumonia of right lower lobe of lung     Discharge  Instructions      Your chest x-ray shows a pneumonia forming in your right lower lobe.  This will be treated with antibiotics.  Please take the Levaquin 500 milligrams once daily for 7 days for treatment of your pneumonia.  Use the Tessalon [place] every 8 hours during the day as needed for cough.  Take them with a small sip of water.  They may give you numbness to the base of your tongue or metallic taste in her mouth, this is normal.  This is to calm down the cough reflex.  I will prescribe you Promethazine DM cough syrup that she can use at bedtime.  This is an antihistamine it which will make you drowsy but also decrease some of your nasal drainage.  The idea is to help you sleep and control your cough so that your body can rest and heal itself.  Be mindful that does make  you drowsy if you have to get up in the middle of the night to use the bathroom.  If you develop any new or worsening symptoms, such as shortness of breath, fever that does not respond to Tylenol and/or ibuprofen, or wheezing, please return for reevaluation or seek care in the ER.     ED Prescriptions     Medication Sig Dispense Auth. Provider   levofloxacin (LEVAQUIN) 500 MG tablet Take 1 tablet (500 mg total) by mouth daily. 7 tablet Margarette Canada, NP   benzonatate (TESSALON) 100 MG capsule Take 2 capsules (200 mg total) by mouth every 8 (eight) hours. 21 capsule Margarette Canada, NP   promethazine-dextromethorphan (PROMETHAZINE-DM) 6.25-15 MG/5ML syrup Take 5 mLs by mouth 4 (four) times daily as needed. 118 mL Margarette Canada, NP      PDMP not reviewed this encounter.   Margarette Canada, NP 02/14/23 1025

## 2023-02-16 ENCOUNTER — Ambulatory Visit: Payer: Medicare Other | Admitting: Gastroenterology

## 2023-02-16 ENCOUNTER — Encounter: Payer: Self-pay | Admitting: Gastroenterology

## 2023-02-16 ENCOUNTER — Ambulatory Visit (INDEPENDENT_AMBULATORY_CARE_PROVIDER_SITE_OTHER): Payer: Medicare Other | Admitting: Gastroenterology

## 2023-02-16 VITALS — BP 124/73 | HR 92 | Temp 97.8°F | Ht 60.0 in | Wt 180.2 lb

## 2023-02-16 DIAGNOSIS — K7581 Nonalcoholic steatohepatitis (NASH): Secondary | ICD-10-CM

## 2023-02-16 NOTE — Progress Notes (Signed)
Cephas Darby, MD [address]  [place]  [place], [place] [address]  Main: [phone]  Fax: [phone]    Gastroenterology Consultation  Referring Provider:     Leonel Ramsay, MD Primary Care Physician:  Leonel Ramsay, MD Primary Gastroenterologist:  Dr. Cephas Darby Reason for Consultation:     Elevated LFTs, history of colon polyps        HPI:   Amanda Wade is a 72 y.o. female referred by Dr. Ola Spurr, Cheral Marker, MD  for consultation & management of elevated LFTs.  Patient has history of breast cancer stage I, ER positive HER2 negative status postlumpectomy, s/p adjuvant chemo and radiation.  Patient is currently maintained on Arimidex with calcium and vitamin D supplementation.  Patient is referred to me for worsening LFTs.  She is first noticed to have mildly elevated AST of 49 in 6/21, have been progressively worsening since then most recently AST 76, ALT 58, normal ALT and phosphatase and T bili.  She underwent ultrasound which revealed fatty liver.  And status postcholecystectomy without any biliary dilatation.  Patient also has history of diabetes, hemoglobin A1c was 8, started on metformin.  Patient has cut back on carbohydrates, started walking and she lost about 10 pounds within last 3 months and lost about 15 pounds within last 6 months.  Her hemoglobin A1c is improving.  Patient does not have any other GI symptoms  Follow-up visit 02/16/2023 Patient is here for follow-up of fatty liver.  She is doing very well.  She just returned from [place].  She had mild pneumonia for which she received antibiotics.  Recovered well.  Her LFTs from 12/2022 were normal.  She lost about 40 pounds last year and planning to lose more weight this year.  Trying to eat healthy  Patient does not smoke or drink alcohol     Latest Ref Rng & Units 08/21/2022    9:30 AM 02/18/2022    8:51 AM 11/10/2021    2:00 PM  Hepatic Function  Total Protein 6.5 - 8.1 g/dL 7.3  7.4   7.0   Albumin 3.5 - 5.0 g/dL 3.8  4.0  4.4   AST 15 - 41 U/L 24  26  32   ALT 0 - 44 U/L 18  20  26    Alk Phosphatase 38 - 126 U/L 81  69  97   Total Bilirubin 0.3 - 1.2 mg/dL 0.4  0.2  0.3   Bilirubin, Direct 0.00 - 0.40 mg/dL   0.13     NSAIDs: None  Antiplts/Anticoagulants/Anti thrombotics: None  GI Procedures:  Colonoscopy 12/01/2021 normal, adequate prep  Colonoscopy 08/02/2018 - Preparation of the colon was fair. - Diverticulosis in the sigmoid colon, in the descending colon, in the transverse colon, in the distal transverse colon and in the ascending colon. - One 3 mm polyp in the transverse colon, removed with a cold biopsy forceps. Resected and retrieved. - Two 3 mm polyps at the hepatic flexure, removed with a cold biopsy forceps. Resected and retrieved. - The distal rectum and anal verge are normal on retroflexion view.  DIAGNOSIS:  A.  COLON POLYP, TRANSVERSE; COLD BIOPSY:  - TUBULAR ADENOMA.  - NEGATIVE FOR HIGH-GRADE DYSPLASIA AND MALIGNANCY.   B.  COLON POLYPS X 2, HEPATIC FLEXURE; COLD BIOPSY:  - TUBULAR ADENOMAS, 5 FRAGMENTS.  - NEGATIVE FOR HIGH-GRADE DYSPLASIA AND MALIGNANCY.   Past Medical History:  Diagnosis Date   Breast cancer ([place])  left breast   Chronic renal insufficiency, stage 2 (mild) 04/08/2017   EGFR 55 on 04/08/2017, 60 [id number]   Complication of anesthesia    Elevated LFTs 08/07/2021   Headache    Hyperlipidemia    Hypertension    Malignant neoplasm of upper-outer quadrant of left breast in female, estrogen receptor positive ([place]) 05/19/2019   Personal history of chemotherapy    Personal history of radiation therapy    PONV (postoperative nausea and vomiting) 1987   Pre-diabetes    Severe obesity with body mass index (BMI) of 35.0 to 39.9 with comorbidity ([place]) 10/11/2015   Trigger finger of right hand    and thumb   Vertigo    intermittent especially lying on left side    Past Surgical History:  Procedure Laterality Date    ABDOMINAL HYSTERECTOMY  2004   complete   BREAST BIOPSY Left 05/05/2019   [facility]- ribbon clip   BREAST BIOPSY Left 05/16/2019   [facility]-DCIS   BREAST BIOPSY Left 05/16/2019   [facility]- X clip   BREAST BIOPSY WITH SENTINEL LYMPH NODE BIOPSY AND NEEDLE LOCALIZATION Left 06/05/2019   Procedure: BREAST BIOPSY WITH SENTINEL LYMPH NODE BIOPSY AND NEEDLE LOCALIZATION, LEFT, BRACKETING WIRE;  Surgeon: Robert Bellow, MD;  Location: [facility] ORS;  Service: General;  Laterality: Left;   BREAST CYST ASPIRATION Bilateral age 1   neg   BREAST LUMPECTOMY Left 05/2019   positive   CHOLECYSTECTOMY     COLONOSCOPY WITH PROPOFOL N/A 08/02/2018   Procedure: COLONOSCOPY WITH PROPOFOL;  Surgeon: [name], MD;  Location: [facility] ENDOSCOPY;  Service: Endoscopy;  Laterality: N/A;   COLONOSCOPY WITH PROPOFOL N/A 12/01/2021   Procedure: COLONOSCOPY WITH PROPOFOL;  Surgeon: Lin Landsman, MD;  Location: [facility] ENDOSCOPY;  Service: Gastroenterology;  Laterality: N/A;   DILATION AND CURETTAGE OF UTERUS     goiter surgery     MASTECTOMY Left 07/10/2019   OOPHORECTOMY     PORTACATH PLACEMENT Left 07/10/2019   Procedure: INSERTION PORT-A-CATH;  Surgeon: Jules Husbands, MD;  Location: [facility] ORS;  Service: General;  Laterality: Left;   SIMPLE MASTECTOMY WITH AXILLARY SENTINEL NODE BIOPSY Left 07/10/2019   Procedure: SIMPLE MASTECTOMY.; LEFT;  Surgeon: Jules Husbands, MD;  Location: [facility] ORS;  Service: General;  Laterality: Left;   THYROIDECTOMY, PARTIAL     TRIGGER FINGER RELEASE     rigt hand thumb and pointer finger    Current Outpatient Medications:    acetaminophen (TYLENOL) 500 MG tablet, Take 500 mg by mouth every 6 (six) hours as needed (for pain/headaches.)., Disp: , Rfl:    anastrozole (ARIMIDEX) 1 MG tablet, TAKE 1 TABLET BY MOUTH EVERY DAY, Disp: 90 tablet, Rfl: 3   aspirin EC 81 MG tablet, Take 81 mg by mouth daily., Disp: , Rfl:    atorvastatin (LIPITOR) 10 MG tablet, Take 10 mg by mouth daily. , Disp: , Rfl:     benzonatate (TESSALON) 100 MG capsule, Take 2 capsules (200 mg total) by mouth every 8 (eight) hours., Disp: 21 capsule, Rfl: 0   Calcium Carbonate-Vitamin D 600-400 MG-UNIT tablet, Take 2 tablets by mouth daily. , Disp: , Rfl:    DUPILUMAB [place], Inject into the skin., Disp: , Rfl:    levofloxacin (LEVAQUIN) 500 MG tablet, Take 1 tablet (500 mg total) by mouth daily., Disp: 7 tablet, Rfl: 0   lisinopril-hydrochlorothiazide (PRINZIDE,ZESTORETIC) 20-12.5 MG tablet, Take 1 tablet by mouth daily., Disp: , Rfl:    metFORMIN (GLUCOPHAGE-XR) 750 MG 24 hr  tablet, Take 1,500 mg by mouth daily., Disp: , Rfl:    Multiple Vitamin (MULTIVITAMIN WITH MINERALS) TABS tablet, Take 1 tablet by mouth daily after breakfast. Centrum Silver, Disp: , Rfl:    promethazine-dextromethorphan (PROMETHAZINE-DM) 6.25-15 MG/5ML syrup, Take 5 mLs by mouth 4 (four) times daily as needed., Disp: 118 mL, Rfl: 0   triamcinolone cream (KENALOG) 0.1 %, Apply 1 Application topically 2 (two) times daily., Disp: , Rfl:     Family History  Problem Relation Age of Onset   Breast cancer Cousin        mat cousin   Hypertension Mother    Heart attack Father    Prostate cancer Father    Diabetes Father    Colon polyps Father      Social History   Tobacco Use   Smoking status: Never   Smokeless tobacco: Never  Vaping Use   Vaping Use: Never used  Substance Use Topics   Alcohol use: Not Currently   Drug use: No    Allergies as of 02/16/2023 - Review Complete 02/16/2023  Allergen Reaction Noted   Shellfish allergy Rash 10/13/2018   Sulfa antibiotics Rash 07/31/2017    Review of Systems:    All systems reviewed and negative except where noted in HPI.   Physical Exam:  BP 124/73 (BP Location: Left Arm, Patient Position: Sitting, Cuff Size: Normal)   Pulse 92   Temp 97.8 F (36.6 C) (Oral)   Ht 5' (1.524 m)   Wt 180 lb 4 oz (81.8 kg)   BMI 35.20 kg/m  No LMP recorded. Patient has had a hysterectomy.  General:    Alert,  Well-developed, well-nourished, pleasant and cooperative in NAD Head:  Normocephalic and atraumatic. Eyes:  Sclera clear, no icterus.   Conjunctiva pink. Ears:  Normal auditory acuity. Nose:  No deformity, discharge, or lesions. Mouth:  No deformity or lesions,oropharynx pink & moist. Neck:  Supple; no masses or thyromegaly. Lungs:  Respirations even and unlabored.  Clear throughout to auscultation.   No wheezes, crackles, or rhonchi. No acute distress. Heart:  Regular rate and rhythm; no murmurs, clicks, rubs, or gallops. Abdomen:  Normal bowel sounds. Soft, obese, non-tender and non-distended without masses, hepatosplenomegaly or hernias noted.  No guarding or rebound tenderness.   Rectal: Not performed Msk:  Symmetrical without gross deformities. Good, equal movement & strength bilaterally. Pulses:  Normal pulses noted. Extremities:  No clubbing or edema.  No cyanosis. Neurologic:  Alert and oriented x3;  grossly normal neurologically. Skin:  Intact without significant lesions or rashes. No jaundice. Psych:  Alert and cooperative. Normal mood and affect.  Imaging Studies: Reviewed  Assessment and Plan:   AINSLEIGH DROHAN is a 72 y.o. pleasant Caucasian female with history of metabolic syndrome, stage I breast cancer status postlumpectomy, adjuvant chemo and radiation, currently maintained on Arimidex, s/p cholecystectomy is seen in consultation for elevated LFTs, fatty liver   NASH fibrosis: Biopsy-proven: LFTs have been normal since 12/22 secondary liver disease work-up, revealed elevated antimitochondrial antibodies Underwent liver biopsy in 12/22 which revealed severe steatosis with features of steatohepatitis, stage II fibrosis, single small portal epithelioid granuloma.  QuantiFERON gold was negative. continue metformin [facility] fibrosis panel in 07/2022 revealed no evidence of fibrosis, moderate to severe steatosis, moderate [facility] fibrosis panel today Continue  healthy diet and exercise   Follow up annually or sooner if needed   Cephas Darby, MD

## 2023-02-18 ENCOUNTER — Ambulatory Visit
Admission: RE | Admit: 2023-02-18 | Discharge: 2023-02-18 | Disposition: A | Discharge status: Home / Self Care | Payer: Medicare Other | Source: Ambulatory Visit | Attending: Radiation Oncology | Admitting: Radiation Oncology

## 2023-02-18 ENCOUNTER — Encounter: Payer: Self-pay | Admitting: Radiation Oncology

## 2023-02-18 VITALS — BP 132/70 | HR 87 | Temp 96.7°F | Resp 18 | Wt 179.0 lb

## 2023-02-18 DIAGNOSIS — Z79811 Long term (current) use of aromatase inhibitors: Secondary | ICD-10-CM | POA: Insufficient documentation

## 2023-02-18 DIAGNOSIS — Z170 Estrogen receptor positive status [ER+]: Secondary | ICD-10-CM | POA: Diagnosis not present

## 2023-02-18 DIAGNOSIS — C50412 Malignant neoplasm of upper-outer quadrant of left female breast: Secondary | ICD-10-CM | POA: Insufficient documentation

## 2023-02-18 DIAGNOSIS — Z923 Personal history of irradiation: Secondary | ICD-10-CM | POA: Diagnosis not present

## 2023-02-18 DIAGNOSIS — C50419 Malignant neoplasm of upper-outer quadrant of unspecified female breast: Secondary | ICD-10-CM | POA: Insufficient documentation

## 2023-02-18 LAB — NASH FIBROSURE(R) PLUS
ALPHA 2-MACROGLOBULINS, QN: 278 mg/dL — ABNORMAL HIGH (ref 110–276)
ALT (SGPT) P5P: 16 IU/L (ref 0–40)
AST (SGOT) P5P: 21 IU/L (ref 0–40)
Apolipoprotein A-1: 140 mg/dL (ref 116–209)
Bilirubin, Total: 0.2 mg/dL (ref 0.0–1.2)
Cholesterol, Total: 118 mg/dL (ref 100–199)
Fibrosis Score: 0.14 (ref 0.00–0.21)
GGT: 24 IU/L (ref 0–60)
Glucose: 120 mg/dL — ABNORMAL HIGH (ref 70–99)
Haptoglobin: 573 mg/dL (ref 42–346)
NASH Score: 0.54 — ABNORMAL HIGH (ref 0.00–0.25)
Steatosis Score: 0.55 — ABNORMAL HIGH (ref 0.00–0.40)
Triglycerides: 132 mg/dL (ref 0–149)

## 2023-02-18 NOTE — Progress Notes (Signed)
Radiation Oncology Follow up Note  Name: Amanda Wade   Date:   02/18/2023 MRN:  [id number] DOB: Jul 28, 1951    This 72 y.o. female presents to the clinic today for 3-year follow-up status post chest wall radiation therapy to her left chest wall and peripheral lymphatics for stage T1b N1a ER/PR positive invasive mammary carcinoma status post modified radical mastectomy  REFERRING PROVIDER: Leonel Ramsay, MD  HPI: Patient is a 72 year old female now seen out over 3 years having completed left chest wall and peripheral lymphatic radiation therapy for stage T1b N1a ER/PR positive invasive mammary carcinoma status post left modified radical mastectomy seen today in routine follow-up she is doing well.  She specifically denies chest any chest wall tenderness cough or bone pain..  She had a mammogram of her right breast 6 months ago which was BI-RADS 1 negative.  She is currently on Arimidex tolerating it well without side effect.  COMPLICATIONS OF TREATMENT: none  FOLLOW UP COMPLIANCE: keeps appointments   PHYSICAL EXAM:  BP 132/70 (Patient Position: Sitting)   Pulse 87   Temp (!) 96.7 F (35.9 C) (Tympanic)   Resp 18   Wt 179 lb (81.2 kg)   SpO2 97%   BMI 34.96 kg/m  Patient status post left modified radical mastectomy chest wall is well-healed no evidence of chest wall mass or nodularity is noted.  No swelling in her left upper extremity is noted.  Right breast is free of dominant mass.  Well-developed well-nourished patient in NAD. HEENT reveals PERLA, EOMI, discs not visualized.  Oral cavity is clear. No oral mucosal lesions are identified. Neck is clear without evidence of cervical or supraclavicular adenopathy. Lungs are clear to A&P. Cardiac examination is essentially unremarkable with regular rate and rhythm without murmur rub or thrill. Abdomen is benign with no organomegaly or masses noted. Motor sensory and DTR levels are equal and symmetric in the upper and lower extremities.  Cranial nerves II through XII are grossly intact. Proprioception is intact. No peripheral adenopathy or edema is identified. No motor or sensory levels are noted. Crude visual fields are within normal range.  RADIOLOGY RESULTS: Right breast mammogram reviewed compatible with above-stated findings  PLAN: Present time patient is doing well now to out over 3 years with no evidence of disease.  I will turn follow-up care over to medical oncology.  I be happy to reevaluate the patient anytime should that be indicated.  I would like to take this opportunity to thank you for allowing me to participate in the care of your patient.[name], MD

## 2023-02-19 ENCOUNTER — Inpatient Hospital Stay (HOSPITAL_BASED_OUTPATIENT_CLINIC_OR_DEPARTMENT_OTHER): Payer: Medicare Other | Admitting: Oncology

## 2023-02-19 ENCOUNTER — Encounter: Payer: Self-pay | Admitting: Oncology

## 2023-02-19 ENCOUNTER — Inpatient Hospital Stay: Payer: Medicare Other

## 2023-02-19 ENCOUNTER — Inpatient Hospital Stay: Payer: Medicare Other | Attending: Oncology

## 2023-02-19 VITALS — BP 132/62 | HR 87 | Temp 97.8°F | Resp 18 | Ht 61.0 in | Wt 178.7 lb

## 2023-02-19 DIAGNOSIS — Z5181 Encounter for therapeutic drug level monitoring: Secondary | ICD-10-CM | POA: Diagnosis not present

## 2023-02-19 DIAGNOSIS — Z7984 Long term (current) use of oral hypoglycemic drugs: Secondary | ICD-10-CM | POA: Diagnosis not present

## 2023-02-19 DIAGNOSIS — Z853 Personal history of malignant neoplasm of breast: Secondary | ICD-10-CM

## 2023-02-19 DIAGNOSIS — Z79811 Long term (current) use of aromatase inhibitors: Secondary | ICD-10-CM | POA: Insufficient documentation

## 2023-02-19 DIAGNOSIS — Z7983 Long term (current) use of bisphosphonates: Secondary | ICD-10-CM

## 2023-02-19 DIAGNOSIS — I129 Hypertensive chronic kidney disease with stage 1 through stage 4 chronic kidney disease, or unspecified chronic kidney disease: Secondary | ICD-10-CM | POA: Insufficient documentation

## 2023-02-19 DIAGNOSIS — Z79899 Other long term (current) drug therapy: Secondary | ICD-10-CM | POA: Insufficient documentation

## 2023-02-19 DIAGNOSIS — Z803 Family history of malignant neoplasm of breast: Secondary | ICD-10-CM | POA: Diagnosis not present

## 2023-02-19 DIAGNOSIS — Z7982 Long term (current) use of aspirin: Secondary | ICD-10-CM | POA: Insufficient documentation

## 2023-02-19 DIAGNOSIS — Z170 Estrogen receptor positive status [ER+]: Secondary | ICD-10-CM | POA: Diagnosis not present

## 2023-02-19 DIAGNOSIS — C50412 Malignant neoplasm of upper-outer quadrant of left female breast: Secondary | ICD-10-CM | POA: Diagnosis not present

## 2023-02-19 DIAGNOSIS — Z8042 Family history of malignant neoplasm of prostate: Secondary | ICD-10-CM | POA: Diagnosis not present

## 2023-02-19 DIAGNOSIS — Z9221 Personal history of antineoplastic chemotherapy: Secondary | ICD-10-CM | POA: Insufficient documentation

## 2023-02-19 DIAGNOSIS — Z923 Personal history of irradiation: Secondary | ICD-10-CM | POA: Insufficient documentation

## 2023-02-19 DIAGNOSIS — Z9012 Acquired absence of left breast and nipple: Secondary | ICD-10-CM | POA: Insufficient documentation

## 2023-02-19 DIAGNOSIS — N182 Chronic kidney disease, stage 2 (mild): Secondary | ICD-10-CM | POA: Diagnosis not present

## 2023-02-19 DIAGNOSIS — Z08 Encounter for follow-up examination after completed treatment for malignant neoplasm: Secondary | ICD-10-CM

## 2023-02-19 LAB — COMPREHENSIVE METABOLIC PANEL
ALT: 17 U/L (ref 0–44)
AST: 26 U/L (ref 15–41)
Albumin: 3.6 g/dL (ref 3.5–5.0)
Alkaline Phosphatase: 72 U/L (ref 38–126)
Anion gap: 9 (ref 5–15)
BUN: 17 mg/dL (ref 8–23)
CO2: 28 mmol/L (ref 22–32)
Calcium: 9.5 mg/dL (ref 8.9–10.3)
Chloride: 98 mmol/L (ref 98–111)
Creatinine, Ser: 0.98 mg/dL (ref 0.44–1.00)
GFR, Estimated: >60 mL/min (ref >60)
Glucose, Bld: 143 mg/dL — ABNORMAL HIGH (ref 70–99)
Potassium: 3.9 mmol/L (ref 3.5–5.1)
Sodium: 135 mmol/L (ref 135–145)
Total Bilirubin: 0.3 mg/dL (ref 0.3–1.2)
Total Protein: 7.7 g/dL (ref 6.5–8.1)

## 2023-02-19 LAB — CBC WITH DIFFERENTIAL/PLATELET
Abs Immature Granulocytes: 0.57 10*3/uL — ABNORMAL HIGH (ref 0.00–0.07)
Basophils Absolute: 0.1 10*3/uL (ref 0.0–0.1)
Basophils Relative: 1 %
Eosinophils Absolute: 0.2 10*3/uL (ref 0.0–0.5)
Eosinophils Relative: 2 %
HCT: 33.1 % — ABNORMAL LOW (ref 36.0–46.0)
Hemoglobin: 11.4 g/dL — ABNORMAL LOW (ref 12.0–15.0)
Immature Granulocytes: 5 %
Lymphocytes Relative: 15 %
Lymphs Abs: 1.6 10*3/uL (ref 0.7–4.0)
MCH: 28.9 pg (ref 26.0–34.0)
MCHC: 34.4 g/dL (ref 30.0–36.0)
MCV: 84 fL (ref 80.0–100.0)
Monocytes Absolute: 0.6 10*3/uL (ref 0.1–1.0)
Monocytes Relative: 6 %
Neutro Abs: 7.5 10*3/uL (ref 1.7–7.7)
Neutrophils Relative %: 71 %
Platelets: 440 10*3/uL — ABNORMAL HIGH (ref 150–400)
RBC: 3.94 MIL/uL (ref 3.87–5.11)
RDW: 13 % (ref 11.5–15.5)
WBC: 10.6 10*3/uL — ABNORMAL HIGH (ref 4.0–10.5)
nRBC: 0 % (ref 0.0–0.2)

## 2023-02-19 MED ORDER — SODIUM CHLORIDE 0.9 % IV SOLN
INTRAVENOUS | Status: DC | PRN
  Filled 2023-02-19: qty 250

## 2023-02-19 MED ORDER — ZOLEDRONIC ACID 4 MG/100ML IV SOLN
4.0000 mg | INTRAVENOUS | Status: DC
  Administered 2023-02-19: 4 mg via INTRAVENOUS
  Filled 2023-02-19: qty 100

## 2023-02-19 NOTE — Progress Notes (Signed)
Hematology/Oncology Consult note [facility]  [phone] [phone]  Patient Care Team: Leonel Ramsay, MD as PCP - General (Infectious Diseases) Sindy Guadeloupe, MD as Consulting Physician (Oncology) Bary Castilla, Forest Gleason, MD as Consulting Physician (General Surgery) Rico Junker, RN as Oncology Nurse Navigator [name], MD as Referring Physician (Radiation Oncology)   Name of the patient: Amanda Wade  [id number]  11/30/1950   Date of visit: 02/19/23  Diagnosis- pathological prognostic stage IA mpT1b mpN1a Invasive mammary carcinoma of the left breast status post lumpectomy and sentinel lymph node biopsy    Chief complaint/ Reason for visit- routine f/u of breast cancer  Heme/Onc history:  Patient is a 72 year old postmenopausal female with a past medical history significant for hypertension hyperlipidemia among other medical problems.  Shey underwent a screening mammogram on 04/28/2019 which showed possible mass in the left breast.  This was followed by diagnostic mammogram and ultrasound which showed bilobar hypo-Echoic mass at the 2:30 position of the left breast measuring 0.5 x 0.6 x 1.1 cm.  Hypoechoic mass at the 2 o'clock position as well.  And another bilobed irregular hypoechoic mass at the 3 o'clock position measuring 4 x 5 x 6 mm.  Patient had biopsy of all these 3 masses.  The 3 o'clock position breast mass came back as invasive mammary carcinoma 6 mm, grade 1 strongly ER PR positive greater than 90% and HER-2/neu negative.  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  230 breast mass also came back as intermediate grade DCIS.  There was also another mass noted in the left breast which was biopsied and was 5 mm grade 1 ER PR positive and HER-2/neu negative.   Patient was taken for lumpectomy and sentinel lymph node biopsy by Dr. Bary Castilla on 06/05/2019.Final pathology showed invasive mammary carcinoma grade 1 ER  PR positive and HER-2/neu negative at 4 different biopsy sites with extensive DCIS predominantly micropapillary type.  One lymph node was also involved with macro metastatic carcinoma and was negative for extranodal extension   Patient's case was discussed at tumor board and there was a concern that there may be potentially more areas of abnormality given extensive DCIS and invasive cancer seen in multiple foci in the lumpectomy specimen.  Plan was to proceed with bilateral MRI.Breast MRI showed a large area of clumped discontinued non-mass enhancement in the left upper breast spanning an area of 9.8 cm in AP dimension suspicious for residual DCIS or invasive carcinoma.  Patient therefore proceeded with r left mastectomy which showed small area of residual DCIS but no evidence of invasive malignancy.   Patient also had a MammaPrint testing done on her lumpectomy specimen which came back as high risk group.  29% chance of recurrence without chemotherapy at 5 years.  Greater than 12% chemo benefit and estimated > 93% survival at 5 years with chemotherapy and hormone therapy  Interval history-patient is tolerating Arimidex along with calcium and vitamin D well without any significant side effects.  Denies any breast concerns at this time.  Denies any new aches and pains anywhere  ECOG PS- 1 Pain scale- 0   Review of systems- Review of Systems  Constitutional:  Negative for chills, fever, malaise/fatigue and weight loss.  HENT:  Negative for congestion, ear discharge and nosebleeds.   Eyes:  Negative for blurred vision.  Respiratory:  Negative for cough, hemoptysis, sputum production, shortness of breath and wheezing.   Cardiovascular:  Negative for chest  pain, palpitations, orthopnea and claudication.  Gastrointestinal:  Negative for abdominal pain, blood in stool, constipation, diarrhea, heartburn, melena, nausea and vomiting.  Genitourinary:  Negative for dysuria, flank pain, frequency, hematuria  and urgency.  Musculoskeletal:  Negative for back pain, joint pain and myalgias.  Skin:  Negative for rash.  Neurological:  Negative for dizziness, tingling, focal weakness, seizures, weakness and headaches.  Endo/Heme/Allergies:  Does not bruise/bleed easily.  Psychiatric/Behavioral:  Negative for depression and suicidal ideas. The patient does not have insomnia.       Allergies  Allergen Reactions   Shellfish Allergy Rash   Sulfa Antibiotics Rash     Past Medical History:  Diagnosis Date   Breast cancer ([place])    left breast   Chronic renal insufficiency, stage 2 (mild) 04/08/2017   EGFR 55 on 04/08/2017, 62 [id number]   Complication of anesthesia    Elevated LFTs 08/07/2021   Headache    Hyperlipidemia    Hypertension    Malignant neoplasm of upper-outer quadrant of left breast in female, estrogen receptor positive ([place]) 05/19/2019   Personal history of chemotherapy    Personal history of radiation therapy    PONV (postoperative nausea and vomiting) 1987   Pre-diabetes    Severe obesity with body mass index (BMI) of 35.0 to 39.9 with comorbidity ([place]) 10/11/2015   Trigger finger of right hand    and thumb   Vertigo    intermittent especially lying on left side     Past Surgical History:  Procedure Laterality Date   ABDOMINAL HYSTERECTOMY  2004   complete   BREAST BIOPSY Left 05/05/2019   [facility]- ribbon clip   BREAST BIOPSY Left 05/16/2019   [facility]-DCIS   BREAST BIOPSY Left 05/16/2019   [facility]- X clip   BREAST BIOPSY WITH SENTINEL LYMPH NODE BIOPSY AND NEEDLE LOCALIZATION Left 06/05/2019   Procedure: BREAST BIOPSY WITH SENTINEL LYMPH NODE BIOPSY AND NEEDLE LOCALIZATION, LEFT, BRACKETING WIRE;  Surgeon: Robert Bellow, MD;  Location: [facility] ORS;  Service: General;  Laterality: Left;   BREAST CYST ASPIRATION Bilateral age 5   neg   BREAST LUMPECTOMY Left 05/2019   positive   CHOLECYSTECTOMY     COLONOSCOPY WITH PROPOFOL N/A 08/02/2018   Procedure: COLONOSCOPY WITH  PROPOFOL;  Surgeon: [name], MD;  Location[facility] ENDOSCOPY;  Service: Endoscopy;  Laterality: N/A;   COLONOSCOPY WITH PROPOFOL N/A 12/01/2021   Procedure: COLONOSCOPY WITH PROPOFOL;  Surgeon: Lin Landsman, MD;  Location: [facility] ENDOSCOPY;  Service: Gastroenterology;  Laterality: N/A;   DILATION AND CURETTAGE OF UTERUS     goiter surgery     MASTECTOMY Left 07/10/2019   OOPHORECTOMY     PORTACATH PLACEMENT Left 07/10/2019   Procedure: INSERTION PORT-A-CATH;  Surgeon: Jules Husbands, MD;  Location: [facility] ORS;  Service: General;  Laterality: Left;   SIMPLE MASTECTOMY WITH AXILLARY SENTINEL NODE BIOPSY Left 07/10/2019   Procedure: SIMPLE MASTECTOMY.; LEFT;  Surgeon: Jules Husbands, MD;  Location: [facility] ORS;  Service: General;  Laterality: Left;   THYROIDECTOMY, PARTIAL     TRIGGER FINGER RELEASE     rigt hand thumb and pointer finger    Social History   Socioeconomic History   Marital status: Divorced    Spouse name: Not on file   Number of children: Not on file   Years of education: Not on file   Highest education level: Not on file  Occupational History   Not on file  Tobacco Use   Smoking status:  Never   Smokeless tobacco: Never  Vaping Use   Vaping Use: Never used  Substance and Sexual Activity   Alcohol use: Not Currently   Drug use: No   Sexual activity: Not Currently  Other Topics Concern   Not on file  Social History Narrative   Not on file   Social Determinants of Health   Financial Resource Strain: Not on file  Food Insecurity: Not on file  Transportation Needs: Not on file  Physical Activity: Not on file  Stress: Not on file  Social Connections: Not on file  Intimate Partner Violence: Not on file    Family History  Problem Relation Age of Onset   Breast cancer Cousin        mat cousin   Hypertension Mother    Heart attack Father    Prostate cancer Father    Diabetes Father    Colon polyps Father      Current Outpatient Medications:     acetaminophen (TYLENOL) 500 MG tablet, Take 500 mg by mouth every 6 (six) hours as needed (for pain/headaches.)., Disp: , Rfl:    anastrozole (ARIMIDEX) 1 MG tablet, TAKE 1 TABLET BY MOUTH EVERY DAY, Disp: 90 tablet, Rfl: 3   aspirin EC 81 MG tablet, Take 81 mg by mouth daily., Disp: , Rfl:    atorvastatin (LIPITOR) 10 MG tablet, Take 10 mg by mouth daily. , Disp: , Rfl:    benzonatate (TESSALON) 100 MG capsule, Take 2 capsules (200 mg total) by mouth every 8 (eight) hours., Disp: 21 capsule, Rfl: 0   Calcium Carbonate-Vitamin D 600-400 MG-UNIT tablet, Take 2 tablets by mouth daily. , Disp: , Rfl:    DUPILUMAB Brady, Inject into the skin., Disp: , Rfl:    levofloxacin (LEVAQUIN) 500 MG tablet, Take 1 tablet (500 mg total) by mouth daily., Disp: 7 tablet, Rfl: 0   lisinopril-hydrochlorothiazide (PRINZIDE,ZESTORETIC) 20-12.5 MG tablet, Take 1 tablet by mouth daily., Disp: , Rfl:    metFORMIN (GLUCOPHAGE-XR) 750 MG 24 hr tablet, Take 1,500 mg by mouth daily., Disp: , Rfl:    Multiple Vitamin (MULTIVITAMIN WITH MINERALS) TABS tablet, Take 1 tablet by mouth daily after breakfast. Centrum Silver, Disp: , Rfl:    promethazine-dextromethorphan (PROMETHAZINE-DM) 6.25-15 MG/5ML syrup, Take 5 mLs by mouth 4 (four) times daily as needed., Disp: 118 mL, Rfl: 0   triamcinolone cream (KENALOG) 0.1 %, Apply 1 Application topically 2 (two) times daily., Disp: , Rfl:   Physical exam:  Vitals:   02/19/23 0930  BP: 132/62  Pulse: 87  Resp: 18  Temp: 97.8 F (36.6 C)  TempSrc: Tympanic  SpO2: 93%  Weight: 178 lb 11.2 oz (81.1 kg)  Height: 5\' 1"  (1.549 m)   Physical Exam Cardiovascular:     Rate and Rhythm: Normal rate and regular rhythm.     Heart sounds: Normal heart sounds.  Pulmonary:     Effort: Pulmonary effort is normal.     Breath sounds: Normal breath sounds.  Abdominal:     General: Bowel sounds are normal.     Palpations: Abdomen is soft.  Skin:    General: Skin is warm and dry.   Neurological:     Mental Status: She is alert and oriented to person, place, and time.    Breast exam is performed in seated and lying down position. Patient is status post right mastectomy without reconstruction. No evidence of any chest wall recurrence. No evidence of bilateral axillary adenopathy. No palpable masses in the  left breast      Latest Ref Rng & Units 02/19/2023    9:16 AM  CMP  Glucose 70 - 99 mg/dL 143   BUN 8 - 23 mg/dL 17   Creatinine 0.44 - 1.00 mg/dL 0.98   Sodium 135 - 145 mmol/L 135   Potassium 3.5 - 5.1 mmol/L 3.9   Chloride 98 - 111 mmol/L 98   CO2 22 - 32 mmol/L 28   Calcium 8.9 - 10.3 mg/dL 9.5   Total Protein 6.5 - 8.1 g/dL 7.7   Total Bilirubin 0.3 - 1.2 mg/dL 0.3   Alkaline Phos 38 - 126 U/L 72   AST 15 - 41 U/L 26   ALT 0 - 44 U/L 17       Latest Ref Rng & Units 02/19/2023    9:16 AM  CBC  WBC 4.0 - 10.5 K/uL 10.6   Hemoglobin 12.0 - 15.0 g/dL 11.4   Hematocrit 36.0 - 46.0 % 33.1   Platelets 150 - 400 K/uL 440     No images are attached to the encounter.  DG Chest 2 View  Result Date: 02/14/2023 CLINICAL DATA:  Productive cough for 4 days EXAM: CHEST - 2 VIEW COMPARISON:  07/10/2019 FINDINGS: The heart size and mediastinal contours are within normal limits. Right lower lobe interstitial opacity. Left lung is clear. No pleural effusion or pneumothorax. The visualized skeletal structures are unremarkable. IMPRESSION: Right lower lobe interstitial opacity, suspicious for pneumonia. Electronically Signed   By: Davina Poke D.O.   On: 02/14/2023 09:58     Assessment and plan- Patient is a 72 y.o. female with history of stage I left breast cancer ER/PR positive HER2 negative status postlumpectomy adjuvant chemotherapy and radiation presently on Arimidex.  This is a routine follow-up visit for breast cancer  Clinically patient is doing well with no concerning signs and symptoms of recurrence based on today's exam.  She is receiving adjuvant Zometa  this will be her last dose today marking 3 years of treatment.  I would like her to continue Arimidex along with calcium and vitamin D for a total of 10 years.  She seems to be tolerating that well without any significant side effects.  I will see her back in 6 months no labs   Visit Diagnosis 1. Encounter for follow-up surveillance of breast cancer   2. Visit for monitoring Arimidex therapy   3. Encounter for monitoring zoledronic acid therapy      Dr. Randa Evens, MD, MPH [facility] [id number] 02/19/2023 4:33 PM

## 2023-02-26 ENCOUNTER — Other Ambulatory Visit: Payer: Self-pay | Admitting: *Deleted

## 2023-02-26 ENCOUNTER — Encounter: Payer: Self-pay | Admitting: Oncology

## 2023-02-26 DIAGNOSIS — Z1231 Encounter for screening mammogram for malignant neoplasm of breast: Secondary | ICD-10-CM

## 2023-02-26 DIAGNOSIS — Z170 Estrogen receptor positive status [ER+]: Secondary | ICD-10-CM

## 2023-03-02 ENCOUNTER — Other Ambulatory Visit: Payer: Self-pay | Admitting: *Deleted

## 2023-03-02 DIAGNOSIS — Z1231 Encounter for screening mammogram for malignant neoplasm of breast: Secondary | ICD-10-CM

## 2023-03-02 DIAGNOSIS — Z170 Estrogen receptor positive status [ER+]: Secondary | ICD-10-CM

## 2023-05-12 ENCOUNTER — Ambulatory Visit
Admission: RE | Admit: 2023-05-12 | Discharge: 2023-05-12 | Disposition: A | Discharge status: Home / Self Care | Payer: Medicare Other | Source: Ambulatory Visit | Attending: Oncology | Admitting: Oncology

## 2023-05-12 DIAGNOSIS — Z1231 Encounter for screening mammogram for malignant neoplasm of breast: Secondary | ICD-10-CM | POA: Insufficient documentation

## 2023-05-12 DIAGNOSIS — Z853 Personal history of malignant neoplasm of breast: Secondary | ICD-10-CM | POA: Insufficient documentation

## 2023-05-12 DIAGNOSIS — C50412 Malignant neoplasm of upper-outer quadrant of left female breast: Secondary | ICD-10-CM

## 2023-05-12 DIAGNOSIS — Z9012 Acquired absence of left breast and nipple: Secondary | ICD-10-CM | POA: Diagnosis not present

## 2023-07-25 ENCOUNTER — Ambulatory Visit
Admission: EM | Admit: 2023-07-25 | Discharge: 2023-07-25 | Disposition: A | Discharge status: Home / Self Care | Payer: Medicare Other | Attending: Emergency Medicine | Admitting: Emergency Medicine

## 2023-07-25 ENCOUNTER — Encounter: Payer: Self-pay | Admitting: Emergency Medicine

## 2023-07-25 DIAGNOSIS — J069 Acute upper respiratory infection, unspecified: Secondary | ICD-10-CM | POA: Diagnosis not present

## 2023-07-25 LAB — GROUP A STREP BY PCR: Group A Strep by PCR: NOT DETECTED

## 2023-07-25 NOTE — ED Provider Notes (Signed)
[facility]    CSN: [id number] Arrival date & time: 07/25/23  0848      History   Chief Complaint Chief Complaint  Patient presents with   Cough    HPI Amanda Wade is a 72 y.o. female.   72 year old female who presents to urgent care with complaints of significant cough, sore throat and fatigue for at least the last 10 to 14 days.  On August 6 she tested positive for COVID but retested herself a few days ago and it was negative.  She denies any fevers, chills, shortness of breath, headache, nausea, vomiting.  She is trying to stay active but has noticed a lot of fatigue.  The cough was very severe over the last 2 days.  She denies any sick contacts other than her positive COVID test on August 6.   Cough Associated symptoms: no chest pain, no chills, no ear pain, no fever, no rash, no shortness of breath and no sore throat     Past Medical History:  Diagnosis Date   Breast cancer (HCC)    left breast   Chronic renal insufficiency, stage 2 (mild) 04/08/2017   EGFR 55 on 04/08/2017, 62 10/13/2018   Complication of anesthesia    Elevated LFTs 08/07/2021   Headache    Hyperlipidemia    Hypertension    Malignant neoplasm of upper-outer quadrant of left breast in female, estrogen receptor positive (HCC) 05/19/2019   Personal history of chemotherapy    Personal history of radiation therapy    PONV (postoperative nausea and vomiting) 1987   Pre-diabetes    Severe obesity with body mass index (BMI) of 35.0 to 39.9 with comorbidity (HCC) 10/11/2015   Trigger finger of right hand    and thumb   Vertigo    intermittent especially lying on left side    Patient Active Problem List   Diagnosis Date Noted   Malignant neoplasm of upper outer quadrant of female breast (HCC) 02/18/2023   Long term current use of aromatase inhibitor 08/07/2021   Lichen sclerosus of female genitalia 10/29/2020   Trigger finger of right thumb 06/12/2020   Goals of care,  counseling/discussion 05/21/2019   Malignant neoplasm of upper-outer quadrant of left breast in female, estrogen receptor positive (HCC) 05/19/2019   Primary osteoarthritis of right ankle 11/09/2017   Primary osteoarthritis of both knees 10/11/2015   High risk medication use 04/08/2015   Hyperlipidemia associated with type 2 diabetes mellitus (HCC) 10/01/2014   Hypertension associated with type 2 diabetes mellitus (HCC) 10/01/2014   Type 2 diabetes mellitus with stage 2 chronic kidney disease, without long-term current use of insulin (HCC) 10/01/2014   History of colonic polyps 08/15/2013    Past Surgical History:  Procedure Laterality Date   ABDOMINAL HYSTERECTOMY  2004   complete   BREAST BIOPSY Left 05/05/2019   [facility]- ribbon clip   BREAST BIOPSY Left 05/16/2019   [facility]-DCIS   BREAST BIOPSY Left 05/16/2019   [facility]- [facility]   BREAST BIOPSY WITH SENTINEL LYMPH NODE BIOPSY AND NEEDLE LOCALIZATION Left 06/05/2019   Procedure: BREAST BIOPSY WITH SENTINEL LYMPH NODE BIOPSY AND NEEDLE LOCALIZATION, LEFT, BRACKETING WIRE;  Surgeon: Earline Mayotte, MD;  Location: [facility] ORS;  Service: General;  Laterality: Left;   BREAST CYST ASPIRATION Bilateral age 25   neg   BREAST LUMPECTOMY Left 05/2019   positive   CHOLECYSTECTOMY     COLONOSCOPY WITH PROPOFOL N/A 08/02/2018   Procedure: COLONOSCOPY WITH PROPOFOL;  Surgeon: Marva Panda,  Cindra Eves, MD;  Location: [facility] ENDOSCOPY;  Service: Endoscopy;  Laterality: N/A;   COLONOSCOPY WITH PROPOFOL N/A 12/01/2021   Procedure: COLONOSCOPY WITH PROPOFOL;  Surgeon: Toney Reil, MD;  Location: [facility] ENDOSCOPY;  Service: Gastroenterology;  Laterality: N/A;   DILATION AND CURETTAGE OF UTERUS     goiter surgery     MASTECTOMY Left 07/10/2019   OOPHORECTOMY     PORTACATH PLACEMENT Left 07/10/2019   Procedure: INSERTION PORT-A-CATH;  Surgeon: Leafy Ro, MD;  Location: [facility] ORS;  Service: General;  Laterality: Left;   SIMPLE MASTECTOMY WITH AXILLARY SENTINEL NODE  BIOPSY Left 07/10/2019   Procedure: SIMPLE MASTECTOMY.; LEFT;  Surgeon: Leafy Ro, MD;  Location: [facility] ORS;  Service: General;  Laterality: Left;   THYROIDECTOMY, PARTIAL     TRIGGER FINGER RELEASE     rigt hand thumb and pointer finger    OB History     Gravida  1   Para  1   Term      Preterm      AB      Living  1      SAB      IAB      Ectopic      Multiple      Live Births           Obstetric Comments  Menstrual age: 38  Age 1st Pregnancy: 57            Home Medications    Prior to Admission medications   Medication Sig Start Date End Date Taking? Authorizing Provider  acetaminophen (TYLENOL) 500 MG tablet Take 500 mg by mouth every 6 (six) hours as needed (for pain/headaches.).    [provider]  anastrozole (ARIMIDEX) 1 MG tablet TAKE 1 TABLET BY MOUTH EVERY DAY 11/19/22   Creig Hines, MD  aspirin EC 81 MG tablet Take 81 mg by mouth daily.    [provider]  atorvastatin (LIPITOR) 10 MG tablet Take 10 mg by mouth daily.     [provider]  benzonatate (TESSALON) 100 MG capsule Take 2 capsules (200 mg total) by mouth every 8 (eight) hours. 02/14/23   Becky Augusta, NP  Calcium Carbonate-Vitamin D 600-400 MG-UNIT tablet Take 2 tablets by mouth daily.     [provider]  DUPILUMAB [place] Inject into the skin.    [provider]  levofloxacin (LEVAQUIN) 500 MG tablet Take 1 tablet (500 mg total) by mouth daily. 02/14/23   Becky Augusta, NP  lisinopril-hydrochlorothiazide (PRINZIDE,ZESTORETIC) 20-12.5 MG tablet Take 1 tablet by mouth daily.    [provider]  metFORMIN (GLUCOPHAGE-XR) 750 MG 24 hr tablet Take 1,500 mg by mouth daily. 07/29/21   [provider]  Multiple Vitamin (MULTIVITAMIN WITH MINERALS) TABS tablet Take 1 tablet by mouth daily after breakfast. Centrum Silver    [provider]  promethazine-dextromethorphan (PROMETHAZINE-DM) 6.25-15 MG/5ML syrup Take 5 mLs by  mouth 4 (four) times daily as needed. 02/14/23   Becky Augusta, NP  triamcinolone cream (KENALOG) 0.1 % Apply 1 Application topically 2 (two) times daily. 06/19/22   [provider]    Family History Family History  Problem Relation Age of Onset   Breast cancer Cousin        mat cousin   Hypertension Mother    Heart attack Father    Prostate cancer Father    Diabetes Father    Colon polyps Father     Social History Social History  Tobacco Use   Smoking status: Never   Smokeless tobacco: Never  Vaping Use   Vaping status: Never Used  Substance Use Topics   Alcohol use: Not Currently   Drug use: No     Allergies   Shellfish allergy and Sulfa antibiotics   Review of Systems Review of Systems  Constitutional:  Positive for fatigue. Negative for chills and fever.  HENT:  Negative for ear pain and sore throat.   Eyes:  Negative for pain and visual disturbance.  Respiratory:  Positive for cough. Negative for shortness of breath.   Cardiovascular:  Negative for chest pain and palpitations.  Gastrointestinal:  Negative for abdominal pain and vomiting.  Genitourinary:  Negative for dysuria and hematuria.  Musculoskeletal:  Negative for arthralgias and back pain.  Skin:  Negative for color change and rash.  Neurological:  Negative for seizures and syncope.  All other systems reviewed and are negative.    Physical Exam Triage Vital Signs ED Triage Vitals  Encounter Vitals Group     BP 07/25/23 0900 127/66     Systolic BP Percentile --      Diastolic BP Percentile --      Pulse Rate 07/25/23 0900 77     Resp 07/25/23 0900 14     Temp 07/25/23 0900 99.1 F (37.3 C)     Temp Source 07/25/23 0900 Oral     SpO2 07/25/23 0900 96 %     Weight 07/25/23 0858 178 lb 12.7 oz (81.1 kg)     Height 07/25/23 0858 5\' 1"  (1.549 m)     Head Circumference --      Peak Flow --      Pain Score 07/25/23 0858 4     Pain Loc --      Pain Education --      Exclude from Growth  Chart --    No data found.  Updated Vital Signs BP 127/66 (BP Location: Right Arm)   Pulse 77   Temp 99.1 F (37.3 C) (Oral)   Resp 14   Ht 5\' 1"  (1.549 m)   Wt 178 lb 12.7 oz (81.1 kg)   SpO2 96%   BMI 33.78 kg/m   Visual Acuity Right Eye Distance:   Left Eye Distance:   Bilateral Distance:    Right Eye Near:   Left Eye Near:    Bilateral Near:     Physical Exam Vitals and nursing note reviewed.  Constitutional:      General: She is not in acute distress.    Appearance: She is well-developed.  HENT:     Head: Normocephalic and atraumatic.     Right Ear: Tympanic membrane normal.     Left Ear: Tympanic membrane normal.     Nose: Nose normal.     Mouth/Throat:     Mouth: Mucous membranes are moist.  Eyes:     Conjunctiva/sclera: Conjunctivae normal.  Cardiovascular:     Rate and Rhythm: Normal rate and regular rhythm.     Heart sounds: No murmur heard. Pulmonary:     Effort: Pulmonary effort is normal. No respiratory distress.     Breath sounds: Normal breath sounds.  Abdominal:     Palpations: Abdomen is soft.     Tenderness: There is no abdominal tenderness.  Musculoskeletal:        General: No swelling.     Cervical back: Neck supple.  Skin:    General: Skin is warm and dry.  Capillary Refill: Capillary refill takes less than 2 seconds.  Neurological:     Mental Status: She is alert.  Psychiatric:        Mood and Affect: Mood normal.      UC Treatments / Results  Labs (all labs ordered are listed, but only abnormal results are displayed) Labs Reviewed  GROUP A STREP BY PCR    EKG   Radiology No results found.  Procedures Procedures (including critical care time)  Medications Ordered in UC Medications - No data to display  Initial Impression / Assessment and Plan / UC Course  I have reviewed the triage vital signs and the nursing notes.  Pertinent labs & imaging results that were available during my care of the patient were  reviewed by me and considered in my medical decision making (see chart for details).     Post COVID symptoms: Strep test was negative today.  Her symptoms are likely residual symptoms from COVID.  The patient has Promethazine DM at home and says she may use this but would prefer to not use medication if possible.  Advise she stay hydrated and rest when she needs to.  Follow-up with urgent care if her symptoms worsen or fail to improve. Final Clinical Impressions(s) / UC Diagnoses   Final diagnoses:  None   Discharge Instructions   None    ED Prescriptions   None    PDMP not reviewed this encounter.   Landis Martins, [place] 07/25/23 1028

## 2023-07-25 NOTE — ED Triage Notes (Signed)
Patient c/o cough and chest congestion that started 2 weeks ago.  Patient was diagnosed with COVID last month.  Patient denies fevers. Patient reports ongoing sore throat on the right side.

## 2023-07-25 NOTE — Discharge Instructions (Addendum)
Rest when you need to. Light exercise. May use promethazine-dm that was previously prescribe. Lozenges for sore throat. Mucinex as needed for congestion.

## 2023-08-24 ENCOUNTER — Encounter: Payer: Self-pay | Admitting: Oncology

## 2023-08-24 ENCOUNTER — Inpatient Hospital Stay: Payer: Medicare Other | Attending: Oncology | Admitting: Oncology

## 2023-08-24 VITALS — BP 140/50 | HR 79 | Temp 98.5°F | Resp 16 | Ht 61.0 in | Wt 189.2 lb

## 2023-08-24 DIAGNOSIS — Z9012 Acquired absence of left breast and nipple: Secondary | ICD-10-CM | POA: Diagnosis not present

## 2023-08-24 DIAGNOSIS — Z09 Encounter for follow-up examination after completed treatment for conditions other than malignant neoplasm: Secondary | ICD-10-CM | POA: Diagnosis not present

## 2023-08-24 DIAGNOSIS — Z923 Personal history of irradiation: Secondary | ICD-10-CM | POA: Insufficient documentation

## 2023-08-24 DIAGNOSIS — Z79899 Other long term (current) drug therapy: Secondary | ICD-10-CM

## 2023-08-24 DIAGNOSIS — Z170 Estrogen receptor positive status [ER+]: Secondary | ICD-10-CM | POA: Insufficient documentation

## 2023-08-24 DIAGNOSIS — Z7982 Long term (current) use of aspirin: Secondary | ICD-10-CM | POA: Insufficient documentation

## 2023-08-24 DIAGNOSIS — Z79811 Long term (current) use of aromatase inhibitors: Secondary | ICD-10-CM | POA: Insufficient documentation

## 2023-08-24 DIAGNOSIS — Z5181 Encounter for therapeutic drug level monitoring: Secondary | ICD-10-CM

## 2023-08-24 DIAGNOSIS — C50412 Malignant neoplasm of upper-outer quadrant of left female breast: Secondary | ICD-10-CM | POA: Diagnosis not present

## 2023-08-24 NOTE — Progress Notes (Signed)
Hematology/Oncology Consult note [facility]  Telephone:([phone] [phone]  Patient Care Team: Mick Sell, MD as PCP - General (Infectious Diseases) Creig Hines, MD as Consulting Physician (Oncology) Lemar Livings, Merrily Pew, MD as Consulting Physician (General Surgery) Jim Like, RN as Oncology Nurse Navigator Carmina Miller, MD as Referring Physician (Radiation Oncology)   Name of the patient: Amanda Wade  [id number]  [birth year]   Date of visit: 08/24/23  Diagnosis-  pathological prognostic stage IA [id number] mpN1a Invasive mammary carcinoma of the left breast     Chief complaint/ Reason for visit-routine follow-up of breast cancer  Heme/Onc history: Patient is a 72 year old female diagnosed with stage I left breast cancer in June 2020.  She was found to have multifocal disease grade 1 ER/PR greater than 90% positive and HER2 negative.Patient was taken for lumpectomy and sentinel lymph node biopsy by Dr. Lemar Livings on 06/05/2019.Final pathology showed invasive mammary carcinoma grade 1 ER PR positive and HER-2/neu negative at 4 different biopsy sites with extensive DCIS predominantly micropapillary type.  One lymph node was also involved with macro metastatic carcinoma and was negative for extranodal extension. Patient's case was discussed at tumor board and there was a concern that there may be potentially more areas of abnormality given extensive DCIS and invasive cancer seen in multiple foci in the lumpectomy specimen.  Plan was to proceed with bilateral MRI.Breast MRI showed a large area of clumped discontinued non-mass enhancement in the left upper breast spanning an area of 9.8 cm in AP dimension suspicious for residual DCIS or invasive carcinoma.  Patient therefore proceeded with r left mastectomy which showed small area of residual DCIS but no evidence of invasive malignancy. Patient also had a MammaPrint testing done on her lumpectomy  specimen which came back as high risk group.  29% chance of recurrence without chemotherapy at 5 years.  Greater than 12% chemo benefit and estimated > 93% survival at 5 years with chemotherapy and hormone therapy patient received adjuvant TC chemotherapy as well as adjuvant radiation treatment.  She started taking Arimidex in February 2021. Marland Kitchen  She also completed adjuvant Zometa    Interval history-tolerating Arimidex well without any significant side effects.  Appetite and weight have remained stable.  Denies any new aches and pains anywhere.  Denies any breast concerns at this time  ECOG PS- 1 Pain scale- 0   Review of systems- Review of Systems  Constitutional:  Negative for chills, fever, malaise/fatigue and weight loss.  HENT:  Negative for congestion, ear discharge and nosebleeds.   Eyes:  Negative for blurred vision.  Respiratory:  Negative for cough, hemoptysis, sputum production, shortness of breath and wheezing.   Cardiovascular:  Negative for chest pain, palpitations, orthopnea and claudication.  Gastrointestinal:  Negative for abdominal pain, blood in stool, constipation, diarrhea, heartburn, melena, nausea and vomiting.  Genitourinary:  Negative for dysuria, flank pain, frequency, hematuria and urgency.  Musculoskeletal:  Negative for back pain, joint pain and myalgias.  Skin:  Negative for rash.  Neurological:  Negative for dizziness, tingling, focal weakness, seizures, weakness and headaches.  Endo/Heme/Allergies:  Does not bruise/bleed easily.  Psychiatric/Behavioral:  Negative for depression and suicidal ideas. The patient does not have insomnia.       Allergies  Allergen Reactions   Shellfish Allergy Rash   Sulfa Antibiotics Rash     Past Medical History:  Diagnosis Date   Breast cancer (HCC)    left breast   Chronic renal insufficiency,  stage 2 (mild) 04/08/2017   EGFR 55 on 04/08/2017, 62 10/13/2018   Complication of anesthesia    Elevated LFTs 08/07/2021    Headache    Hyperlipidemia    Hypertension    Malignant neoplasm of upper-outer quadrant of left breast in female, estrogen receptor positive (HCC) 05/19/2019   Personal history of chemotherapy    Personal history of radiation therapy    PONV (postoperative nausea and vomiting) 1987   Pre-diabetes    Severe obesity with body mass index (BMI) of 35.0 to 39.9 with comorbidity (HCC) 10/11/2015   Trigger finger of right hand    and thumb   Vertigo    intermittent especially lying on left side     Past Surgical History:  Procedure Laterality Date   ABDOMINAL HYSTERECTOMY  2004   complete   BREAST BIOPSY Left 05/05/2019   [facility] ribbon clip   BREAST BIOPSY Left 05/16/2019   [facility]   BREAST BIOPSY Left 05/16/2019   [facility]- [facility] clip   BREAST BIOPSY WITH SENTINEL LYMPH NODE BIOPSY AND NEEDLE LOCALIZATION Left 06/05/2019   Procedure: BREAST BIOPSY WITH SENTINEL LYMPH NODE BIOPSY AND NEEDLE LOCALIZATION, LEFT, BRACKETING WIRE;  Surgeon: Earline Mayotte, MD;  Location: [facility] ORS;  Service: General;  Laterality: Left;   BREAST CYST ASPIRATION Bilateral age 31   neg   BREAST LUMPECTOMY Left 05/2019   positive   CHOLECYSTECTOMY     COLONOSCOPY WITH PROPOFOL N/A 08/02/2018   Procedure: COLONOSCOPY WITH PROPOFOL;  Surgeon: Christena Deem, MD;  Location: [facility] ENDOSCOPY;  Service: Endoscopy;  Laterality: N/A;   COLONOSCOPY WITH PROPOFOL N/A 12/01/2021   Procedure: COLONOSCOPY WITH PROPOFOL;  Surgeon: Toney Reil, MD;  Location: [facility] ENDOSCOPY;  Service: Gastroenterology;  Laterality: N/A;   DILATION AND CURETTAGE OF UTERUS     goiter surgery     MASTECTOMY Left 07/10/2019   OOPHORECTOMY     PORTACATH PLACEMENT Left 07/10/2019   Procedure: INSERTION PORT-A-CATH;  Surgeon: Leafy Ro, MD;  Location: [facility] ORS;  Service: General;  Laterality: Left;   SIMPLE MASTECTOMY WITH AXILLARY SENTINEL NODE BIOPSY Left 07/10/2019   Procedure: SIMPLE MASTECTOMY.; LEFT;  Surgeon: Leafy Ro, MD;   Location: [facility] ORS;  Service: General;  Laterality: Left;   THYROIDECTOMY, PARTIAL     TRIGGER FINGER RELEASE     rigt hand thumb and pointer finger    Social History   Socioeconomic History   Marital status: Divorced    Spouse name: Not on file   Number of children: Not on file   Years of education: Not on file   Highest education level: Not on file  Occupational History   Not on file  Tobacco Use   Smoking status: Never   Smokeless tobacco: Never  Vaping Use   Vaping status: Never Used  Substance and Sexual Activity   Alcohol use: Not Currently   Drug use: No   Sexual activity: Not Currently  Other Topics Concern   Not on file  Social History Narrative   Not on file   Social Determinants of Health   Financial Resource Strain: Low Risk  (07/05/2023)   Received from [facility]   Overall Financial Resource Strain (CARDIA)    Difficulty of Paying Living Expenses: Not hard at all  Food Insecurity: No Food Insecurity (07/05/2023)   [facility] from [facility]   Hunger Vital Sign    Worried About Running Out of Food in the Last Year: Never  true    Ran Out of Food in the Last Year: Never true  Transportation Needs: No Transportation Needs (07/05/2023)   Received from [facility] - Transportation    In the past 12 months, has lack of transportation kept you from medical appointments or from getting medications?: No    Lack of Transportation (Non-Medical): No  Physical Activity: Not on file  Stress: Not on file  Social Connections: Not on file  Intimate Partner Violence: Not on file    Family History  Problem Relation Age of Onset   Breast cancer Cousin        mat cousin   Hypertension Mother    Heart attack Father    Prostate cancer Father    Diabetes Father    Colon polyps Father      Current Outpatient Medications:    acetaminophen (TYLENOL) 500 MG tablet, Take 500 mg by mouth every 6 (six) hours  as needed (for pain/headaches.)., Disp: , Rfl:    anastrozole (ARIMIDEX) 1 MG tablet, TAKE 1 TABLET BY MOUTH EVERY DAY, Disp: 90 tablet, Rfl: 3   aspirin EC 81 MG tablet, Take 81 mg by mouth daily., Disp: , Rfl:    atorvastatin (LIPITOR) 10 MG tablet, Take 10 mg by mouth daily. , Disp: , Rfl:    Calcium Carbonate-Vitamin D 600-400 MG-UNIT tablet, Take 2 tablets by mouth daily. , Disp: , Rfl:    DUPILUMAB [place], Inject into the skin., Disp: , Rfl:    lisinopril-hydrochlorothiazide (PRINZIDE,ZESTORETIC) 20-12.5 MG tablet, Take 1 tablet by mouth daily., Disp: , Rfl:    metFORMIN (GLUCOPHAGE-XR) 750 MG 24 hr tablet, Take 1,500 mg by mouth daily., Disp: , Rfl:    Multiple Vitamin (MULTIVITAMIN WITH MINERALS) TABS tablet, Take 1 tablet by mouth daily after breakfast. Centrum Silver, Disp: , Rfl:    triamcinolone cream (KENALOG) 0.1 %, Apply 1 Application topically 2 (two) times daily., Disp: , Rfl:    benzonatate (TESSALON) 100 MG capsule, Take 2 capsules (200 mg total) by mouth every 8 (eight) hours., Disp: 21 capsule, Rfl: 0   levofloxacin (LEVAQUIN) 500 MG tablet, Take 1 tablet (500 mg total) by mouth daily., Disp: 7 tablet, Rfl: 0   promethazine-dextromethorphan (PROMETHAZINE-DM) 6.25-15 MG/5ML syrup, Take 5 mLs by mouth 4 (four) times daily as needed. (Patient not taking: Reported on 08/24/2023), Disp: 118 mL, Rfl: 0  Physical exam:  Vitals:   08/24/23 1152  BP: (!) 140/50  Pulse: 79  Resp: 16  Temp: 98.5 F (36.9 C)  TempSrc: Tympanic  SpO2: 96%  Weight: 189 lb 3.2 oz (85.8 kg)  Height: 5\' 1"  (1.549 m)   Physical Exam Cardiovascular:     Rate and Rhythm: Normal rate and regular rhythm.     Heart sounds: Normal heart sounds.  Pulmonary:     Effort: Pulmonary effort is normal.     Breath sounds: Normal breath sounds.  Abdominal:     General: Bowel sounds are normal.     Palpations: Abdomen is soft.  Skin:    General: Skin is warm and dry.  Neurological:     Mental Status: She is  alert and oriented to person, place, and time.   Breast exam: No palpable masses in the right breast.  No palpable bilateral axillary adenopathy.  Patient is s/p left mastectomy without reconstruction and no evidence of chest wall recurrence.     Latest Ref Rng & Units 02/19/2023    9:16 AM  CMP  Glucose 70 - 99 mg/dL 454   BUN 8 - 23 mg/dL 17   Creatinine 0.98 - 1.00 mg/dL 1.19   Sodium 147 - 829 mmol/L 135   Potassium 3.5 - 5.1 mmol/L 3.9   Chloride 98 - 111 mmol/L 98   CO2 22 - 32 mmol/L 28   Calcium 8.9 - 10.3 mg/dL 9.5   Total Protein 6.5 - 8.1 g/dL 7.7   Total Bilirubin 0.3 - 1.2 mg/dL 0.3   Alkaline Phos 38 - 126 U/L 72   AST 15 - 41 U/L 26   ALT 0 - 44 U/L 17       Latest Ref Rng & Units 02/19/2023    9:16 AM  CBC  WBC 4.0 - 10.5 K/uL 10.6   Hemoglobin 12.0 - 15.0 g/dL 56.2   Hematocrit 13.0 - 46.0 % 33.1   Platelets 150 - 400 K/uL 440     No images are attached to the encounter.  No results found.   Assessment and plan- Patient is a 72 y.o. female with history of stage I left breast cancer ER/PR positive HER2 negative status post left mastectomy adjuvant chemotherapy and radiation treatment currently on Arimidex and this is a routine follow-up visit  Patient will be completing 4 years of Arimidex in February 2024.  Plan is to take it for total of 10 years.  Clinically she is doing well with no concerning signs and symptoms of recurrence based on today's exam.  Her right breast mammogram from June 2024 was unremarkable.  I will see her back in 6 months no labs and get a bone density scan prior.  Her baseline bone density scan has been normal   Visit Diagnosis 1. Malignant neoplasm of upper-outer quadrant of left breast in female, estrogen receptor positive (HCC)   2. High risk medication use   3. Follow-up exam after completing high-risk medication   4. Visit for monitoring Arimidex therapy      Dr. Owens Shark, MD, MPH [facility] [phone] 08/24/2023 2:42 PM

## 2023-10-09 ENCOUNTER — Encounter: Payer: Self-pay | Admitting: Oncology

## 2023-11-02 ENCOUNTER — Other Ambulatory Visit: Payer: Self-pay | Admitting: Oncology

## 2023-11-03 ENCOUNTER — Other Ambulatory Visit: Payer: Medicare Other

## 2023-11-03 ENCOUNTER — Ambulatory Visit
Admission: RE | Admit: 2023-11-03 | Discharge: 2023-11-03 | Disposition: A | Discharge status: Home / Self Care | Payer: Medicare Other | Source: Ambulatory Visit | Attending: Oncology | Admitting: Oncology

## 2023-11-03 DIAGNOSIS — Z09 Encounter for follow-up examination after completed treatment for conditions other than malignant neoplasm: Secondary | ICD-10-CM | POA: Insufficient documentation

## 2023-11-03 DIAGNOSIS — Z79899 Other long term (current) drug therapy: Secondary | ICD-10-CM | POA: Diagnosis present

## 2023-11-03 DIAGNOSIS — Z780 Asymptomatic menopausal state: Secondary | ICD-10-CM | POA: Insufficient documentation

## 2023-11-03 DIAGNOSIS — C50412 Malignant neoplasm of upper-outer quadrant of left female breast: Secondary | ICD-10-CM | POA: Diagnosis present

## 2023-11-03 DIAGNOSIS — Z170 Estrogen receptor positive status [ER+]: Secondary | ICD-10-CM | POA: Diagnosis present

## 2024-02-07 ENCOUNTER — Other Ambulatory Visit: Payer: Self-pay

## 2024-02-08 ENCOUNTER — Ambulatory Visit (INDEPENDENT_AMBULATORY_CARE_PROVIDER_SITE_OTHER): Payer: Medicare Other | Admitting: Gastroenterology

## 2024-02-08 ENCOUNTER — Other Ambulatory Visit: Payer: Self-pay

## 2024-02-08 ENCOUNTER — Encounter: Payer: Self-pay | Admitting: Gastroenterology

## 2024-02-08 VITALS — BP 162/80 | HR 103 | Temp 97.7°F | Ht 61.0 in | Wt 192.1 lb

## 2024-02-08 DIAGNOSIS — R768 Other specified abnormal immunological findings in serum: Secondary | ICD-10-CM

## 2024-02-08 DIAGNOSIS — K7581 Nonalcoholic steatohepatitis (NASH): Secondary | ICD-10-CM | POA: Diagnosis not present

## 2024-02-08 DIAGNOSIS — Z860101 Personal history of adenomatous and serrated colon polyps: Secondary | ICD-10-CM | POA: Diagnosis not present

## 2024-02-08 NOTE — Progress Notes (Signed)
 Arlyss Repress, MD [address]  [address]  [place], [place] [address]  Main: [phone]  Fax: [phone]    Gastroenterology Consultation  Referring Provider:     Mick Sell, MD Primary Care Physician:  Mick Sell, MD Primary Gastroenterologist:  Dr. Arlyss Repress Reason for Consultation:  MASH        HPI:   Amanda Wade is a 73 y.o. female referred by Dr. Sampson Goon Stann Mainland, MD  for consultation & management of elevated LFTs.  Patient has history of breast cancer stage I, ER positive HER2 negative status postlumpectomy, s/p adjuvant chemo and radiation.  Patient is currently maintained on Arimidex with calcium and vitamin D supplementation.  Patient is referred to me for worsening LFTs.  She is first noticed to have mildly elevated AST of 49 in 6/21, have been progressively worsening since then most recently AST 76, ALT 58, normal ALT and phosphatase and T bili.  She underwent ultrasound which revealed fatty liver.  And status postcholecystectomy without any biliary dilatation.  Patient also has history of diabetes, hemoglobin A1c was 8, started on metformin.  Patient has cut back on carbohydrates, started walking and she lost about 10 pounds within last 3 months and lost about 15 pounds within last 6 months.  Her hemoglobin A1c is improving.  Patient does not have any other GI symptoms  Follow-up visit 02/16/2023 Patient is here for follow-up of fatty liver.  She is doing very well.  She just returned from [place].  She had mild pneumonia for which she received antibiotics.  Recovered well.  Her LFTs from 12/2022 were normal.  She lost about 40 pounds last year and planning to lose more weight this year.  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  Her most recent LFTs in February were normal.  She is trying to eat healthy.  Within last year, she regained about 10 pounds, she does not have any GI concerns today.   Her hemoglobin A1c is maintained around 7  Patient does not smoke or drink alcohol     Latest Ref Rng & Units 02/19/2023    9:16 AM 08/21/2022    9:30 AM 02/18/2022    8:51 AM  Hepatic Function  Total Protein 6.5 - 8.1 g/dL 7.7  7.3  7.4   Albumin 3.5 - 5.0 g/dL 3.6  3.8  4.0   AST 15 - 41 U/L 26  24  26    ALT 0 - 44 U/L 17  18  20    Alk Phosphatase 38 - 126 U/L 72  81  69   Total Bilirubin 0.3 - 1.2 mg/dL 0.3  0.4  0.2     NSAIDs: None  Antiplts/Anticoagulants/Anti thrombotics: None  GI Procedures:  Colonoscopy 12/01/2021 normal, adequate prep  Colonoscopy 08/02/2018 - Preparation of the colon was fair. - Diverticulosis in the sigmoid colon, in the descending colon, in the transverse colon, in the distal transverse colon and in the ascending colon. - One 3 mm polyp in the transverse colon, removed with a cold biopsy forceps. Resected and retrieved. - Two 3 mm polyps at the hepatic flexure, removed with a cold biopsy forceps. Resected and retrieved. - The distal rectum and anal verge are normal on retroflexion view.  DIAGNOSIS:  A.  COLON POLYP, TRANSVERSE; COLD BIOPSY:  - TUBULAR ADENOMA.  - NEGATIVE FOR HIGH-GRADE DYSPLASIA AND MALIGNANCY.   B.  COLON POLYPS  X 2, HEPATIC FLEXURE; COLD BIOPSY:  - TUBULAR ADENOMAS, 5 FRAGMENTS.  - NEGATIVE FOR HIGH-GRADE DYSPLASIA AND MALIGNANCY.   Past Medical History:  Diagnosis Date   Breast cancer (HCC)    left breast   Chronic renal insufficiency, stage 2 (mild) 04/08/2017   EGFR 55 on 04/08/2017, 62 10/13/2018   Complication of anesthesia    Elevated LFTs 08/07/2021   Headache    Hyperlipidemia    Hypertension    Malignant neoplasm of upper-outer quadrant of left breast in female, estrogen receptor positive (HCC) 05/19/2019   Personal history of chemotherapy    Personal history of radiation therapy    PONV (postoperative nausea and vomiting) 1987   Pre-diabetes    Severe obesity with body mass index (BMI) of 35.0 to 39.9 with  comorbidity (HCC) 10/11/2015   Trigger finger of right hand    and thumb   Vertigo    intermittent especially lying on left side    Past Surgical History:  Procedure Laterality Date   ABDOMINAL HYSTERECTOMY  2004   complete   BREAST BIOPSY Left 05/05/2019   [facility]- ribbon clip   BREAST BIOPSY Left 05/16/2019   [facility]-DCIS   BREAST BIOPSY Left 05/16/2019   [facility]- [facility] clip   BREAST BIOPSY WITH SENTINEL LYMPH NODE BIOPSY AND NEEDLE LOCALIZATION Left 06/05/2019   Procedure: BREAST BIOPSY WITH SENTINEL LYMPH NODE BIOPSY AND NEEDLE LOCALIZATION, LEFT, BRACKETING WIRE;  Surgeon: Earline Mayotte, MD;  Location: [facility] ORS;  Service: General;  Laterality: Left;   BREAST CYST ASPIRATION Bilateral age 33   neg   BREAST LUMPECTOMY Left 05/2019   positive   CHOLECYSTECTOMY     COLONOSCOPY WITH PROPOFOL N/A 08/02/2018   Procedure: COLONOSCOPY WITH PROPOFOL;  Surgeon: Christena Deem, MD;  Location: [facility] ENDOSCOPY;  Service: Endoscopy;  Laterality: N/A;   COLONOSCOPY WITH PROPOFOL N/A 12/01/2021   Procedure: COLONOSCOPY WITH PROPOFOL;  Surgeon: Toney Reil, MD;  Location: [facility] ENDOSCOPY;  Service: Gastroenterology;  Laterality: N/A;   DILATION AND CURETTAGE OF UTERUS     goiter surgery     MASTECTOMY Left 07/10/2019   OOPHORECTOMY     PORTACATH PLACEMENT Left 07/10/2019   Procedure: INSERTION PORT-A-CATH;  Surgeon: Leafy Ro, MD;  Location: [facility] ORS;  Service: General;  Laterality: Left;   SIMPLE MASTECTOMY WITH AXILLARY SENTINEL NODE BIOPSY Left 07/10/2019   Procedure: SIMPLE MASTECTOMY.; LEFT;  Surgeon: Leafy Ro, MD;  Location: [facility] ORS;  Service: General;  Laterality: Left;   THYROIDECTOMY, PARTIAL     TRIGGER FINGER RELEASE     rigt hand thumb and pointer finger    Current Outpatient Medications:    acetaminophen (TYLENOL) 500 MG tablet, Take 500 mg by mouth every 6 (six) hours as needed (for pain/headaches.)., Disp: , Rfl:    anastrozole (ARIMIDEX) 1 MG tablet, TAKE 1 TABLET BY  MOUTH EVERY DAY, Disp: 90 tablet, Rfl: 3   aspirin EC 81 MG tablet, Take 81 mg by mouth daily., Disp: , Rfl:    atorvastatin (LIPITOR) 10 MG tablet, Take 10 mg by mouth daily. , Disp: , Rfl:    Calcium Carbonate-Vitamin D 600-400 MG-UNIT tablet, Take 2 tablets by mouth daily. , Disp: , Rfl:    Dupilumab (DUPIXENT) 300 MG/2ML SOPN, Inject 300 mg into the skin., Disp: , Rfl:    lisinopril-hydrochlorothiazide (PRINZIDE,ZESTORETIC) 20-12.5 MG tablet, Take 1 tablet by mouth daily., Disp: , Rfl:    metFORMIN (GLUCOPHAGE-XR) 750 MG 24 hr tablet, Take 1,500 mg  by mouth daily., Disp: , Rfl:    Multiple Vitamin (MULTIVITAMIN WITH MINERALS) TABS tablet, Take 1 tablet by mouth daily after breakfast. Centrum Silver, Disp: , Rfl:     Family History  Problem Relation Age of Onset   Breast cancer Cousin        mat cousin   Hypertension Mother    Heart attack Father    Prostate cancer Father    Diabetes Father    Colon polyps Father      Social History   Tobacco Use   Smoking status: Never   Smokeless tobacco: Never  Vaping Use   Vaping status: Never Used  Substance Use Topics   Alcohol use: Not Currently   Drug use: No    Allergies as of 02/08/2024 - Review Complete 02/08/2024  Allergen Reaction Noted   Shellfish allergy Rash 10/13/2018   Sulfa antibiotics Rash 07/31/2017    Review of Systems:    All systems reviewed and negative except where noted in HPI.   Physical Exam:  BP (!) 162/80 (BP Location: Right Arm, Patient Position: Sitting, Cuff Size: Large)   Pulse (!) 103   Temp 97.7 F (36.5 C) (Oral)   Ht 5\' 1"  (1.549 m)   Wt 192 lb 2 oz (87.1 kg)   BMI 36.30 kg/m  No LMP recorded. Patient has had a hysterectomy.  General:   Alert,  Well-developed, well-nourished, pleasant and cooperative in NAD Head:  Normocephalic and atraumatic. Eyes:  Sclera clear, no icterus.   Conjunctiva pink. Ears:  Normal auditory acuity. Nose:  No deformity, discharge, or lesions. Mouth:  No  deformity or lesions,oropharynx pink & moist. Neck:  Supple; no masses or thyromegaly. Lungs:  Respirations even and unlabored.  Clear throughout to auscultation.   No wheezes, crackles, or rhonchi. No acute distress. Heart:  Regular rate and rhythm; no murmurs, clicks, rubs, or gallops. Abdomen:  Normal bowel sounds. Soft, obese, non-tender and non-distended without masses, hepatosplenomegaly or hernias noted.  No guarding or rebound tenderness.   Rectal: Not performed Msk:  Symmetrical without gross deformities. Good, equal movement & strength bilaterally. Pulses:  Normal pulses noted. Extremities:  No clubbing or edema.  No cyanosis. Neurologic:  Alert and oriented x3;  grossly normal neurologically. Skin:  Intact without significant lesions or rashes. No jaundice. Psych:  Alert and cooperative. Normal mood and affect.  Imaging Studies: Reviewed  Assessment and Plan:   Amanda Wade is a 73 y.o. pleasant Caucasian female with history of metabolic syndrome, stage I breast cancer status postlumpectomy, adjuvant chemo and radiation, currently maintained on Arimidex, s/p cholecystectomy is seen in consultation for elevated LFTs, fatty liver   NASH fibrosis: Biopsy-proven: LFTs have been normal since 12/22 secondary liver disease work-up, revealed elevated antimitochondrial antibodies Underwent liver biopsy in 12/22 which revealed severe steatosis with features of steatohepatitis, stage II fibrosis, single small portal epithelioid granuloma.  QuantiFERON gold was negative. continue metformin Nash fibrosis panel in 07/2022 revealed no evidence of fibrosis, moderate to severe steatosis, moderate Nash Repeat Nash fibrosis panel in 01/2023 did not reveal any fibrosis Continue healthy diet and walk every day about 5000 steps daily  Personal history of tubular adenomas Recommend surveillance colonoscopy in 11/2026 Do not recommend stool based screening tests for colon cancer screening in the  interim pain   Follow up annually or sooner if needed   Arlyss Repress, MD

## 2024-02-22 ENCOUNTER — Encounter: Payer: Self-pay | Admitting: Oncology

## 2024-02-22 ENCOUNTER — Inpatient Hospital Stay: Payer: Medicare Other | Attending: Oncology | Admitting: Oncology

## 2024-02-22 VITALS — BP 140/54 | HR 82 | Temp 98.0°F | Resp 18 | Ht 61.0 in | Wt 193.5 lb

## 2024-02-22 DIAGNOSIS — Z79811 Long term (current) use of aromatase inhibitors: Secondary | ICD-10-CM | POA: Diagnosis not present

## 2024-02-22 DIAGNOSIS — Z853 Personal history of malignant neoplasm of breast: Secondary | ICD-10-CM

## 2024-02-22 DIAGNOSIS — C50912 Malignant neoplasm of unspecified site of left female breast: Secondary | ICD-10-CM | POA: Diagnosis present

## 2024-02-22 DIAGNOSIS — Z9221 Personal history of antineoplastic chemotherapy: Secondary | ICD-10-CM | POA: Diagnosis not present

## 2024-02-22 DIAGNOSIS — Z170 Estrogen receptor positive status [ER+]: Secondary | ICD-10-CM | POA: Insufficient documentation

## 2024-02-22 DIAGNOSIS — Z5181 Encounter for therapeutic drug level monitoring: Secondary | ICD-10-CM

## 2024-02-22 DIAGNOSIS — Z1231 Encounter for screening mammogram for malignant neoplasm of breast: Secondary | ICD-10-CM

## 2024-02-22 DIAGNOSIS — Z08 Encounter for follow-up examination after completed treatment for malignant neoplasm: Secondary | ICD-10-CM

## 2024-02-22 DIAGNOSIS — Z1732 Human epidermal growth factor receptor 2 negative status: Secondary | ICD-10-CM | POA: Insufficient documentation

## 2024-02-22 DIAGNOSIS — Z79899 Other long term (current) drug therapy: Secondary | ICD-10-CM

## 2024-02-22 DIAGNOSIS — Z1721 Progesterone receptor positive status: Secondary | ICD-10-CM | POA: Diagnosis not present

## 2024-02-22 DIAGNOSIS — Z923 Personal history of irradiation: Secondary | ICD-10-CM | POA: Insufficient documentation

## 2024-02-22 NOTE — Progress Notes (Signed)
 Hematology/Oncology Consult note [facility]  Telephone:([phone] [phone]  Patient Care Team: Mick Sell, MD as PCP - General (Infectious Diseases) Creig Hines, MD as Consulting Physician (Oncology) Lemar Livings, Merrily Pew, MD as Consulting Physician (General Surgery) Jim Like, RN as Oncology Nurse Navigator Carmina Miller, MD as Referring Physician (Radiation Oncology)   Name of the patient: Amanda Wade  [id number]  January 05, 1951   Date of visit: 02/22/24  Diagnosis-  pathological prognostic stage IA [id number] mpN1a Invasive mammary carcinoma of the left breast   Chief complaint/ Reason for visit- routine f/u of breast cancer  Heme/Onc history:  Patient is a 73 year old female diagnosed with stage I left breast cancer in June 2020.  She was found to have multifocal disease grade 1 ER/PR greater than 90% positive and HER2 negative.Patient was taken for lumpectomy and sentinel lymph node biopsy by Dr. Lemar Livings on 06/05/2019.Final pathology showed invasive mammary carcinoma grade 1 ER PR positive and HER-2/neu negative at 4 different biopsy sites with extensive DCIS predominantly micropapillary type.  One lymph node was also involved with macro metastatic carcinoma and was negative for extranodal extension. Patient's case was discussed at tumor board and there was a concern that there may be potentially more areas of abnormality given extensive DCIS and invasive cancer seen in multiple foci in the lumpectomy specimen.  Plan was to proceed with bilateral MRI.Breast MRI showed a large area of clumped discontinued non-mass enhancement in the left upper breast spanning an area of 9.8 cm in AP dimension suspicious for residual DCIS or invasive carcinoma.  Patient therefore proceeded with r left mastectomy which showed small area of residual DCIS but no evidence of invasive malignancy. Patient also had a MammaPrint testing done on her lumpectomy  specimen which came back as high risk group.  29% chance of recurrence without chemotherapy at 5 years.  Greater than 12% chemo benefit and estimated > 93% survival at 5 years with chemotherapy and hormone therapy patient received adjuvant TC chemotherapy as well as adjuvant radiation treatment.  She started taking Arimidex in February 2021. Marland Kitchen  She also completed adjuvant Zometa   Interval history- she is doing well overall. Denies any breast concerns. Denies any new aches and pains anywhere  ECOG PS- 1 Pain scale- 0   Review of systems- Review of Systems  Constitutional:  Negative for chills, fever, malaise/fatigue and weight loss.  HENT:  Negative for congestion, ear discharge and nosebleeds.   Eyes:  Negative for blurred vision.  Respiratory:  Negative for cough, hemoptysis, sputum production, shortness of breath and wheezing.   Cardiovascular:  Negative for chest pain, palpitations, orthopnea and claudication.  Gastrointestinal:  Negative for abdominal pain, blood in stool, constipation, diarrhea, heartburn, melena, nausea and vomiting.  Genitourinary:  Negative for dysuria, flank pain, frequency, hematuria and urgency.  Musculoskeletal:  Negative for back pain, joint pain and myalgias.  Skin:  Negative for rash.  Neurological:  Negative for dizziness, tingling, focal weakness, seizures, weakness and headaches.  Endo/Heme/Allergies:  Does not bruise/bleed easily.  Psychiatric/Behavioral:  Negative for depression and suicidal ideas. The patient does not have insomnia.       Allergies  Allergen Reactions   Shellfish Allergy Rash   Sulfa Antibiotics Rash     Past Medical History:  Diagnosis Date   Breast cancer (HCC)    left breast   Chronic renal insufficiency, stage 2 (mild) 04/08/2017   EGFR 55 on 04/08/2017, 62 10/13/2018   Complication  of anesthesia    Elevated LFTs 08/07/2021   Headache    Hyperlipidemia    Hypertension    Malignant neoplasm of upper-outer quadrant of  left breast in female, estrogen receptor positive (HCC) 05/19/2019   Personal history of chemotherapy    Personal history of radiation therapy    PONV (postoperative nausea and vomiting) 1987   Pre-diabetes    Severe obesity with body mass index (BMI) of 35.0 to 39.9 with comorbidity (HCC) 10/11/2015   Trigger finger of right hand    and thumb   Vertigo    intermittent especially lying on left side     Past Surgical History:  Procedure Laterality Date   ABDOMINAL HYSTERECTOMY  2004   complete   BREAST BIOPSY Left 05/05/2019   [facility]- ribbon clip   BREAST BIOPSY Left 05/16/2019   [facility]-DCIS   BREAST BIOPSY Left 05/16/2019   [facility]- [facility] clip   BREAST BIOPSY WITH SENTINEL LYMPH NODE BIOPSY AND NEEDLE LOCALIZATION Left 06/05/2019   Procedure: BREAST BIOPSY WITH SENTINEL LYMPH NODE BIOPSY AND NEEDLE LOCALIZATION, LEFT, BRACKETING WIRE;  Surgeon: Earline Mayotte, MD;  Location: [facility] ORS;  Service: General;  Laterality: Left;   BREAST CYST ASPIRATION Bilateral age 57   neg   BREAST LUMPECTOMY Left 05/2019   positive   CHOLECYSTECTOMY     COLONOSCOPY WITH PROPOFOL N/A 08/02/2018   Procedure: COLONOSCOPY WITH PROPOFOL;  Surgeon: Christena Deem, MD;  Location: [facility] ENDOSCOPY;  Service: Endoscopy;  Laterality: N/A;   COLONOSCOPY WITH PROPOFOL N/A 12/01/2021   Procedure: COLONOSCOPY WITH PROPOFOL;  Surgeon: Toney Reil, MD;  Location: [facility] ENDOSCOPY;  Service: Gastroenterology;  Laterality: N/A;   DILATION AND CURETTAGE OF UTERUS     goiter surgery     MASTECTOMY Left 07/10/2019   OOPHORECTOMY     PORTACATH PLACEMENT Left 07/10/2019   Procedure: INSERTION PORT-A-CATH;  Surgeon: Leafy Ro, MD;  Location: [facility] ORS;  Service: General;  Laterality: Left;   SIMPLE MASTECTOMY WITH AXILLARY SENTINEL NODE BIOPSY Left 07/10/2019   Procedure: SIMPLE MASTECTOMY.; LEFT;  Surgeon: Leafy Ro, MD;  Location: [facility] ORS;  Service: General;  Laterality: Left;   THYROIDECTOMY, PARTIAL     TRIGGER  FINGER RELEASE     rigt hand thumb and pointer finger    Social History   Socioeconomic History   Marital status: Divorced    Spouse name: Not on file   Number of children: Not on file   Years of education: Not on file   Highest education level: Not on file  Occupational History   Not on file  Tobacco Use   Smoking status: Never   Smokeless tobacco: Never  Vaping Use   Vaping status: Never Used  Substance and Sexual Activity   Alcohol use: Not Currently   Drug use: No   Sexual activity: Not Currently  Other Topics Concern   Not on file  Social History Narrative   Not on file   Social Drivers of Health   Financial Resource Strain: Low Risk  (01/07/2024)   Received from [facility]   Overall Financial Resource Strain (CARDIA)    Difficulty of Paying Living Expenses: Not hard at all  Food Insecurity: No Food Insecurity (01/07/2024)   Received from [facility]   Hunger Vital Sign    Worried About Running Out of Food in the Last Year: Never true    Ran Out of Food in the Last Year: Never true  Transportation Needs: No Transportation Needs (01/07/2024)   Received from [facility] - Transportation    In the past 12 months, has lack of transportation kept you from medical appointments or from getting medications?: No    Lack of Transportation (Non-Medical): No  Physical Activity: Not on file  Stress: Not on file  Social Connections: Not on file  Intimate Partner Violence: Not on file    Family History  Problem Relation Age of Onset   Breast cancer Cousin        mat cousin   Hypertension Mother    Heart attack Father    Prostate cancer Father    Diabetes Father    Colon polyps Father      Current Outpatient Medications:    acetaminophen (TYLENOL) 500 MG tablet, Take 500 mg by mouth every 6 (six) hours as needed (for pain/headaches.)., Disp: , Rfl:    anastrozole (ARIMIDEX) 1 MG tablet, TAKE 1 TABLET BY  MOUTH EVERY DAY, Disp: 90 tablet, Rfl: 3   aspirin EC 81 MG tablet, Take 81 mg by mouth daily., Disp: , Rfl:    atorvastatin (LIPITOR) 10 MG tablet, Take 10 mg by mouth daily. , Disp: , Rfl:    Calcium Carbonate-Vitamin D 600-400 MG-UNIT tablet, Take 2 tablets by mouth daily. , Disp: , Rfl:    Dupilumab (DUPIXENT) 300 MG/2ML SOPN, Inject 300 mg into the skin., Disp: , Rfl:    lisinopril-hydrochlorothiazide (PRINZIDE,ZESTORETIC) 20-12.5 MG tablet, Take 1 tablet by mouth daily., Disp: , Rfl:    metFORMIN (GLUCOPHAGE-XR) 750 MG 24 hr tablet, Take 1,500 mg by mouth daily., Disp: , Rfl:    Multiple Vitamin (MULTIVITAMIN WITH MINERALS) TABS tablet, Take 1 tablet by mouth daily after breakfast. Centrum Silver, Disp: , Rfl:   Physical exam:  Vitals:   02/22/24 1038  BP: (!) 140/54  Pulse: 82  Resp: 18  Temp: 98 F (36.7 C)  TempSrc: Tympanic  SpO2: 97%  Weight: 193 lb 8 oz (87.8 kg)  Height: 5\' 1"  (1.549 m)   Physical Exam Cardiovascular:     Rate and Rhythm: Normal rate and regular rhythm.     Heart sounds: Normal heart sounds.  Pulmonary:     Effort: Pulmonary effort is normal.     Breath sounds: Normal breath sounds.  Skin:    General: Skin is warm and dry.  Neurological:     Mental Status: She is alert and oriented to person, place, and time.    Breast exam: no palpable masses in the right breast. No palpable bilateral axillary adenopathy. She is s/p left mastectomy without reconstruction and no evidence of chest wall recurrence  I have personally reviewed labs listed below:    Latest Ref Rng & Units 02/19/2023    9:16 AM  CMP  Glucose 70 - 99 mg/dL 295   BUN 8 - 23 mg/dL 17   Creatinine 2.84 - 1.00 mg/dL 1.32   Sodium 440 - 102 mmol/L 135   Potassium 3.5 - 5.1 mmol/L 3.9   Chloride 98 - 111 mmol/L 98   CO2 22 - 32 mmol/L 28   Calcium 8.9 - 10.3 mg/dL 9.5   Total Protein 6.5 - 8.1 g/dL 7.7   Total Bilirubin 0.3 - 1.2 mg/dL 0.3   Alkaline Phos 38 - 126 U/L 72   AST 15 -  41 U/L 26   ALT 0 - 44 U/L 17       Latest Ref Rng &  Units 02/19/2023    9:16 AM  CBC  WBC 4.0 - 10.5 K/uL 10.6   Hemoglobin 12.0 - 15.0 g/dL 16.1   Hematocrit 09.6 - 46.0 % 33.1   Platelets 150 - 400 K/uL 440      Assessment and plan- Patient is a 73 y.o. female with history of stage I left breast cancer ER/PR positive HER2 negative s/p left mastectomy adjuvant chemotherapy and radiation treatment.  She is presently on Arimidex and this is a routine follow-up visit.   Clinically patient is doing well with no concerning signs and symptoms of recurrence based on today's exam.  She has completed 4 years of endocrine therapy.  Repeat bone density scan was normal.  Given that she had a high risk MammaPrint score I recommend 10 years of endocrine therapy if she can tolerate it.  I will see her back in 6 months no labs.  I will order her mammogram for June 2024.   Visit Diagnosis 1. Encounter for follow-up surveillance of breast cancer   2. History of breast cancer   3. Encounter for screening mammogram for malignant neoplasm of breast   4. High risk medication use   5. Visit for monitoring Arimidex therapy      Dr. Owens Shark, MD, MPH [facility] at [facility] [phone] 02/22/2024 12:21 PM

## 2024-05-15 ENCOUNTER — Ambulatory Visit
Admission: RE | Admit: 2024-05-15 | Discharge: 2024-05-15 | Disposition: A | Discharge status: Home / Self Care | Source: Ambulatory Visit | Attending: Oncology | Admitting: Oncology

## 2024-05-15 DIAGNOSIS — Z1231 Encounter for screening mammogram for malignant neoplasm of breast: Secondary | ICD-10-CM | POA: Diagnosis present

## 2024-08-20 ENCOUNTER — Encounter (INDEPENDENT_AMBULATORY_CARE_PROVIDER_SITE_OTHER): Payer: Self-pay

## 2024-08-23 ENCOUNTER — Encounter: Payer: Self-pay | Admitting: Oncology

## 2024-08-23 ENCOUNTER — Inpatient Hospital Stay: Attending: Oncology | Admitting: Oncology

## 2024-08-23 VITALS — BP 93/65 | HR 77 | Temp 96.8°F | Resp 20 | Ht 61.0 in | Wt 192.7 lb

## 2024-08-23 DIAGNOSIS — Z1721 Progesterone receptor positive status: Secondary | ICD-10-CM | POA: Insufficient documentation

## 2024-08-23 DIAGNOSIS — C50412 Malignant neoplasm of upper-outer quadrant of left female breast: Secondary | ICD-10-CM | POA: Insufficient documentation

## 2024-08-23 DIAGNOSIS — Z79811 Long term (current) use of aromatase inhibitors: Secondary | ICD-10-CM | POA: Insufficient documentation

## 2024-08-23 DIAGNOSIS — Z170 Estrogen receptor positive status [ER+]: Secondary | ICD-10-CM | POA: Diagnosis not present

## 2024-08-23 DIAGNOSIS — Z853 Personal history of malignant neoplasm of breast: Secondary | ICD-10-CM

## 2024-08-23 DIAGNOSIS — Z9221 Personal history of antineoplastic chemotherapy: Secondary | ICD-10-CM | POA: Diagnosis not present

## 2024-08-23 DIAGNOSIS — Z1732 Human epidermal growth factor receptor 2 negative status: Secondary | ICD-10-CM | POA: Diagnosis not present

## 2024-08-23 DIAGNOSIS — Z9012 Acquired absence of left breast and nipple: Secondary | ICD-10-CM | POA: Diagnosis not present

## 2024-08-23 DIAGNOSIS — Z79899 Other long term (current) drug therapy: Secondary | ICD-10-CM

## 2024-08-23 DIAGNOSIS — Z5181 Encounter for therapeutic drug level monitoring: Secondary | ICD-10-CM

## 2024-08-23 DIAGNOSIS — Z923 Personal history of irradiation: Secondary | ICD-10-CM | POA: Insufficient documentation

## 2024-08-23 NOTE — Progress Notes (Signed)
 Patient doing well with no new or acute concerns at this time.

## 2024-08-23 NOTE — Progress Notes (Signed)
 Hematology/Oncology Consult note [facility]  [phone] [phone]  Patient Care Team: Amanda Alm SQUIBB, Wade as PCP - General (Infectious Diseases) Amanda Annah BROCKS, Wade as Consulting Physician (Oncology) Dessa, Reyes ORN, Wade as Consulting Physician (General Surgery) Amanda Jesusa HERO, RN as Oncology Nurse Navigator Amanda Wade as Referring Physician (Radiation Oncology)   Name of the patient: Amanda Wade  [id number]  1951/07/08   Date of visit: 08/23/24  Diagnosis-  pathological prognostic stage IA [id number] [id number] Invasive mammary carcinoma of the left breast     Chief complaint/ Reason for visit-routine follow-up visit for breast cancer  Heme/Onc history:  Patient is a 73 year old female diagnosed with stage I left breast cancer in June 2020.  She was found to have multifocal disease grade 1 ER/PR greater than 90% positive and HER2 negative.Patient was taken for lumpectomy and sentinel lymph node biopsy by Dr. Dessa on 06/05/2019.Final pathology showed invasive mammary carcinoma grade 1 ER PR positive and HER-2/neu negative at 4 different biopsy sites with extensive DCIS predominantly micropapillary type.  One lymph node was also involved with macro metastatic carcinoma and was negative for extranodal extension. Patient's case was discussed at tumor board and there was a concern that there may be potentially more areas of abnormality given extensive DCIS and invasive cancer seen in multiple foci in the lumpectomy specimen.  Plan was to proceed with bilateral MRI.Breast MRI showed a large area of clumped discontinued non-mass enhancement in the left upper breast spanning an area of 9.8 cm in AP dimension suspicious for residual DCIS or invasive carcinoma.  Patient therefore proceeded with r left mastectomy which showed small area of residual DCIS but no evidence of invasive malignancy. Patient also had a MammaPrint testing done on her  lumpectomy specimen which came back as high risk group.  29% chance of recurrence without chemotherapy at 5 years.  Greater than 12% chemo benefit and estimated > 93% survival at 5 years with chemotherapy and hormone therapy patient received adjuvant TC chemotherapy as well as adjuvant radiation treatment.  She started taking Arimidex  in February 2021. SABRA  She also completed adjuvant Zometa      Interval history-patient is tolerating Arimidex  well along with calcium and vitamin D.  She denies any breast concerns.  She denies any changes in her appetite or weight.  ECOG PS- 1 Pain scale- 0   Review of systems- Review of Systems  Constitutional:  Negative for chills, fever, malaise/fatigue and weight loss.  HENT:  Negative for congestion, ear discharge and nosebleeds.   Eyes:  Negative for blurred vision.  Respiratory:  Negative for cough, hemoptysis, sputum production, shortness of breath and wheezing.   Cardiovascular:  Negative for chest pain, palpitations, orthopnea and claudication.  Gastrointestinal:  Negative for abdominal pain, blood in stool, constipation, diarrhea, heartburn, melena, nausea and vomiting.  Genitourinary:  Negative for dysuria, flank pain, frequency, hematuria and urgency.  Musculoskeletal:  Negative for back pain, joint pain and myalgias.  Skin:  Negative for rash.  Neurological:  Negative for dizziness, tingling, focal weakness, seizures, weakness and headaches.  Endo/Heme/Allergies:  Does not bruise/bleed easily.  Psychiatric/Behavioral:  Negative for depression and suicidal ideas. The patient does not have insomnia.       Allergies  Allergen Reactions   Shellfish Allergy Rash   Sulfa Antibiotics Rash     Past Medical History:  Diagnosis Date   Breast cancer (HCC)    left breast   Chronic renal insufficiency, stage  2 (mild) 04/08/2017   EGFR 55 on 04/08/2017, 62 10/13/2018   Complication of anesthesia    Elevated LFTs 08/07/2021   Headache     Hyperlipidemia    Hypertension    Malignant neoplasm of upper-outer quadrant of left breast in female, estrogen receptor positive (HCC) 05/19/2019   Personal history of chemotherapy    Personal history of radiation therapy    PONV (postoperative nausea and vomiting) 1987   Pre-diabetes    Severe obesity with body mass index (BMI) of 35.0 to 39.9 with comorbidity (HCC) 10/11/2015   Trigger finger of right hand    and thumb   Vertigo    intermittent especially lying on left side     Past Surgical History:  Procedure Laterality Date   ABDOMINAL HYSTERECTOMY  2004   complete   BREAST BIOPSY Left 05/05/2019   [facility]- ribbon clip   BREAST BIOPSY Left 05/16/2019   us -DCIS   BREAST BIOPSY Left 05/16/2019   [facility]- X clip   BREAST BIOPSY WITH SENTINEL LYMPH NODE BIOPSY AND NEEDLE LOCALIZATION Left 06/05/2019   Procedure: BREAST BIOPSY WITH SENTINEL LYMPH NODE BIOPSY AND NEEDLE LOCALIZATION, LEFT, BRACKETING WIRE;  Surgeon: Dessa Reyes ORN, Wade;  Location: [facility] ORS;  Service: General;  Laterality: Left;   BREAST CYST ASPIRATION Bilateral age 11   neg   BREAST LUMPECTOMY Left 05/2019   positive   CHOLECYSTECTOMY     COLONOSCOPY WITH PROPOFOL  N/A 08/02/2018   Procedure: COLONOSCOPY WITH PROPOFOL ;  Surgeon: Gaylyn Gladis PENNER, Wade;  Location: [facility] ENDOSCOPY;  Service: Endoscopy;  Laterality: N/A;   COLONOSCOPY WITH PROPOFOL  N/A 12/01/2021   Procedure: COLONOSCOPY WITH PROPOFOL ;  Surgeon: Unk Corinn Skiff, Wade;  Location: [facility] ENDOSCOPY;  Service: Gastroenterology;  Laterality: N/A;   DILATION AND CURETTAGE OF UTERUS     goiter surgery     MASTECTOMY Left 07/10/2019   OOPHORECTOMY     PORTACATH PLACEMENT Left 07/10/2019   Procedure: INSERTION PORT-A-CATH;  Surgeon: Jordis Laneta FALCON, Wade;  Location: [facility] ORS;  Service: General;  Laterality: Left;   SIMPLE MASTECTOMY WITH AXILLARY SENTINEL NODE BIOPSY Left 07/10/2019   Procedure: SIMPLE MASTECTOMY.; LEFT;  Surgeon: Jordis Laneta FALCON, Wade;  Location: [facility]  ORS;  Service: General;  Laterality: Left;   THYROIDECTOMY, PARTIAL     TRIGGER FINGER RELEASE     rigt hand thumb and pointer finger    Social History   Socioeconomic History   Marital status: Divorced    Spouse name: Not on file   Number of children: Not on file   Years of education: Not on file   Highest education level: Not on file  Occupational History   Not on file  Tobacco Use   Smoking status: Never   Smokeless tobacco: Never  Vaping Use   Vaping status: Never Used  Substance and Sexual Activity   Alcohol use: Not Currently   Drug use: No   Sexual activity: Not Currently  Other Topics Concern   Not on file  Social History Narrative   Not on file   Social Drivers of Health   Financial Resource Strain: Low Risk  (03/17/2024)   Received from [facility]   Overall Financial Resource Strain (CARDIA)    Difficulty of Paying Living Expenses: Not hard at all  Food Insecurity: No Food Insecurity (03/17/2024)   Received from [facility]   Hunger Vital Sign    Within the past 12 months, you worried that your food would run  out before you got the money to buy more.: Never true    Within the past 12 months, the food you bought just didn't last and you didn't have money to get more.: Never true  Transportation Needs: No Transportation Needs (03/17/2024)   Received from [facility] - Transportation    In the past 12 months, has lack of transportation kept you from medical appointments or from getting medications?: No    Lack of Transportation (Non-Medical): No  Physical Activity: Not on file  Stress: Not on file  Social Connections: Not on file  Intimate Partner Violence: Not on file    Family History  Problem Relation Age of Onset   Breast cancer Cousin        mat cousin   Hypertension Mother    Heart attack Father    Prostate cancer Father    Diabetes Father    Colon polyps Father      Current  Outpatient Medications:    acetaminophen  (TYLENOL ) 500 MG tablet, Take 500 mg by mouth every 6 (six) hours as needed (for pain/headaches.)., Disp: , Rfl:    anastrozole  (ARIMIDEX ) 1 MG tablet, TAKE 1 TABLET BY MOUTH EVERY DAY, Disp: 90 tablet, Rfl: 3   aspirin EC 81 MG tablet, Take 81 mg by mouth daily., Disp: , Rfl:    atorvastatin (LIPITOR) 10 MG tablet, Take 10 mg by mouth daily. , Disp: , Rfl:    Calcium Carbonate-Vitamin D 600-400 MG-UNIT tablet, Take 2 tablets by mouth daily. , Disp: , Rfl:    Dupilumab (DUPIXENT) 300 MG/2ML SOPN, Inject 300 mg into the skin., Disp: , Rfl:    lisinopril-hydrochlorothiazide (PRINZIDE,ZESTORETIC) 20-12.5 MG tablet, Take 1 tablet by mouth daily., Disp: , Rfl:    metFORMIN (GLUCOPHAGE-XR) 750 MG 24 hr tablet, Take 1,500 mg by mouth daily., Disp: , Rfl:    Multiple Vitamin (MULTIVITAMIN WITH MINERALS) TABS tablet, Take 1 tablet by mouth daily after breakfast. Centrum Silver, Disp: , Rfl:   Physical exam:  Vitals:   08/23/24 1136  BP: 93/65  Pulse: 77  Resp: 20  Temp: (!) 96.8 F (36 C)  TempSrc: Tympanic  SpO2: 97%  Weight: 192 lb 11.2 oz (87.4 kg)  Height: 5' 1 (1.549 m)   Physical Exam Cardiovascular:     Rate and Rhythm: Normal rate and regular rhythm.     Heart sounds: Normal heart sounds.  Pulmonary:     Effort: Pulmonary effort is normal.     Breath sounds: Normal breath sounds.  Skin:    General: Skin is warm and dry.  Neurological:     Mental Status: She is alert and oriented to person, place, and time.   Breast exam: No palpable masses in the right breast.  No palpable bilateral axillary adenopathy.  Patient is status post left mastectomy without reconstruction.  No evidence of chest wall recurrence  I have personally reviewed labs listed below:    Latest Ref Rng & Units 02/19/2023    9:16 AM  CMP  Glucose 70 - 99 mg/dL 856   BUN 8 - 23 mg/dL 17   Creatinine 9.55 - 1.00 mg/dL 9.01   Sodium 864 - 854 mmol/L 135   Potassium 3.5  - 5.1 mmol/L 3.9   Chloride 98 - 111 mmol/L 98   CO2 22 - 32 mmol/L 28   Calcium 8.9 - 10.3 mg/dL 9.5   Total Protein 6.5 - 8.1 g/dL 7.7   Total Bilirubin 0.3 -  1.2 mg/dL 0.3   Alkaline Phos 38 - 126 U/L 72   AST 15 - 41 U/L 26   ALT 0 - 44 U/L 17       Latest Ref Rng & Units 02/19/2023    9:16 AM  CBC  WBC 4.0 - 10.5 K/uL 10.6   Hemoglobin 12.0 - 15.0 g/dL 88.5   Hematocrit 63.9 - 46.0 % 33.1   Platelets 150 - 400 K/uL 440      Assessment and plan- Patient is a 73 y.o. female with history of stage I left breast cancer ER/PR positive HER2 negative s/p left mastectomy adjuvant chemotherapy and radiation treatment.  She is here for routine follow-up of breast cancer on Arimidex   Clinically patient is doing well with no concerning signs and symptoms of recurrence based on today's exam.  Patient had high risk MammaPrint score and therefore I would recommend adjuvant endocrine therapy for 10 years.  She completes 5 years in February 2026.  I will see her back in 6 months no labs and following that I will consider seeing her on a yearly basis.  Last bone density scan from December 2024 was normal   Visit Diagnosis 1. Encounter for follow-up surveillance of breast cancer   2. High risk medication use   3. Visit for monitoring Arimidex  therapy      Dr. Annah Skene, Wade, MPH [facility] [phone] 08/23/2024 12:40 PM

## 2024-11-19 ENCOUNTER — Other Ambulatory Visit: Payer: Self-pay | Admitting: Oncology

## 2024-11-20 ENCOUNTER — Encounter: Payer: Self-pay | Admitting: Oncology

## 2025-02-21 ENCOUNTER — Ambulatory Visit: Admitting: Oncology
